# Patient Record
Sex: Male | Born: 1975 | Race: White | Hispanic: No | Marital: Married | Smoking: Former smoker
Health system: Southern US, Community
[De-identification: ages and names within clinical notes are randomized; demographics above are authoritative.]

## PROBLEM LIST (undated history)

## (undated) DIAGNOSIS — E669 Obesity, unspecified: Secondary | ICD-10-CM

## (undated) DIAGNOSIS — K219 Gastro-esophageal reflux disease without esophagitis: Secondary | ICD-10-CM

## (undated) DIAGNOSIS — M779 Enthesopathy, unspecified: Secondary | ICD-10-CM

## (undated) DIAGNOSIS — E119 Type 2 diabetes mellitus without complications: Secondary | ICD-10-CM

## (undated) DIAGNOSIS — S46012A Strain of muscle(s) and tendon(s) of the rotator cuff of left shoulder, initial encounter: Secondary | ICD-10-CM

## (undated) DIAGNOSIS — F419 Anxiety disorder, unspecified: Secondary | ICD-10-CM

## (undated) DIAGNOSIS — M722 Plantar fascial fibromatosis: Secondary | ICD-10-CM

## (undated) DIAGNOSIS — L989 Disorder of the skin and subcutaneous tissue, unspecified: Secondary | ICD-10-CM

## (undated) DIAGNOSIS — I10 Essential (primary) hypertension: Secondary | ICD-10-CM

## (undated) HISTORY — DX: Enthesopathy, unspecified: M77.9

## (undated) HISTORY — PX: VASECTOMY: SHX75

## (undated) HISTORY — DX: Anxiety disorder, unspecified: F41.9

## (undated) HISTORY — DX: Gastro-esophageal reflux disease without esophagitis: K21.9

## (undated) HISTORY — DX: Disorder of the skin and subcutaneous tissue, unspecified: L98.9

## (undated) HISTORY — DX: Plantar fascial fibromatosis: M72.2

## (undated) HISTORY — DX: Obesity, unspecified: E66.9

---

## 1999-05-21 ENCOUNTER — Emergency Department (HOSPITAL_COMMUNITY): Admission: EM | Admit: 1999-05-21 | Discharge: 1999-05-21 | Payer: Self-pay | Admitting: Emergency Medicine

## 1999-05-25 ENCOUNTER — Emergency Department (HOSPITAL_COMMUNITY): Admission: EM | Admit: 1999-05-25 | Discharge: 1999-05-25 | Payer: Self-pay | Admitting: Emergency Medicine

## 2000-12-09 ENCOUNTER — Emergency Department (HOSPITAL_COMMUNITY): Admission: EM | Admit: 2000-12-09 | Discharge: 2000-12-09 | Payer: Self-pay | Admitting: Emergency Medicine

## 2001-03-28 ENCOUNTER — Emergency Department (HOSPITAL_COMMUNITY): Admission: EM | Admit: 2001-03-28 | Discharge: 2001-03-28 | Payer: Self-pay

## 2004-04-23 ENCOUNTER — Emergency Department (HOSPITAL_COMMUNITY): Admission: EM | Admit: 2004-04-23 | Discharge: 2004-04-23 | Payer: Self-pay | Admitting: Emergency Medicine

## 2005-05-03 ENCOUNTER — Emergency Department (HOSPITAL_COMMUNITY): Admission: EM | Admit: 2005-05-03 | Discharge: 2005-05-03 | Payer: Self-pay | Admitting: Emergency Medicine

## 2011-08-25 ENCOUNTER — Emergency Department: Payer: Self-pay | Admitting: *Deleted

## 2012-08-03 ENCOUNTER — Ambulatory Visit: Payer: Self-pay | Admitting: *Deleted

## 2012-08-21 ENCOUNTER — Ambulatory Visit: Payer: Self-pay | Admitting: Dietician

## 2013-11-13 ENCOUNTER — Emergency Department: Payer: Self-pay | Admitting: Internal Medicine

## 2013-11-13 LAB — BASIC METABOLIC PANEL
Anion Gap: 9 (ref 7–16)
BUN: 13 mg/dL (ref 7–18)
Calcium, Total: 8.5 mg/dL (ref 8.5–10.1)
Chloride: 107 mmol/L (ref 98–107)
Co2: 25 mmol/L (ref 21–32)
Creatinine: 1.14 mg/dL (ref 0.60–1.30)
EGFR (African American): >60
EGFR (Non-African Amer.): >60
Glucose: 154 mg/dL — ABNORMAL HIGH (ref 65–99)
Osmolality: 284 (ref 275–301)
Potassium: 3.6 mmol/L (ref 3.5–5.1)
Sodium: 141 mmol/L (ref 136–145)

## 2013-11-13 LAB — CBC
HCT: 44.6 % (ref 40.0–52.0)
HGB: 14.8 g/dL (ref 13.0–18.0)
MCH: 30.6 pg (ref 26.0–34.0)
MCHC: 33.2 g/dL (ref 32.0–36.0)
MCV: 92 fL (ref 80–100)
Platelet: 332 10*3/uL (ref 150–440)
RBC: 4.83 10*6/uL (ref 4.40–5.90)
RDW: 13.4 % (ref 11.5–14.5)
WBC: 9.3 10*3/uL (ref 3.8–10.6)

## 2013-11-13 LAB — TROPONIN I: Troponin-I: <0.02 ng/mL

## 2014-03-05 ENCOUNTER — Encounter: Payer: Self-pay | Admitting: *Deleted

## 2014-07-23 ENCOUNTER — Telehealth: Payer: Self-pay | Admitting: Cardiology

## 2014-07-23 NOTE — Telephone Encounter (Signed)
Received records from [facility] [place] for appointment on 07/25/14 with Dr Antoine PocheHochrein.  [facility] for Dr Hochrein's schedule on 7/716. lp

## 2014-07-25 ENCOUNTER — Encounter: Payer: Self-pay | Admitting: Cardiology

## 2014-07-25 ENCOUNTER — Ambulatory Visit (INDEPENDENT_AMBULATORY_CARE_PROVIDER_SITE_OTHER): Payer: 59 | Admitting: Cardiology

## 2014-07-25 VITALS — BP 120/94 | HR 80 | Ht 73.0 in | Wt 281.0 lb

## 2014-07-25 DIAGNOSIS — Z8241 Family history of sudden cardiac death: Secondary | ICD-10-CM | POA: Insufficient documentation

## 2014-07-25 DIAGNOSIS — Z8249 Family history of ischemic heart disease and other diseases of the circulatory system: Secondary | ICD-10-CM

## 2014-07-25 DIAGNOSIS — I422 Other hypertrophic cardiomyopathy: Secondary | ICD-10-CM

## 2014-07-25 NOTE — Patient Instructions (Signed)
Your physician wants you to follow-up in: 2 Years. You will receive a reminder letter in the mail two months in advance. If you don't receive a letter, please call our office to schedule the follow-up appointment.  Your physician has requested that you have an echocardiogram. Echocardiography is a painless test that uses sound waves to create images of your heart. It provides your doctor with information about the size and shape of your heart and how well your heart's chambers and valves are working. This procedure takes approximately one hour. There are no restrictions for this procedure.

## 2014-07-25 NOTE — Progress Notes (Signed)
Cardiology Office Note   Date:  07/25/2014   ID:  [name] BlowerJeffrey D [name], DOB Jul 17, 1975, MRN [id number]  PCP:  [name] CopasHACKER,[name] KELLER, MD  Cardiologist:   Rollene RotundaJames Taysean Wager, MD   Chief Complaint  Patient presents with  . Family History of HCM    c/o      History of Present Illness: [name] [name] is a 39 y.o. male who presents for evaluation because his son who was 39 years old at the time died suddenly was found to have hypertrophic cardiomyopathy. He has never been screened. His other 3 children have been screened as has his wife. He otherwise is well. He denies any cardiovascular symptoms. He [place] a very active outside [place]. The patient denies any new symptoms such as chest discomfort, neck or arm discomfort. There has been no new shortness of breath, PND or orthopnea. There have been no reported palpitations, presyncope or syncope.  Past Medical History  Diagnosis Date  . Anxiety   . Tendonitis   . GERD (gastroesophageal reflux disease)   . Obesity (BMI 30-39.9)   . Skin lesion   . Plantar fasciitis of right foot     Past Surgical History  Procedure Laterality Date  . Vasectomy       Current Outpatient Prescriptions  Medication Sig Dispense Refill  . sertraline (ZOLOFT) 100 MG tablet Take 200 mg by mouth daily.     No current facility-administered medications for this visit.    Allergies:   Review of patient's allergies indicates not on file.    Social History:  The patient  reports that he has been smoking Cigarettes.  His smokeless tobacco use includes Snuff.   Family History:  The patient's family history includes Diabetes type I in his son; Hypertrophic cardiomyopathy in his son.    ROS:  Please see the history of present illness.   Otherwise, review of systems are positive for none.   All other systems are reviewed and negative.    PHYSICAL EXAM: VS:  BP 120/94 mmHg  Pulse 80  Ht 6\' 1"  (1.854 m)  Wt 281 lb (127.461 kg)  BMI 37.08  kg/m2 , BMI Body mass index is 37.08 kg/(m^2). GENERAL:  Well appearing HEENT:  Pupils equal round and reactive, fundi not visualized, oral mucosa unremarkable NECK:  No jugular venous distention, waveform within normal limits, carotid upstroke brisk and symmetric, no bruits, no thyromegaly LYMPHATICS:  No cervical, inguinal adenopathy LUNGS:  Clear to auscultation bilaterally BACK:  No CVA tenderness CHEST:  Unremarkable HEART:  PMI not displaced or sustained,S1 and S2 within normal limits, no S3, no S4, no clicks, no rubs, no murmurs ABD:  Flat, positive bowel sounds normal in frequency in pitch, no bruits, no rebound, no guarding, no midline pulsatile mass, no hepatomegaly, no splenomegaly EXT:  2 plus pulses throughout, no edema, no cyanosis no clubbing SKIN:  No rashes no nodules NEURO:  Cranial nerves II through XII grossly intact, motor grossly intact throughout PSYCH:  Cognitively intact, oriented to person place and time    EKG:  EKG is not ordered today. The ekg ordered 11/13/13 day demonstrates Sinus rhythm, rate 75, axis within normal limits, intervals within normal limits, no acute ST-T wave changes.    Recent Labs: 11/13/2013: BUN 13; Creatinine 1.14; HGB 14.8; Platelet 332; Potassium 3.6; Sodium 141    Lipid Panel No results found for: CHOL, TRIG, HDL, CHOLHDL, VLDL, LDLCALC, LDLDIRECT    Wt Readings from Last  3 Encounters:  07/25/14 281 lb (127.461 kg)      Other studies Reviewed: Additional studies/ records that were reviewed today include: EKG. Review of the above records demonstrates:  Please see elsewhere in the note.     ASSESSMENT AND PLAN:  FAMILY HISTORY OF HCM:  I will screen the patient with an echocardiogram. Further send the patient for genetic testing.  OVERWEIGHT:  The patient understands the need to lose weight with diet and exercise. We have discussed specific strategies for this.  Current medicines are reviewed at length with the patient  today.  The patient does not have concerns regarding medicines.  The following changes have been made:  no change  Labs/ tests ordered today include:   Orders Placed This Encounter  Procedures  . ECHOCARDIOGRAM COMPLETE     Disposition:   FU with me in two years.     Signed, Rollene Rotunda, MD  07/25/2014 11:52 AM     [facility]

## 2014-07-29 ENCOUNTER — Encounter: Payer: Self-pay | Admitting: Cardiology

## 2014-07-31 ENCOUNTER — Ambulatory Visit (HOSPITAL_COMMUNITY)
Admission: RE | Admit: 2014-07-31 | Discharge: 2014-07-31 | Disposition: A | Discharge status: Home / Self Care | Payer: 59 | Source: Ambulatory Visit | Attending: Cardiology | Admitting: Cardiology

## 2014-07-31 DIAGNOSIS — I351 Nonrheumatic aortic (valve) insufficiency: Secondary | ICD-10-CM | POA: Insufficient documentation

## 2014-07-31 DIAGNOSIS — Z8249 Family history of ischemic heart disease and other diseases of the circulatory system: Secondary | ICD-10-CM | POA: Diagnosis present

## 2014-07-31 DIAGNOSIS — I422 Other hypertrophic cardiomyopathy: Secondary | ICD-10-CM

## 2014-08-07 ENCOUNTER — Telehealth: Payer: Self-pay | Admitting: Cardiology

## 2014-08-07 NOTE — Telephone Encounter (Signed)
Pt called in wanting to know if his results from the Echo he had done on 7/13 were available. Please call  Thanks

## 2014-08-07 NOTE — Telephone Encounter (Signed)
Notified patient of results via VM (OK per DPR) Results routed to PCP  [facility]?

## 2014-08-08 ENCOUNTER — Telehealth: Payer: Self-pay | Admitting: Cardiology

## 2014-08-08 NOTE — Telephone Encounter (Signed)
Error

## 2014-09-08 ENCOUNTER — Telehealth: Payer: Self-pay | Admitting: Cardiology

## 2014-09-08 NOTE — Telephone Encounter (Signed)
Error

## 2014-09-18 ENCOUNTER — Telehealth: Payer: Self-pay | Admitting: *Deleted

## 2014-09-18 NOTE — Telephone Encounter (Signed)
Open chart by accident; wrong patient

## 2014-10-11 ENCOUNTER — Telehealth: Payer: Self-pay | Admitting: Cardiology

## 2014-10-11 NOTE — Telephone Encounter (Signed)
Error

## 2014-12-16 ENCOUNTER — Telehealth: Payer: Self-pay | Admitting: *Deleted

## 2014-12-16 NOTE — Telephone Encounter (Signed)
Dr. Antoine PocheHochrein and I discussed this patient case today. Pt had family member (biological son) die suddenly, a postmortem exam found hypertrophic cardiomyopathy. Pt himself was evaluated. Dr. Nolon RodAndrew Wang w/ [facility] has given pt recommendations. Dr. Antoine PocheHochrein had requested me to follow up on this patient regarding Dr. Juanda ChanceWang's recommendations of annual EKG and echocardiograms for his living children, and same testing every 5 years for the patient and his wife. He wants pt to be aware that we are happy to provide this service for him and his children at the recommended intervals.  I called patient and did not receive answer at either listed home number. Left message at indicated number for patient to call.

## 2014-12-19 NOTE — Telephone Encounter (Signed)
Left msg for patient to call. 

## 2014-12-20 NOTE — Telephone Encounter (Signed)
Spoke to patient. He acknowledges our offer to provided services, states children and wife are already set up w/ other practice for recommended testing. Advised to call if any future needs.

## 2015-03-11 ENCOUNTER — Telehealth: Payer: Self-pay | Admitting: Cardiology

## 2015-03-11 NOTE — Telephone Encounter (Signed)
Pt's wife is calling you back   Thanks

## 2015-03-11 NOTE — Telephone Encounter (Signed)
New message     Youngest son was diagnosed with a genetic heart problem. He had to have a defibulator implanted at 40yrs old. He has a cardiomyopathy problem.  The pediatric cardiologist at [facility] is recommending parents to have an MRI to see which parent is the "carrier".  Is this ok with Dr Antoine Poche for pt to have an MRI?  OK to leave message on vm

## 2015-03-11 NOTE — Telephone Encounter (Signed)
LMTC

## 2015-03-11 NOTE — Telephone Encounter (Signed)
Left message advising pt should be OK to have MRI, would confirm w/ Dr. Antoine Poche & call back w/ advice.

## 2015-03-11 NOTE — Telephone Encounter (Signed)
This was not in the recommendation from Dr. Regino Schultze at the [facility] clinic where I sent this patient to follow up on just this issue.  Dr. Regino Schultze did not mention an MRI.  He did mention echo and EKG q 5 years.  Please ask the patient to call [facility] and Dr. Juanda Chance office to follow up on this recommendation.

## 2015-03-11 NOTE — Telephone Encounter (Signed)
Left [name] a message to call.

## 2015-03-11 NOTE — Telephone Encounter (Signed)
Left msg for wife, left msg for patient, that Dr. Antoine Poche recommends inquiring on this w/ [facility] cardiologist. Requested call back.

## 2016-01-27 DIAGNOSIS — J029 Acute pharyngitis, unspecified: Secondary | ICD-10-CM | POA: Diagnosis not present

## 2016-01-27 DIAGNOSIS — J209 Acute bronchitis, unspecified: Secondary | ICD-10-CM | POA: Diagnosis not present

## 2016-03-19 DIAGNOSIS — M25512 Pain in left shoulder: Secondary | ICD-10-CM | POA: Diagnosis not present

## 2016-03-19 DIAGNOSIS — M79642 Pain in left hand: Secondary | ICD-10-CM | POA: Diagnosis not present

## 2016-04-21 ENCOUNTER — Encounter (HOSPITAL_COMMUNITY): Payer: Self-pay | Admitting: Emergency Medicine

## 2016-04-21 ENCOUNTER — Emergency Department (HOSPITAL_COMMUNITY)
Admission: EM | Admit: 2016-04-21 | Discharge: 2016-04-21 | Disposition: A | Discharge status: Home / Self Care | Payer: Commercial Managed Care - HMO | Attending: Emergency Medicine | Admitting: Emergency Medicine

## 2016-04-21 ENCOUNTER — Emergency Department (HOSPITAL_COMMUNITY): Payer: Commercial Managed Care - HMO

## 2016-04-21 DIAGNOSIS — S46912A Strain of unspecified muscle, fascia and tendon at shoulder and upper arm level, left arm, initial encounter: Secondary | ICD-10-CM

## 2016-04-21 DIAGNOSIS — S46911A Strain of unspecified muscle, fascia and tendon at shoulder and upper arm level, right arm, initial encounter: Secondary | ICD-10-CM | POA: Diagnosis not present

## 2016-04-21 DIAGNOSIS — M62838 Other muscle spasm: Secondary | ICD-10-CM | POA: Diagnosis not present

## 2016-04-21 DIAGNOSIS — Z79899 Other long term (current) drug therapy: Secondary | ICD-10-CM | POA: Diagnosis not present

## 2016-04-21 DIAGNOSIS — Y939 Activity, unspecified: Secondary | ICD-10-CM | POA: Insufficient documentation

## 2016-04-21 DIAGNOSIS — S4992XA Unspecified injury of left shoulder and upper arm, initial encounter: Secondary | ICD-10-CM

## 2016-04-21 DIAGNOSIS — F17210 Nicotine dependence, cigarettes, uncomplicated: Secondary | ICD-10-CM | POA: Insufficient documentation

## 2016-04-21 DIAGNOSIS — M79602 Pain in left arm: Secondary | ICD-10-CM | POA: Diagnosis not present

## 2016-04-21 DIAGNOSIS — Y999 Unspecified external cause status: Secondary | ICD-10-CM | POA: Diagnosis not present

## 2016-04-21 DIAGNOSIS — W010XXA Fall on same level from slipping, tripping and stumbling without subsequent striking against object, initial encounter: Secondary | ICD-10-CM | POA: Insufficient documentation

## 2016-04-21 DIAGNOSIS — Y929 Unspecified place or not applicable: Secondary | ICD-10-CM | POA: Insufficient documentation

## 2016-04-21 DIAGNOSIS — S40912A Unspecified superficial injury of left shoulder, initial encounter: Secondary | ICD-10-CM | POA: Diagnosis not present

## 2016-04-21 MED ORDER — NAPROXEN 375 MG PO TABS: 375.0000 mg | ORAL_TABLET | Freq: Two times a day (BID) | ORAL | 0 refills | Status: DC

## 2016-04-21 MED ORDER — CYCLOBENZAPRINE HCL 10 MG PO TABS: 10.0000 mg | ORAL_TABLET | Freq: Every day | ORAL | 0 refills | Status: AC

## 2016-04-21 MED ORDER — OXYCODONE-ACETAMINOPHEN 5-325 MG PO TABS
1.0000 | ORAL_TABLET | Freq: Once | ORAL | Status: AC
  Administered 2016-04-21: 1 via ORAL

## 2016-04-21 MED ORDER — HYDROCODONE-ACETAMINOPHEN 5-325 MG PO TABS: 1.0000 | ORAL_TABLET | Freq: Three times a day (TID) | ORAL | 0 refills | Status: AC | PRN

## 2016-04-21 MED ORDER — ACETAMINOPHEN 500 MG PO TABS: 1000.0000 mg | ORAL_TABLET | Freq: Three times a day (TID) | ORAL | 0 refills | Status: AC

## 2016-04-21 MED ORDER — OXYCODONE-ACETAMINOPHEN 5-325 MG PO TABS: 1.0000 | ORAL_TABLET | Freq: Once | ORAL | Status: DC

## 2016-04-21 MED ORDER — OXYCODONE-ACETAMINOPHEN 5-325 MG PO TABS
ORAL_TABLET | ORAL | Status: AC
  Filled 2016-04-21: qty 1

## 2016-04-21 NOTE — ED Notes (Signed)
Patient states that entire left arm feels numb all the way to finger tips.  Radial pulse noted.

## 2016-04-21 NOTE — ED Triage Notes (Signed)
Patient missed his step slipped and fell backwards while using a weedeater this evening , denies LOC/ambulatory , reports pain at left shoulder joint with mild swelling , pain increases with movement .

## 2016-04-21 NOTE — ED Provider Notes (Addendum)
[facility]-EMERGENCY DEPT Provider Note   CSN: [id number] Arrival date & time: 04/21/16  1907   By signing my name below, I, Teofilo Pod, attest that this documentation has been prepared under the direction and in the presence of Drema Pry, MD.  Electronically Signed: Teofilo Pod, ED Scribe. 04/21/2016. 8:04 PM.   History   Chief Complaint Chief Complaint  Patient presents with  . Shoulder Injury   The history is provided by the patient. No language interpreter was used.   HPI Comments:  [name] [name] is a 40 y.o. male who presents to the Emergency Department s/p left shoulder injury. Pt reports that he was weed-whacking and fell backwards after slipping on a step, and braced his fall with his left hand and elbow. He complains of constant pain to the left shoulder since the fall that is worse with movement. He did not feel any dislocation. He denies any head injury or LOC. No alleviating factors noted. Pt denies pain elsewhere, and other associated symptoms.   Past Medical History:  Diagnosis Date  . Anxiety   . GERD (gastroesophageal reflux disease)   . Obesity (BMI 30-39.9)   . Plantar fasciitis of right foot   . Skin lesion   . Tendonitis     Patient Active Problem List   Diagnosis Date Noted  . Family history of hypertrophic cardiomyopathy 08-19-2014  . Family history of sudden cardiac death in son Aug 19, 2014    Past Surgical History:  Procedure Laterality Date  . VASECTOMY         Home Medications    Prior to Admission medications   Medication Sig Start Date End Date Taking? Authorizing Provider  acetaminophen (TYLENOL) 500 MG tablet Take 2 tablets (1,000 mg total) by mouth every 8 (eight) hours. Do not take more than 4000 mg of acetaminophen (Tylenol) in a 24-hour period. Please note that other medicines that you may be prescribed may have Tylenol as well. 04/21/16 04/26/16  Nira Conn, MD  cyclobenzaprine (FLEXERIL) 10 MG tablet Take 1  tablet (10 mg total) by mouth at bedtime. 04/21/16 05/01/16  Nira Conn, MD  HYDROcodone-acetaminophen (NORCO/VICODIN) 5-325 MG tablet Take 1 tablet by mouth every 8 (eight) hours as needed for severe pain (That is not improved by your scheduled acetaminophen regimen). Please do not exceed 4000 mg of acetaminophen (Tylenol) a 24-hour period. Please note that he may be prescribed additional medicine that contains acetaminophen. 04/21/16 04/26/16  Nira Conn, MD  naproxen (NAPROSYN) 375 MG tablet Take 1 tablet (375 mg total) by mouth 2 (two) times daily. 04/21/16   Nira Conn, MD  sertraline (ZOLOFT) 100 MG tablet Take 200 mg by mouth daily.    Historical Provider, MD    Family History Family History  Problem Relation Age of Onset  . Hypertrophic cardiomyopathy Son   . Diabetes type I Son     Social History Social History  Substance Use Topics  . Smoking status: Current Some Day Smoker    Types: Cigarettes  . Smokeless tobacco: Current User    Types: Snuff  . Alcohol use Not on file     Allergies   Patient has no known allergies.   Review of Systems Review of Systems 10 Systems reviewed and are negative for acute change except as noted in the HPI.   Physical Exam Updated Vital Signs BP (!) 136/95 (BP Location: Right Arm)   Pulse (!) 106   Temp 97.9 F (36.6 C) (Oral)  Resp 16   SpO2 96%   Physical Exam  Constitutional: He is oriented to person, place, and time. He appears well-developed and well-nourished. No distress.  HENT:  Head: Normocephalic.  Right Ear: External ear normal.  Left Ear: External ear normal.  Mouth/Throat: Oropharynx is clear and moist.  Eyes: Conjunctivae and EOM are normal. Pupils are equal, round, and reactive to light. Right eye exhibits no discharge. Left eye exhibits no discharge. No scleral icterus.  Neck: Normal range of motion. Neck supple.  Cardiovascular: Regular rhythm and normal heart sounds.  Exam reveals no  gallop and no friction rub.   No murmur heard. Pulses:      Radial pulses are 2+ on the right side, and 2+ on the left side.       Dorsalis pedis pulses are 2+ on the right side, and 2+ on the left side.  Pulmonary/Chest: Effort normal and breath sounds normal. No stridor. No respiratory distress.  Abdominal: Soft. He exhibits no distension. There is no tenderness.  Musculoskeletal:       Left shoulder: He exhibits decreased range of motion (due to pain), tenderness (over deltoid) and spasm (of deltoid muscle). He exhibits no bony tenderness, no deformity, normal pulse and normal strength.       Cervical back: He exhibits no bony tenderness.       Thoracic back: He exhibits no bony tenderness.       Lumbar back: He exhibits no bony tenderness.  Clavicle stable. Chest stable to AP/Lat compression. Pelvis stable to Lat compression. No obvious extremity deformity. No chest or abdominal wall contusion.  Neurological: He is alert and oriented to person, place, and time. GCS eye subscore is 4. GCS verbal subscore is 5. GCS motor subscore is 6.  Moving all extremities   Skin: Skin is warm. He is not diaphoretic.     ED Treatments / Results  DIAGNOSTIC STUDIES:  Oxygen Saturation is 96% on RA, normal by my interpretation.    COORDINATION OF CARE:  7:52 PM Will order x-ray. Discussed treatment plan with pt at bedside and pt agreed to plan.   Labs (all labs ordered are listed, but only abnormal results are displayed) Labs Reviewed - No data to display  EKG  EKG Interpretation None       Radiology Dg Shoulder Left  Result Date: 04/21/2016 CLINICAL DATA:  Patient fell landing on left arm. Lateral aspect of proximal humeral pain. EXAM: LEFT SHOULDER - 2+ VIEW COMPARISON:  None. FINDINGS: There is no evidence of fracture or dislocation. AC and glenohumeral joints are maintained. The adjacent ribs and lung are nonacute in appearance. There is no evidence of arthropathy or other focal  bone abnormality. Soft tissues are unremarkable. IMPRESSION: No acute fracture or malalignment of the left shoulder. Electronically Signed   By: Tollie Eth M.D.   On: 04/21/2016 20:17    Procedures Procedures (including critical care time)  Medications Ordered in ED Medications  oxyCODONE-acetaminophen (PERCOCET/ROXICET) 5-325 MG per tablet 1 tablet (1 tablet Oral Given 04/21/16 1945)     Initial Impression / Assessment and Plan / ED Course  I have reviewed the triage vital signs and the nursing notes.  Pertinent labs & imaging results that were available during my care of the patient were reviewed by me and considered in my medical decision making (see chart for details).     Plain film without evidence of fracture or dislocation. Likely right shoulder strain with associated muscle spasm. Also possibility for  rotator cuff injury. We'll place patient in a sling and have patient follow-up with orthopedic surgery. Patient already has an orthopedic surgery and they will call to set up an appointment.  The patient is safe for discharge with strict return precautions.   Final Clinical Impressions(s) / ED Diagnoses   Final diagnoses:  Muscle spasm of left shoulder area  Injury of left shoulder, initial encounter  Strain of left shoulder, initial encounter   Disposition: Discharge  Condition: Good  I have discussed the results, Dx and Tx plan with the patient who expressed understanding and agree(s) with the plan. Discharge instructions discussed at great length. The patient was given strict return precautions who verbalized understanding of the instructions. No further questions at time of discharge.    New Prescriptions   ACETAMINOPHEN (TYLENOL) 500 MG TABLET    Take 2 tablets (1,000 mg total) by mouth every 8 (eight) hours. Do not take more than 4000 mg of acetaminophen (Tylenol) in a 24-hour period. Please note that other medicines that you may be prescribed may have Tylenol as  well.   CYCLOBENZAPRINE (FLEXERIL) 10 MG TABLET    Take 1 tablet (10 mg total) by mouth at bedtime.   HYDROCODONE-ACETAMINOPHEN (NORCO/VICODIN) 5-325 MG TABLET    Take 1 tablet by mouth every 8 (eight) hours as needed for severe pain (That is not improved by your scheduled acetaminophen regimen). Please do not exceed 4000 mg of acetaminophen (Tylenol) a 24-hour period. Please note that he may be prescribed additional medicine that contains acetaminophen.   NAPROXEN (NAPROSYN) 375 MG TABLET    Take 1 tablet (375 mg total) by mouth 2 (two) times daily.    Follow Up: Orthopedic Surgery   in 1-2 weeks for reassessment of your shoulder injury   I personally performed the services described in this documentation, which was scribed in my presence. The recorded information has been reviewed and is accurate.        Nira Conn, MD 04/21/16 [phone]

## 2016-04-23 DIAGNOSIS — M75112 Incomplete rotator cuff tear or rupture of left shoulder, not specified as traumatic: Secondary | ICD-10-CM | POA: Diagnosis not present

## 2016-04-23 DIAGNOSIS — M542 Cervicalgia: Secondary | ICD-10-CM | POA: Diagnosis not present

## 2016-04-30 DIAGNOSIS — M75112 Incomplete rotator cuff tear or rupture of left shoulder, not specified as traumatic: Secondary | ICD-10-CM | POA: Diagnosis not present

## 2016-05-13 DIAGNOSIS — M25512 Pain in left shoulder: Secondary | ICD-10-CM | POA: Diagnosis not present

## 2016-05-17 DIAGNOSIS — M25512 Pain in left shoulder: Secondary | ICD-10-CM | POA: Diagnosis not present

## 2016-05-19 ENCOUNTER — Encounter (HOSPITAL_BASED_OUTPATIENT_CLINIC_OR_DEPARTMENT_OTHER): Payer: Self-pay | Admitting: *Deleted

## 2016-05-20 ENCOUNTER — Encounter (HOSPITAL_BASED_OUTPATIENT_CLINIC_OR_DEPARTMENT_OTHER): Payer: Self-pay | Admitting: Physician Assistant

## 2016-05-20 DIAGNOSIS — S46012A Strain of muscle(s) and tendon(s) of the rotator cuff of left shoulder, initial encounter: Secondary | ICD-10-CM

## 2016-05-20 HISTORY — DX: Strain of muscle(s) and tendon(s) of the rotator cuff of left shoulder, initial encounter: S46.012A

## 2016-05-20 NOTE — H&P (Signed)
[name] BlowerJeffrey D [name] is an 41 y.o. male.   Chief Complaint: left shoulder rotator cuff tear HPI: this patient fell on 04-21-16 while weed eating.  He fell on an outstrectched arm   Since his fall he has been unable to raise his arm.  He has tried pain medication and a cortisone shot.  MRI shows large retracted rotator cuff tear with no atrophy.  Past Medical History:  Diagnosis Date  . Anxiety   . GERD (gastroesophageal reflux disease)   . Obesity (BMI 30-39.9)   . Plantar fasciitis of right foot   . Skin lesion   . Tendonitis   . Traumatic tear of left rotator cuff 05/20/2016    Past Surgical History:  Procedure Laterality Date  . VASECTOMY      Family History  Problem Relation Age of Onset  . Hypertrophic cardiomyopathy Son   . Diabetes type I Son    Social History:  reports that he has been smoking Cigarettes.  His smokeless tobacco use includes Snuff. He reports that he drinks alcohol. He reports that he does not use drugs.  Allergies: No Known Allergies  No prescriptions prior to admission.    No results found for this or any previous visit (from the past 48 hour(s)). No results found.  Review of Systems  Constitutional: Negative.   HENT: Negative.   Eyes: Negative.   Respiratory: Negative.   Cardiovascular: Negative.   Gastrointestinal: Negative.   Genitourinary: Negative.   Musculoskeletal: Positive for joint pain.  Neurological: Negative.   Endo/Heme/Allergies: Negative.   Psychiatric/Behavioral: Negative.     Height 6\' 1"  (1.854 m), weight 129.3 kg (285 lb). Physical Exam  Constitutional: He is oriented to person, place, and time. He appears well-developed and well-nourished.  HENT:  Head: Normocephalic and atraumatic.  Eyes: Conjunctivae are normal.  Neck: Neck supple.  Cardiovascular: Normal rate.   Respiratory: Effort normal.  GI: Soft.  Genitourinary:  Genitourinary Comments: Not pertinent to current symptomatology therefore not examined.   Musculoskeletal:  Evaluation of his left shoulder reveals no discrete tenderness to palpation. He has very limited range of motion with flexion to about 30 degrees, abduction to 75 degrees, internal rotation to his iliac crest. He has decreased strength in his rotator cuff throughout and he is neurovascularly intact distally. Grip strength is equal bilaterally.   Neurological: He is alert and oriented to person, place, and time.  Skin: Skin is warm and dry.  Psychiatric: He has a normal mood and affect. His behavior is normal.     Assessment Principal Problem:   Traumatic tear of left rotator cuff Active Problems:   Family history of hypertrophic cardiomyopathy   Family history of sudden cardiac death in son   Plan I have talked to him about this in detail.  We recommend with these findings that we proceed with left shoulder arthroscopy with rotator cuff repair.  Risks, complications and benefits of the surgery were described to him in detail and he understands this completely.    Pascal LuxSHEPPERSON,Rhian Funari J, PA-C 05/20/2016, 10:52 AM

## 2016-05-21 ENCOUNTER — Encounter (HOSPITAL_BASED_OUTPATIENT_CLINIC_OR_DEPARTMENT_OTHER)
Admission: RE | Disposition: A | Discharge status: Home / Self Care | Payer: Self-pay | Source: Ambulatory Visit | Attending: Orthopedic Surgery

## 2016-05-21 ENCOUNTER — Ambulatory Visit (HOSPITAL_BASED_OUTPATIENT_CLINIC_OR_DEPARTMENT_OTHER): Payer: Commercial Managed Care - HMO | Admitting: Certified Registered"

## 2016-05-21 ENCOUNTER — Encounter (HOSPITAL_BASED_OUTPATIENT_CLINIC_OR_DEPARTMENT_OTHER): Payer: Self-pay | Admitting: Anesthesiology

## 2016-05-21 ENCOUNTER — Ambulatory Visit (HOSPITAL_BASED_OUTPATIENT_CLINIC_OR_DEPARTMENT_OTHER)
Admission: RE | Admit: 2016-05-21 | Discharge: 2016-05-21 | Disposition: A | Discharge status: Home / Self Care | Payer: Commercial Managed Care - HMO | Source: Ambulatory Visit | Attending: Orthopedic Surgery | Admitting: Orthopedic Surgery

## 2016-05-21 DIAGNOSIS — S46012A Strain of muscle(s) and tendon(s) of the rotator cuff of left shoulder, initial encounter: Secondary | ICD-10-CM | POA: Diagnosis present

## 2016-05-21 DIAGNOSIS — W19XXXA Unspecified fall, initial encounter: Secondary | ICD-10-CM | POA: Diagnosis not present

## 2016-05-21 DIAGNOSIS — G8918 Other acute postprocedural pain: Secondary | ICD-10-CM | POA: Diagnosis not present

## 2016-05-21 DIAGNOSIS — M75102 Unspecified rotator cuff tear or rupture of left shoulder, not specified as traumatic: Secondary | ICD-10-CM | POA: Diagnosis present

## 2016-05-21 DIAGNOSIS — F419 Anxiety disorder, unspecified: Secondary | ICD-10-CM | POA: Insufficient documentation

## 2016-05-21 DIAGNOSIS — Z9852 Vasectomy status: Secondary | ICD-10-CM | POA: Insufficient documentation

## 2016-05-21 DIAGNOSIS — M722 Plantar fascial fibromatosis: Secondary | ICD-10-CM | POA: Insufficient documentation

## 2016-05-21 DIAGNOSIS — Z8249 Family history of ischemic heart disease and other diseases of the circulatory system: Secondary | ICD-10-CM

## 2016-05-21 DIAGNOSIS — Z8241 Family history of sudden cardiac death: Secondary | ICD-10-CM

## 2016-05-21 DIAGNOSIS — E669 Obesity, unspecified: Secondary | ICD-10-CM | POA: Diagnosis not present

## 2016-05-21 DIAGNOSIS — K219 Gastro-esophageal reflux disease without esophagitis: Secondary | ICD-10-CM | POA: Insufficient documentation

## 2016-05-21 DIAGNOSIS — F17210 Nicotine dependence, cigarettes, uncomplicated: Secondary | ICD-10-CM | POA: Diagnosis not present

## 2016-05-21 DIAGNOSIS — M7542 Impingement syndrome of left shoulder: Secondary | ICD-10-CM | POA: Diagnosis not present

## 2016-05-21 DIAGNOSIS — Z833 Family history of diabetes mellitus: Secondary | ICD-10-CM | POA: Diagnosis not present

## 2016-05-21 DIAGNOSIS — S43432A Superior glenoid labrum lesion of left shoulder, initial encounter: Secondary | ICD-10-CM | POA: Diagnosis not present

## 2016-05-21 DIAGNOSIS — Z6834 Body mass index (BMI) 34.0-34.9, adult: Secondary | ICD-10-CM | POA: Diagnosis not present

## 2016-05-21 DIAGNOSIS — M19012 Primary osteoarthritis, left shoulder: Secondary | ICD-10-CM | POA: Diagnosis not present

## 2016-05-21 DIAGNOSIS — M24112 Other articular cartilage disorders, left shoulder: Secondary | ICD-10-CM | POA: Diagnosis not present

## 2016-05-21 HISTORY — DX: Strain of muscle(s) and tendon(s) of the rotator cuff of left shoulder, initial encounter: S46.012A

## 2016-05-21 HISTORY — PX: SHOULDER ARTHROSCOPY WITH ROTATOR CUFF REPAIR AND SUBACROMIAL DECOMPRESSION: SHX5686

## 2016-05-21 SURGERY — SHOULDER ARTHROSCOPY WITH ROTATOR CUFF REPAIR AND SUBACROMIAL DECOMPRESSION
Anesthesia: General | Site: Shoulder | Laterality: Left

## 2016-05-21 MED ORDER — MIDAZOLAM HCL 2 MG/2ML IJ SOLN
INTRAMUSCULAR | Status: AC
  Filled 2016-05-21: qty 2

## 2016-05-21 MED ORDER — PROPOFOL 10 MG/ML IV BOLUS
INTRAVENOUS | Status: DC | PRN
  Administered 2016-05-21: 200 mg via INTRAVENOUS

## 2016-05-21 MED ORDER — FENTANYL CITRATE (PF) 100 MCG/2ML IJ SOLN
50.0000 ug | INTRAMUSCULAR | Status: AC | PRN
  Administered 2016-05-21: 50 ug via INTRAVENOUS
  Administered 2016-05-21: 100 ug via INTRAVENOUS
  Administered 2016-05-21: 50 ug via INTRAVENOUS

## 2016-05-21 MED ORDER — SUCCINYLCHOLINE CHLORIDE 20 MG/ML IJ SOLN
INTRAMUSCULAR | Status: DC | PRN
  Administered 2016-05-21: 100 mg via INTRAVENOUS

## 2016-05-21 MED ORDER — FENTANYL CITRATE (PF) 100 MCG/2ML IJ SOLN
INTRAMUSCULAR | Status: AC
  Filled 2016-05-21: qty 2

## 2016-05-21 MED ORDER — CHLORHEXIDINE GLUCONATE 4 % EX LIQD
60.0000 mL | Freq: Once | CUTANEOUS | Status: DC
Start: 2016-05-21 — End: 2016-05-21

## 2016-05-21 MED ORDER — HYDROMORPHONE HCL 2 MG PO TABS: ORAL_TABLET | ORAL | 0 refills | Status: DC

## 2016-05-21 MED ORDER — DEXAMETHASONE SODIUM PHOSPHATE 4 MG/ML IJ SOLN
INTRAMUSCULAR | Status: DC | PRN
  Administered 2016-05-21: 10 mg via INTRAVENOUS

## 2016-05-21 MED ORDER — ONDANSETRON HCL 4 MG/2ML IJ SOLN
INTRAMUSCULAR | Status: DC | PRN
  Administered 2016-05-21: 4 mg via INTRAVENOUS

## 2016-05-21 MED ORDER — LACTATED RINGERS IV SOLN
INTRAVENOUS | Status: DC
  Administered 2016-05-21: 10 mL/h via INTRAVENOUS

## 2016-05-21 MED ORDER — HYDROMORPHONE HCL 1 MG/ML IJ SOLN
0.2500 mg | INTRAMUSCULAR | Status: DC | PRN
  Administered 2016-05-21: 0.5 mg via INTRAVENOUS

## 2016-05-21 MED ORDER — HYDROMORPHONE HCL 1 MG/ML IJ SOLN
INTRAMUSCULAR | Status: AC
  Filled 2016-05-21: qty 1

## 2016-05-21 MED ORDER — DEXTROSE 5 % IV SOLN
3.0000 g | INTRAVENOUS | Status: AC
  Administered 2016-05-21: 3 g via INTRAVENOUS

## 2016-05-21 MED ORDER — BUPIVACAINE-EPINEPHRINE (PF) 0.5% -1:200000 IJ SOLN
INTRAMUSCULAR | Status: DC | PRN
  Administered 2016-05-21: 30 mL via PERINEURAL

## 2016-05-21 MED ORDER — LACTATED RINGERS IV SOLN
INTRAVENOUS | Status: DC
  Administered 2016-05-21: 09:00:00 via INTRAVENOUS
  Administered 2016-05-21: 20 mL via INTRAVENOUS

## 2016-05-21 MED ORDER — CEFAZOLIN SODIUM-DEXTROSE 2-4 GM/100ML-% IV SOLN
INTRAVENOUS | Status: AC
  Filled 2016-05-21: qty 200

## 2016-05-21 MED ORDER — SCOPOLAMINE 1 MG/3DAYS TD PT72: 1.0000 | MEDICATED_PATCH | Freq: Once | TRANSDERMAL | Status: DC | PRN

## 2016-05-21 MED ORDER — POVIDONE-IODINE 7.5 % EX SOLN: Freq: Once | CUTANEOUS | Status: DC

## 2016-05-21 MED ORDER — MEPERIDINE HCL 25 MG/ML IJ SOLN: 6.2500 mg | INTRAMUSCULAR | Status: DC | PRN

## 2016-05-21 MED ORDER — MIDAZOLAM HCL 2 MG/2ML IJ SOLN
1.0000 mg | INTRAMUSCULAR | Status: DC | PRN
  Administered 2016-05-21: 2 mg via INTRAVENOUS

## 2016-05-21 MED ORDER — HYDROCODONE-ACETAMINOPHEN 7.5-325 MG PO TABS: 1.0000 | ORAL_TABLET | Freq: Once | ORAL | Status: DC | PRN

## 2016-05-21 MED ORDER — PROMETHAZINE HCL 25 MG/ML IJ SOLN: 6.2500 mg | INTRAMUSCULAR | Status: DC | PRN

## 2016-05-21 MED ORDER — CYCLOBENZAPRINE HCL 5 MG PO TABS: 5.0000 mg | ORAL_TABLET | Freq: Three times a day (TID) | ORAL | 0 refills | Status: DC | PRN

## 2016-05-21 MED ORDER — LIDOCAINE HCL (CARDIAC) 20 MG/ML IV SOLN
INTRAVENOUS | Status: DC | PRN
  Administered 2016-05-21: 30 mg via INTRAVENOUS

## 2016-05-21 SURGICAL SUPPLY — 76 items
BENZOIN TINCTURE PRP APPL 2/3 (GAUZE/BANDAGES/DRESSINGS) IMPLANT
BLADE CUDA 5.5 (BLADE) IMPLANT
BLADE CUTTER GATOR 3.5 (BLADE) ×4 IMPLANT
BLADE GREAT WHITE 4.2 (BLADE) IMPLANT
BLADE GREAT WHITE 4.2MM (BLADE)
BNDG COHESIVE 4X5 TAN STRL (GAUZE/BANDAGES/DRESSINGS) ×4 IMPLANT
BUR OVAL 6.0 (BURR) ×4 IMPLANT
CANNULA TWIST IN 8.25X7CM (CANNULA) ×8 IMPLANT
CLOSURE WOUND 1/2 X4 (GAUZE/BANDAGES/DRESSINGS)
DECANTER SPIKE VIAL GLASS SM (MISCELLANEOUS) IMPLANT
DRAPE SHOULDER BEACH CHAIR (DRAPES) ×4 IMPLANT
DRAPE U-SHAPE 47X51 STRL (DRAPES) ×8 IMPLANT
DRSG PAD ABDOMINAL 8X10 ST (GAUZE/BANDAGES/DRESSINGS) ×4 IMPLANT
DURAPREP 26ML APPLICATOR (WOUND CARE) ×4 IMPLANT
ELECT REM PT RETURN 9FT ADLT (ELECTROSURGICAL) ×4
ELECTRODE REM PT RTRN 9FT ADLT (ELECTROSURGICAL) ×2 IMPLANT
GAUZE SPONGE 4X4 12PLY STRL (GAUZE/BANDAGES/DRESSINGS) ×4 IMPLANT
GAUZE XEROFORM 1X8 LF (GAUZE/BANDAGES/DRESSINGS) ×4 IMPLANT
GLOVE BIO SURGEON STRL SZ 6.5 (GLOVE) ×6 IMPLANT
GLOVE BIO SURGEON STRL SZ7 (GLOVE) ×4 IMPLANT
GLOVE BIO SURGEONS STRL SZ 6.5 (GLOVE) ×2
GLOVE BIOGEL PI IND STRL 7.0 (GLOVE) ×6 IMPLANT
GLOVE BIOGEL PI IND STRL 7.5 (GLOVE) ×2 IMPLANT
GLOVE BIOGEL PI INDICATOR 7.0 (GLOVE) ×6
GLOVE BIOGEL PI INDICATOR 7.5 (GLOVE) ×2
GLOVE SS BIOGEL STRL SZ 7.5 (GLOVE) ×2 IMPLANT
GLOVE SUPERSENSE BIOGEL SZ 7.5 (GLOVE) ×2
GOWN STRL REUS W/ TWL LRG LVL3 (GOWN DISPOSABLE) ×6 IMPLANT
GOWN STRL REUS W/TWL LRG LVL3 (GOWN DISPOSABLE) ×6
IMP SYSTEM BRIDGE 4.75X19.1 (Anchor) ×4 IMPLANT
IMPL SYSTEM BRIDGE 4.75X19.1 (Anchor) ×2 IMPLANT
LOOP 2 FIBERLINK CLOSED (SUTURE) ×4 IMPLANT
MANIFOLD NEPTUNE II (INSTRUMENTS) ×4 IMPLANT
NDL SAFETY ECLIPSE 18X1.5 (NEEDLE) ×2 IMPLANT
NDL SUT 6 .5 CRC .975X.05 MAYO (NEEDLE) IMPLANT
NEEDLE 1/2 CIR CATGUT .05X1.09 (NEEDLE) IMPLANT
NEEDLE HYPO 18GX1.5 SHARP (NEEDLE) ×2
NEEDLE MAYO TAPER (NEEDLE)
NEEDLE SCORPION MULTI FIRE (NEEDLE) ×4 IMPLANT
PACK ARTHROSCOPY DSU (CUSTOM PROCEDURE TRAY) ×4 IMPLANT
PACK BASIN DAY SURGERY FS (CUSTOM PROCEDURE TRAY) ×4 IMPLANT
PAD ALCOHOL SWAB (MISCELLANEOUS) ×8 IMPLANT
PENCIL BUTTON HOLSTER BLD 10FT (ELECTRODE) IMPLANT
RESTRAINT HEAD UNIVERSAL NS (MISCELLANEOUS) ×4 IMPLANT
SET ARTHROSCOPY TUBING (MISCELLANEOUS) ×2
SET ARTHROSCOPY TUBING LN (MISCELLANEOUS) ×2 IMPLANT
SHEET MEDIUM DRAPE 40X70 STRL (DRAPES) ×4 IMPLANT
SLEEVE SCD COMPRESS KNEE MED (MISCELLANEOUS) IMPLANT
SLING ARM FOAM STRAP LRG (SOFTGOODS) IMPLANT
SLING ARM IMMOBILIZER MED (SOFTGOODS) IMPLANT
SLING ARM MED ADULT FOAM STRAP (SOFTGOODS) IMPLANT
SLING ARM XL FOAM STRAP (SOFTGOODS) IMPLANT
SLING ULTRA III MED (ORTHOPEDIC SUPPLIES) IMPLANT
SPONGE LAP 4X18 X RAY DECT (DISPOSABLE) IMPLANT
STRIP CLOSURE SKIN 1/2X4 (GAUZE/BANDAGES/DRESSINGS) IMPLANT
SUT ETHILON 3 0 PS 1 (SUTURE) ×4 IMPLANT
SUT FIBERWIRE #2 38 T-5 BLUE (SUTURE)
SUT PDS AB 2-0 CT2 27 (SUTURE) IMPLANT
SUT PROLENE 3 0 PS 2 (SUTURE) IMPLANT
SUT TIGER TAPE 7 IN WHITE (SUTURE) IMPLANT
SUT VIC AB 0 SH 27 (SUTURE) IMPLANT
SUT VIC AB 2-0 PS2 27 (SUTURE) IMPLANT
SUT VIC AB 2-0 SH 27 (SUTURE)
SUT VIC AB 2-0 SH 27XBRD (SUTURE) IMPLANT
SUTURE FIBERWR #2 38 T-5 BLUE (SUTURE) IMPLANT
SYR 5ML LL (SYRINGE) ×4 IMPLANT
SYR BULB 3OZ (MISCELLANEOUS) IMPLANT
TAPE FIBER 2MM 7IN #2 BLUE (SUTURE) IMPLANT
TAPE HYPAFIX 6 X30' (GAUZE/BANDAGES/DRESSINGS)
TAPE HYPAFIX 6X30 (GAUZE/BANDAGES/DRESSINGS) IMPLANT
TAPE STRIPS DRAPE STRL (GAUZE/BANDAGES/DRESSINGS) ×4 IMPLANT
TOWEL OR 17X24 6PK STRL BLUE (TOWEL DISPOSABLE) ×4 IMPLANT
TUBE CONNECTING 20'X1/4 (TUBING)
TUBE CONNECTING 20X1/4 (TUBING) IMPLANT
WAND STAR VAC 90 (SURGICAL WAND) ×4 IMPLANT
WATER STERILE IRR 1000ML POUR (IV SOLUTION) ×4 IMPLANT

## 2016-05-21 NOTE — Anesthesia Postprocedure Evaluation (Signed)
Anesthesia Post Note  Patient: Andreas BlowerJeffrey D Konitzer  Procedure(s) Performed: Procedure(s) (LRB): SHOULDER ARTHROSCOPY WITH ROTATOR CUFF REPAIR AND SUBACROMIAL DECOMPRESSION LABRAL DEBRIDEMENT (Left)  Patient location during evaluation: PACU Anesthesia Type: General Level of consciousness: awake and alert Pain management: pain level controlled Vital Signs Assessment: post-procedure vital signs reviewed and stable Respiratory status: spontaneous breathing, nonlabored ventilation, respiratory function stable and patient connected to nasal cannula oxygen Cardiovascular status: blood pressure returned to baseline and stable Postop Assessment: no signs of nausea or vomiting Anesthetic complications: no       Last Vitals:  Vitals:   05/21/16 1230 05/21/16 1330  BP: 129/87 137/89  Pulse: 71 64  Resp: 12 18  Temp:  36.4 C    Last Pain:  Vitals:   05/21/16 1330  TempSrc:   PainSc: 2                  Cecile HearingStephen Edward Turk

## 2016-05-21 NOTE — Transfer of Care (Signed)
Immediate Anesthesia Transfer of Care Note  Patient: [name] BlowerJeffrey D [name]  Procedure(s) Performed: Procedure(s): SHOULDER ARTHROSCOPY WITH ROTATOR CUFF REPAIR AND SUBACROMIAL DECOMPRESSION LABRAL DEBRIDEMENT (Left)  Patient Location: PACU  Anesthesia Type:GA combined with regional for post-op pain  Level of Consciousness: sedated  Airway & Oxygen Therapy: Patient Spontanous Breathing and Patient connected to face mask oxygen  Post-op Assessment: Report given to RN and Post -op Vital signs reviewed and stable  Post vital signs: Reviewed and stable  Last Vitals:  Vitals:   05/21/16 0748 05/21/16 1052  BP: 122/81   Pulse: 82 71  Resp: 19 17    Last Pain:  Vitals:   05/21/16 0723  TempSrc: Oral         Complications: No apparent anesthesia complications

## 2016-05-21 NOTE — Discharge Instructions (Signed)
Regional Anesthesia Blocks ? ?1. Numbness or the inability to move the "blocked" extremity may last from 3-48 hours after placement. The length of time depends on the medication injected and your individual response to the medication. If the numbness is not going away after 48 hours, call your surgeon. ? ?2. The extremity that is blocked will need to be protected until the numbness is gone and the  Strength has returned. Because you cannot feel it, you will need to take extra care to avoid injury. Because it may be weak, you may have difficulty moving it or using it. You may not know what position it is in without looking at it while the block is in effect. ? ?3. For blocks in the legs and feet, returning to weight bearing and walking needs to be done carefully. You will need to wait until the numbness is entirely gone and the strength has returned. You should be able to move your leg and foot normally before you try and bear weight or walk. You will need someone to be with you when you first try to ensure you do not fall and possibly risk injury. ? ?4. Bruising and tenderness at the needle site are common side effects and will resolve in a few days. ? ?5. Persistent numbness or new problems with movement should be communicated to the surgeon or the [facility] ([phone])/ [facility] ([phone]).  ? ?Post Anesthesia Home Care Instructions ? ?Activity: ?Get plenty of rest for the remainder of the day. A responsible individual must stay with you for 24 hours following the procedure.  ?For the next 24 hours, DO NOT: ?-Drive a car ?-Operate machinery ?-Drink alcoholic beverages ?-Take any medication unless instructed by your physician ?-Make any legal decisions or sign important papers. ? ?Meals: ?Start with liquid foods such as gelatin or soup. Progress to regular foods as tolerated. Avoid greasy, spicy, heavy foods. If nausea and/or vomiting occur, drink only clear liquids until the  nausea and/or vomiting subsides. Call your physician if vomiting continues. ? ?Special Instructions/Symptoms: ?Your throat may feel dry or sore from the anesthesia or the breathing tube placed in your throat during surgery. If this causes discomfort, gargle with warm salt water. The discomfort should disappear within 24 hours. ? ?If you had a scopolamine patch placed behind your ear for the management of post- operative nausea and/or vomiting: ? ?1. The medication in the patch is effective for 72 hours, after which it should be removed.  Wrap patch in a tissue and discard in the trash. Wash hands thoroughly with soap and water. ?2. You may remove the patch earlier than 72 hours if you experience unpleasant side effects which may include dry mouth, dizziness or visual disturbances. ?3. Avoid touching the patch. Wash your hands with soap and water after contact with the patch. ?    ?

## 2016-05-21 NOTE — Interval H&P Note (Signed)
History and Physical Interval Note:  05/21/2016 8:53 AM  [name] [name]  has presented today for surgery, with the diagnosis of LEFT SHOULDER OSTEOARTHRITIS, IMPINGEMENT, SYNDROME ,STAIN OF MUSCLES AND TENDONS OF THE ROTATOR CUFF  The various methods of treatment have been discussed with the patient and family. After consideration of risks, benefits and other options for treatment, the patient has consented to  Procedure(s): LEFT SHOULDE SCOPE DEBRIDEMENT, ACROMIOPLASTY, DISTAL CLAVICAL EXCISION,ROTATOR CUFF REPAIR (Left) as a surgical intervention .  The patient's history has been reviewed, patient examined, no change in status, stable for surgery.  I have reviewed the patient's chart and labs.  Questions were answered to the patient's satisfaction.     Salvatore MarvelWAINER,Clevester Helzer A

## 2016-05-21 NOTE — Anesthesia Preprocedure Evaluation (Addendum)
Anesthesia Evaluation  Patient identified by MRN, date of birth, ID band Patient awake    Reviewed: Allergy & Precautions, NPO status , Patient's Chart, lab work & pertinent test results  Airway Mallampati: III       Dental no notable dental hx. (+) Missing,    Pulmonary Current Smoker,    Pulmonary exam normal breath sounds clear to auscultation       Cardiovascular negative cardio ROS Normal cardiovascular exam Rhythm:Regular Rate:Normal     Neuro/Psych Anxiety negative neurological ROS     GI/Hepatic Neg liver ROS, GERD  ,  Endo/Other  Obesity  Renal/GU negative Renal ROS  negative genitourinary   Musculoskeletal OA left shoulder with impingement syndrome Torn rotator cuff left shoulder   Abdominal (+) + obese,   Peds  Hematology negative hematology ROS (+)   Anesthesia Other Findings   Reproductive/Obstetrics                            Anesthesia Physical Anesthesia Plan  ASA: II  Anesthesia Plan: General   Post-op Pain Management:  Regional for Post-op pain   Induction: Intravenous  Airway Management Planned: Oral ETT  Additional Equipment:   Intra-op Plan:   Post-operative Plan: Extubation in OR  Informed Consent: I have reviewed the patients History and Physical, chart, labs and discussed the procedure including the risks, benefits and alternatives for the proposed anesthesia with the patient or authorized representative who has indicated his/her understanding and acceptance.   Dental advisory given  Plan Discussed with: CRNA, Anesthesiologist and Surgeon  Anesthesia Plan Comments:         Anesthesia Quick Evaluation

## 2016-05-21 NOTE — Anesthesia Procedure Notes (Addendum)
Anesthesia Regional Block: Interscalene brachial plexus block   Pre-Anesthetic Checklist: ,, timeout performed, Correct Patient, Correct Site, Correct Laterality, Correct Procedure, Correct Position, site marked, Risks and benefits discussed,  Surgical consent,  Pre-op evaluation,  At surgeon's request and post-op pain management  Laterality: Left  Prep: chloraprep       Needles:  Injection technique: Single-shot  Needle Type: Echogenic Stimulator Needle     Needle Length: 9cm  Needle Gauge: 21     Additional Needles:   Procedures: ultrasound guided,,,,,,,,  Narrative:  Start time: 05/21/2016 7:41 AM End time: 05/21/2016 7:47 AM Injection made incrementally with aspirations every 5 mL.  Performed by: Personally  Anesthesiologist: Mal AmabileFOSTER, Mikayah Joy  Additional Notes: Timeout performed. Patient sedated. Relevant anatomy ID'd using US. Incremental 2-65ml injection with frequent aspiration. Patient tolerated procedure well.

## 2016-05-21 NOTE — Progress Notes (Signed)
Assisted Dr. Foster with left, ultrasound guided, supraclavicular block. Side rails up, monitors on throughout procedure. See vital signs in flow sheet. Tolerated Procedure well. ?

## 2016-05-21 NOTE — Op Note (Signed)
NAME:  [name], [name]                    ACCOUNT NO.:  MEDICAL RECORD NO.:  [id number]  LOCATION:                                 FACILITY:  PHYSICIAN:  Zair Borawski A. Thurston Hole, M.D.      DATE OF BIRTH:  DATE OF PROCEDURE:  05/21/2016 DATE OF DISCHARGE:                              OPERATIVE REPORT   PREOPERATIVE DIAGNOSES: 1. Left shoulder acute traumatic rotator cuff tear. 2. Left shoulder acute traumatic labrum tear. 3. Left shoulder chronic nontraumatic impingement.  POSTOPERATIVE DIAGNOSES: 1. Left shoulder acute traumatic rotator cuff tear. 2. Left shoulder acute traumatic labrum tear. 3. Left shoulder chronic nontraumatic impingement.  PROCEDURE: 1. Left shoulder EUA, followed by arthroscopically assisted rotator     cuff repair using Arthrex speed bridge anchors with force SwiveLock     anchors. 2. Left shoulder partial labrum tear debridement. 3. Left shoulder subacromial decompression.  SURGEON:  Elana Alm. Thurston Hole, M.D.  ASSISTANT:  Julien Girt, PA.  ANESTHESIA:  General.  OPERATIVE TIME:  One hour 10 minutes.  COMPLICATIONS:  None.  INDICATION FOR PROCEDURE:  [name] [name] is a 41 year old gentleman, who injured his left shoulder in a fall 1 month ago.  Exam and MRIs revealed a large rotator cuff tear, partial labrum tear with impingement.  He has been in severe pain with significant disability and is now to undergo arthroscopy and repair.  DESCRIPTION OF PROCEDURE:  [name] [name] was brought to the operating room on May 21, 2016, after an interscalene block was placed in the holding room by Anesthesia.  He was placed on the operative table in supine position.  He received antibiotics preoperatively for prophylaxis. After being placed under general anesthesia, his left shoulder was examined.  He had full range of motion and his shoulder was stable on ligamentous exam.  He was then placed in a beach chair position and his shoulder and arm were prepped using sterile  DuraPrep and draped using sterile technique.  Time-out procedure was called and the correct left shoulder identified.  Initially, through a posterior arthroscopic portal, the arthroscope with a pump attached was placed into an anterior portal and arthroscopic probe was placed.  On initial inspection, the articular cartilage in the glenohumeral joint was intact.  He had partial tearing of the anterior, superior, and posterior labrum 25%, which was debrided.  The anterior-inferior labrum and anterior-inferior glenohumeral ligament complex was intact.  The biceps tendon anchor and biceps tendon was intact.  Rotator cuff showed a complete tear with retraction of the supraspinatus and infraspinatus.  Subscap showed partial tearing 25-30%, which was debrided.  Teres minor was intact. Inferior capsular recess free of pathology.  Subacromial space was entered and a lateral arthroscopic portal was made.  Large amount of bursitis was resected.  Impingement was noted as the undersurface of the acromion was digging into the rotator cuff and a subacromial decompression was carried out removing 6 mm of the undersurface of the anterior, anterolateral, anteromedial acromion and CA ligament release carried out as well.  The [facility] joint was not pathologic and thus was not resected.  At this point, through 2 accessory lateral portals, the rotator cuff  tear was repaired primarily with 2 medial row anchors using 2 SwiveLock using mattress suture technique, placed through the rotator cuff tear, both anterior and posterior and then 2 lateral row SwiveLock anchors, crisscrossing the medial row sutures and securing the rotator cuff tear back down into its anatomic position.  After this was done, shoulder girdle was brought to a satisfactory position and range of motion with no tension on the repair.  At this point, it was felt that all pathology has been satisfactorily addressed.  The instruments were removed.   Portals were closed with 3-0 nylon suture, sterile dressings, and a sling applied.  The patient was awakened and taken to recovery room in stable condition.  FOLLOWUP CARE:  [name] [name] will be followed as an outpatient on Dilaudid and Flexeril in an abduction sling with early physical therapy.  He will be seen back in the office in a week for sutures out and followup.     Ashanty Coltrane A. Thurston HoleWainer, M.D.     RAW/MEDQ  D:  05/21/2016  T:  05/21/2016  Job:  [id number]

## 2016-05-21 NOTE — Anesthesia Procedure Notes (Signed)
Procedure Name: Intubation Date/Time: 05/21/2016 9:08 AM Performed by: Amery Vandenbos D Pre-anesthesia Checklist: Patient identified, Emergency Drugs available, Suction available and Patient being monitored Patient Re-evaluated:Patient Re-evaluated prior to inductionOxygen Delivery Method: Circle system utilized Preoxygenation: Pre-oxygenation with 100% oxygen Intubation Type: IV induction Ventilation: Two handed mask ventilation required Laryngoscope Size: Mac and 4 Grade View: Grade II Tube type: Oral Tube size: 7.0 mm Number of attempts: 1 Airway Equipment and Method: Stylet and Oral airway Placement Confirmation: ETT inserted through vocal cords under direct vision,  positive ETCO2 and breath sounds checked- equal and bilateral Secured at: 22 cm Tube secured with: Tape Dental Injury: Teeth and Oropharynx as per pre-operative assessment  Comments: Full beard

## 2016-05-24 ENCOUNTER — Encounter (HOSPITAL_BASED_OUTPATIENT_CLINIC_OR_DEPARTMENT_OTHER): Payer: Self-pay | Admitting: Orthopedic Surgery

## 2016-05-24 DIAGNOSIS — R531 Weakness: Secondary | ICD-10-CM | POA: Diagnosis not present

## 2016-05-24 DIAGNOSIS — M25612 Stiffness of left shoulder, not elsewhere classified: Secondary | ICD-10-CM | POA: Diagnosis not present

## 2016-05-24 DIAGNOSIS — M25512 Pain in left shoulder: Secondary | ICD-10-CM | POA: Diagnosis not present

## 2016-05-27 DIAGNOSIS — M25612 Stiffness of left shoulder, not elsewhere classified: Secondary | ICD-10-CM | POA: Diagnosis not present

## 2016-05-27 DIAGNOSIS — M75122 Complete rotator cuff tear or rupture of left shoulder, not specified as traumatic: Secondary | ICD-10-CM | POA: Diagnosis not present

## 2016-05-27 DIAGNOSIS — M25512 Pain in left shoulder: Secondary | ICD-10-CM | POA: Diagnosis not present

## 2016-06-01 DIAGNOSIS — M75122 Complete rotator cuff tear or rupture of left shoulder, not specified as traumatic: Secondary | ICD-10-CM | POA: Diagnosis not present

## 2016-06-01 DIAGNOSIS — M25612 Stiffness of left shoulder, not elsewhere classified: Secondary | ICD-10-CM | POA: Diagnosis not present

## 2016-06-01 DIAGNOSIS — M25512 Pain in left shoulder: Secondary | ICD-10-CM | POA: Diagnosis not present

## 2016-06-04 DIAGNOSIS — M25612 Stiffness of left shoulder, not elsewhere classified: Secondary | ICD-10-CM | POA: Diagnosis not present

## 2016-06-04 DIAGNOSIS — M75122 Complete rotator cuff tear or rupture of left shoulder, not specified as traumatic: Secondary | ICD-10-CM | POA: Diagnosis not present

## 2016-06-04 DIAGNOSIS — M25512 Pain in left shoulder: Secondary | ICD-10-CM | POA: Diagnosis not present

## 2016-06-08 DIAGNOSIS — M75122 Complete rotator cuff tear or rupture of left shoulder, not specified as traumatic: Secondary | ICD-10-CM | POA: Diagnosis not present

## 2016-06-08 DIAGNOSIS — M25512 Pain in left shoulder: Secondary | ICD-10-CM | POA: Diagnosis not present

## 2016-06-08 DIAGNOSIS — M25612 Stiffness of left shoulder, not elsewhere classified: Secondary | ICD-10-CM | POA: Diagnosis not present

## 2016-06-11 DIAGNOSIS — M75122 Complete rotator cuff tear or rupture of left shoulder, not specified as traumatic: Secondary | ICD-10-CM | POA: Diagnosis not present

## 2016-06-11 DIAGNOSIS — M25612 Stiffness of left shoulder, not elsewhere classified: Secondary | ICD-10-CM | POA: Diagnosis not present

## 2016-06-11 DIAGNOSIS — M25512 Pain in left shoulder: Secondary | ICD-10-CM | POA: Diagnosis not present

## 2016-06-17 DIAGNOSIS — M75122 Complete rotator cuff tear or rupture of left shoulder, not specified as traumatic: Secondary | ICD-10-CM | POA: Diagnosis not present

## 2016-06-17 DIAGNOSIS — M25512 Pain in left shoulder: Secondary | ICD-10-CM | POA: Diagnosis not present

## 2016-06-17 DIAGNOSIS — M25612 Stiffness of left shoulder, not elsewhere classified: Secondary | ICD-10-CM | POA: Diagnosis not present

## 2016-06-22 DIAGNOSIS — M25512 Pain in left shoulder: Secondary | ICD-10-CM | POA: Diagnosis not present

## 2016-06-22 DIAGNOSIS — M25612 Stiffness of left shoulder, not elsewhere classified: Secondary | ICD-10-CM | POA: Diagnosis not present

## 2016-06-22 DIAGNOSIS — M75122 Complete rotator cuff tear or rupture of left shoulder, not specified as traumatic: Secondary | ICD-10-CM | POA: Diagnosis not present

## 2016-06-25 DIAGNOSIS — M75122 Complete rotator cuff tear or rupture of left shoulder, not specified as traumatic: Secondary | ICD-10-CM | POA: Diagnosis not present

## 2016-06-25 DIAGNOSIS — M25612 Stiffness of left shoulder, not elsewhere classified: Secondary | ICD-10-CM | POA: Diagnosis not present

## 2016-06-25 DIAGNOSIS — M25512 Pain in left shoulder: Secondary | ICD-10-CM | POA: Diagnosis not present

## 2016-07-02 DIAGNOSIS — M25512 Pain in left shoulder: Secondary | ICD-10-CM | POA: Diagnosis not present

## 2016-07-02 DIAGNOSIS — M75122 Complete rotator cuff tear or rupture of left shoulder, not specified as traumatic: Secondary | ICD-10-CM | POA: Diagnosis not present

## 2016-07-02 DIAGNOSIS — M25612 Stiffness of left shoulder, not elsewhere classified: Secondary | ICD-10-CM | POA: Diagnosis not present

## 2016-07-09 DIAGNOSIS — M25512 Pain in left shoulder: Secondary | ICD-10-CM | POA: Diagnosis not present

## 2016-07-09 DIAGNOSIS — M25612 Stiffness of left shoulder, not elsewhere classified: Secondary | ICD-10-CM | POA: Diagnosis not present

## 2016-07-09 DIAGNOSIS — M75122 Complete rotator cuff tear or rupture of left shoulder, not specified as traumatic: Secondary | ICD-10-CM | POA: Diagnosis not present

## 2016-07-16 DIAGNOSIS — M25512 Pain in left shoulder: Secondary | ICD-10-CM | POA: Diagnosis not present

## 2016-07-16 DIAGNOSIS — M75122 Complete rotator cuff tear or rupture of left shoulder, not specified as traumatic: Secondary | ICD-10-CM | POA: Diagnosis not present

## 2016-07-16 DIAGNOSIS — M25612 Stiffness of left shoulder, not elsewhere classified: Secondary | ICD-10-CM | POA: Diagnosis not present

## 2016-07-23 DIAGNOSIS — M25612 Stiffness of left shoulder, not elsewhere classified: Secondary | ICD-10-CM | POA: Diagnosis not present

## 2016-07-23 DIAGNOSIS — M75122 Complete rotator cuff tear or rupture of left shoulder, not specified as traumatic: Secondary | ICD-10-CM | POA: Diagnosis not present

## 2016-07-23 DIAGNOSIS — M25512 Pain in left shoulder: Secondary | ICD-10-CM | POA: Diagnosis not present

## 2016-07-29 DIAGNOSIS — M25612 Stiffness of left shoulder, not elsewhere classified: Secondary | ICD-10-CM | POA: Diagnosis not present

## 2016-07-29 DIAGNOSIS — M25512 Pain in left shoulder: Secondary | ICD-10-CM | POA: Diagnosis not present

## 2016-07-29 DIAGNOSIS — M75122 Complete rotator cuff tear or rupture of left shoulder, not specified as traumatic: Secondary | ICD-10-CM | POA: Diagnosis not present

## 2016-08-06 DIAGNOSIS — M25512 Pain in left shoulder: Secondary | ICD-10-CM | POA: Diagnosis not present

## 2016-08-06 DIAGNOSIS — M75122 Complete rotator cuff tear or rupture of left shoulder, not specified as traumatic: Secondary | ICD-10-CM | POA: Diagnosis not present

## 2016-08-06 DIAGNOSIS — M25612 Stiffness of left shoulder, not elsewhere classified: Secondary | ICD-10-CM | POA: Diagnosis not present

## 2016-08-27 DIAGNOSIS — M47816 Spondylosis without myelopathy or radiculopathy, lumbar region: Secondary | ICD-10-CM | POA: Diagnosis not present

## 2016-09-07 DIAGNOSIS — M25512 Pain in left shoulder: Secondary | ICD-10-CM | POA: Diagnosis not present

## 2016-12-08 DIAGNOSIS — Z23 Encounter for immunization: Secondary | ICD-10-CM | POA: Diagnosis not present

## 2017-02-14 ENCOUNTER — Emergency Department (HOSPITAL_COMMUNITY)
Admission: EM | Admit: 2017-02-14 | Discharge: 2017-02-14 | Disposition: A | Discharge status: Home / Self Care | Payer: 59 | Attending: Emergency Medicine | Admitting: Emergency Medicine

## 2017-02-14 ENCOUNTER — Encounter (HOSPITAL_COMMUNITY): Payer: Self-pay

## 2017-02-14 ENCOUNTER — Emergency Department (HOSPITAL_COMMUNITY): Payer: 59

## 2017-02-14 DIAGNOSIS — R109 Unspecified abdominal pain: Secondary | ICD-10-CM | POA: Diagnosis not present

## 2017-02-14 DIAGNOSIS — F17210 Nicotine dependence, cigarettes, uncomplicated: Secondary | ICD-10-CM | POA: Diagnosis not present

## 2017-02-14 DIAGNOSIS — M549 Dorsalgia, unspecified: Secondary | ICD-10-CM | POA: Insufficient documentation

## 2017-02-14 LAB — CBC WITH DIFFERENTIAL/PLATELET
BASOS ABS: 0.1 10*3/uL (ref 0.0–0.1)
Basophils Relative: 1 %
EOS PCT: 6 %
Eosinophils Absolute: 0.6 10*3/uL (ref 0.0–0.7)
HCT: 45.8 % (ref 39.0–52.0)
Hemoglobin: 15.4 g/dL (ref 13.0–17.0)
LYMPHS PCT: 33 %
Lymphs Abs: 3 10*3/uL (ref 0.7–4.0)
MCH: 30.2 pg (ref 26.0–34.0)
MCHC: 33.6 g/dL (ref 30.0–36.0)
MCV: 89.8 fL (ref 78.0–100.0)
Monocytes Absolute: 0.7 10*3/uL (ref 0.1–1.0)
Monocytes Relative: 8 %
NEUTROS PCT: 52 %
Neutro Abs: 4.9 10*3/uL (ref 1.7–7.7)
PLATELETS: 312 10*3/uL (ref 150–400)
RBC: 5.1 MIL/uL (ref 4.22–5.81)
RDW: 12.6 % (ref 11.5–15.5)
WBC: 9.2 10*3/uL (ref 4.0–10.5)

## 2017-02-14 LAB — URINALYSIS, ROUTINE W REFLEX MICROSCOPIC
Bilirubin Urine: NEGATIVE
Glucose, UA: >=500 mg/dL — AB
HGB URINE DIPSTICK: NEGATIVE
Ketones, ur: NEGATIVE mg/dL
Leukocytes, UA: NEGATIVE
Nitrite: NEGATIVE
PROTEIN: NEGATIVE mg/dL
Specific Gravity, Urine: 1.033 — ABNORMAL HIGH (ref 1.005–1.030)
pH: 5 (ref 5.0–8.0)

## 2017-02-14 LAB — BASIC METABOLIC PANEL
ANION GAP: 11 (ref 5–15)
BUN: 16 mg/dL (ref 6–20)
CO2: 22 mmol/L (ref 22–32)
Calcium: 9 mg/dL (ref 8.9–10.3)
Chloride: 103 mmol/L (ref 101–111)
Creatinine, Ser: 1.12 mg/dL (ref 0.61–1.24)
Glucose, Bld: 286 mg/dL — ABNORMAL HIGH (ref 65–99)
POTASSIUM: 4.3 mmol/L (ref 3.5–5.1)
SODIUM: 136 mmol/L (ref 135–145)

## 2017-02-14 MED ORDER — HYDROCODONE-ACETAMINOPHEN 5-325 MG PO TABS: 2.0000 | ORAL_TABLET | ORAL | 0 refills | Status: DC | PRN

## 2017-02-14 MED ORDER — IBUPROFEN 800 MG PO TABS: 800.0000 mg | ORAL_TABLET | Freq: Three times a day (TID) | ORAL | 0 refills | Status: DC | PRN

## 2017-02-14 MED ORDER — MORPHINE SULFATE (PF) 4 MG/ML IV SOLN
4.0000 mg | Freq: Once | INTRAVENOUS | Status: AC
  Administered 2017-02-14: 4 mg via INTRAVENOUS
  Filled 2017-02-14: qty 1

## 2017-02-14 MED ORDER — ONDANSETRON HCL 4 MG/2ML IJ SOLN
4.0000 mg | Freq: Once | INTRAMUSCULAR | Status: AC
  Administered 2017-02-14: 4 mg via INTRAVENOUS
  Filled 2017-02-14: qty 2

## 2017-02-14 MED ORDER — SODIUM CHLORIDE 0.9 % IV BOLUS (SEPSIS)
1000.0000 mL | Freq: Once | INTRAVENOUS | Status: AC
Start: 2017-02-14 — End: 2017-02-14
  Administered 2017-02-14: 1000 mL via INTRAVENOUS

## 2017-02-14 MED ORDER — ONDANSETRON HCL 4 MG PO TABS: 4.0000 mg | ORAL_TABLET | Freq: Three times a day (TID) | ORAL | 0 refills | Status: DC | PRN

## 2017-02-14 NOTE — ED Provider Notes (Signed)
[facility] EMERGENCY DEPARTMENT Provider Note   CSN: [id number] Arrival date & time: 02/14/17  4098     History   Chief Complaint No chief complaint on file.   HPI [name] [name] is a 42 y.o. male.  42 year old male with prior history of GERD, mild obesity, and tendinitis presents with complaint of right flank pain.  Patient reports that yesterday his right posterior back began to ache.  He thought initially that this was a muscle strain.  Overnight and then into this morning the pain has increased and moved to his right flank.  He denies nausea or vomiting.  He denies other abdominal pain.  He denies dysuria or difficulty urinating.  He denies a prior history of renal colic.  He does not report a significant injury that could have caused muscular back pain.  He took 800 mg of ibuprofen this morning at 7 AM without improvement in his pain.   The history is provided by the patient and the spouse.  Flank Pain  This is a new problem. The current episode started 6 to 12 hours ago. The problem occurs constantly. The problem has not changed since onset.Pertinent negatives include no chest pain and no abdominal pain. Nothing aggravates the symptoms. Nothing relieves the symptoms. Treatments tried: ibuprofen. The treatment provided no relief.    Past Medical History:  Diagnosis Date  . Anxiety   . GERD (gastroesophageal reflux disease)   . Obesity (BMI 30-39.9)   . Plantar fasciitis of right foot   . Skin lesion   . Tendonitis   . Traumatic tear of left rotator cuff 05/20/2016    Patient Active Problem List   Diagnosis Date Noted  . Traumatic tear of left rotator cuff 05/20/2016  . Family history of hypertrophic cardiomyopathy 08-09-2014  . Family history of sudden cardiac death in son 08-09-14    Past Surgical History:  Procedure Laterality Date  . SHOULDER ARTHROSCOPY WITH ROTATOR CUFF REPAIR AND SUBACROMIAL DECOMPRESSION Left 05/21/2016   Procedure: SHOULDER  ARTHROSCOPY WITH ROTATOR CUFF REPAIR AND SUBACROMIAL DECOMPRESSION LABRAL DEBRIDEMENT;  Surgeon: Salvatore Marvel, MD;  Location: [facility];  Service: Orthopedics;  Laterality: Left;  Marland Kitchen VASECTOMY         Home Medications    Prior to Admission medications   Medication Sig Start Date End Date Taking? Authorizing Provider  cyclobenzaprine (FLEXERIL) 5 MG tablet Take 1 tablet (5 mg total) by mouth 3 (three) times daily as needed for muscle spasms. 05/21/16   Shepperson, Kirstin, PA-C  HYDROmorphone (DILAUDID) 2 MG tablet 1-2 tablets every 4-6 hrs as needed for pain 05/21/16   Shepperson, Kirstin, PA-C  sertraline (ZOLOFT) 100 MG tablet Take 200 mg by mouth daily.    [provider]    Family History Family History  Problem Relation Age of Onset  . Hypertrophic cardiomyopathy Son   . Diabetes type I Son     Social History Social History   Tobacco Use  . Smoking status: Current Some Day Smoker    Types: Cigarettes  . Smokeless tobacco: Current User    Types: Snuff  Substance Use Topics  . Alcohol use: Yes    Alcohol/week: 0.0 oz    Comment: Occas  . Drug use: No     Allergies   Patient has no known allergies.   Review of Systems Review of Systems  Cardiovascular: Negative for chest pain.  Gastrointestinal: Negative for abdominal pain.  Genitourinary: Positive for flank pain.  All  other systems reviewed and are negative.    Physical Exam Updated Vital Signs BP 130/84   Pulse (!) 55   Temp 98.3 F (36.8 C) (Oral)   Resp 18   SpO2 94%   Physical Exam  Constitutional: He is oriented to person, place, and time. He appears well-developed and well-nourished. No distress.  HENT:  Head: Normocephalic and atraumatic.  Mouth/Throat: Oropharynx is clear and moist.  Eyes: Conjunctivae and EOM are normal. Pupils are equal, round, and reactive to light.  Neck: Normal range of motion. Neck supple.  Cardiovascular: Normal rate, regular rhythm and normal  heart sounds.  Pulmonary/Chest: Effort normal and breath sounds normal. No respiratory distress.  Abdominal: Soft. He exhibits no distension. There is no tenderness.  No abdominal tenderness   No CVAT tenderness   Genitourinary:  Genitourinary Comments: No CVAT   Musculoskeletal: Normal range of motion. He exhibits no edema or deformity.  Neurological: He is alert and oriented to person, place, and time.  Skin: Skin is warm and dry.  Psychiatric: He has a normal mood and affect.  Nursing note and vitals reviewed.    ED Treatments / Results  Labs (all labs ordered are listed, but only abnormal results are displayed) Labs Reviewed  URINALYSIS, ROUTINE W REFLEX MICROSCOPIC - Abnormal; Notable for the following components:      Result Value   APPearance HAZY (*)    Specific Gravity, Urine 1.033 (*)    Glucose, UA >=500 (*)    Bacteria, UA RARE (*)    Squamous Epithelial / LPF 0-5 (*)    All other components within normal limits  BASIC METABOLIC PANEL - Abnormal; Notable for the following components:   Glucose, Bld 286 (*)    All other components within normal limits  CBC WITH DIFFERENTIAL/PLATELET    EKG  EKG Interpretation None       Radiology Ct Abdomen Pelvis Wo Contrast  Result Date: 02/14/2017 CLINICAL DATA:  Left flank pain for 3 days. EXAM: CT ABDOMEN AND PELVIS WITHOUT CONTRAST TECHNIQUE: Multidetector CT imaging of the abdomen and pelvis was performed following the standard protocol without IV contrast. COMPARISON:  None. FINDINGS: Lower chest: Minimal scarring and/or atelectasis in the lung bases. Three small nodules along the right major fissure measure 3-4 mm each (series 4, images 3 and 5). No pleural effusion. Normal heart size. Hepatobiliary: Hepatic steatosis with fatty sparing along the gallbladder fossa. Unremarkable gallbladder. No biliary dilatation. Pancreas: Unremarkable. Spleen: Unremarkable. Adrenals/Urinary Tract: Unremarkable adrenal glands. No  evidence of renal calculi or hydronephrosis. 4.9 cm hypodense lesion in the upper pole of the left kidney compatible with a cyst. No ureteral calculi or ureteral dilatation. Unremarkable bladder. Stomach/Bowel: The stomach is within normal limits. There is no evidence of bowel obstruction or inflammation. The appendix is unremarkable. Vascular/Lymphatic: Normal caliber of the abdominal aorta. No enlarged or suspicious lymph nodes. Reproductive: Unremarkable prostate. Other: No intraperitoneal free fluid. Small fat containing umbilical hernia. Musculoskeletal: No acute osseous abnormality or suspicious osseous lesion. IMPRESSION: 1. No urinary tract calculi, hydronephrosis, or other acute abnormality identified in the abdomen or pelvis. 2. Hepatic steatosis. 3. **An incidental finding of potential clinical significance has been found. Small right lung base nodules measuring 3-4 mm in size. No follow-up needed if patient is low-risk (and has no known or suspected primary neoplasm). Non-contrast chest CT can be considered in 12 months if patient is high-risk. This recommendation follows the consensus statement: Guidelines for Management of Incidental Pulmonary Nodules Detected on  CT Images: From the Fleischner Society 2017; Radiology 2017; [phone].** Electronically Signed   By: Sebastian Ache M.D.   On: 02/14/2017 10:53    Procedures Procedures (including critical care time)  Medications Ordered in ED Medications  sodium chloride 0.9 % bolus 1,000 mL (not administered)  ondansetron (ZOFRAN) injection 4 mg (not administered)  morphine 4 MG/ML injection 4 mg (not administered)     Initial Impression / Assessment and Plan / ED Course  I have reviewed the triage vital signs and the nursing notes.  Pertinent labs & imaging results that were available during my care of the patient were reviewed by me and considered in my medical decision making (see chart for details).     MDM screen complete  Right  flank pain.  Presentation is consistent with renal colic.  However CT imaging does not reveal obvious renal stone.  I suspect that the patient may have had a very small stone pass prior to his arrival.  Patient is comfortable following ED treatment and evaluation.  Patient now desires discharge home.  Patient is aware of CT findings - including the presence of a small right lower lung nodule that requires close follow-up.  Strict return precautions are given and understood.    Final Clinical Impressions(s) / ED Diagnoses   Final diagnoses:  Flank pain    ED Discharge Orders        Ordered    ondansetron (ZOFRAN) 4 MG tablet  Every 8 hours PRN     02/14/17 1242    HYDROcodone-acetaminophen (NORCO/VICODIN) 5-325 MG tablet  Every 4 hours PRN     02/14/17 1242    ibuprofen (ADVIL,MOTRIN) 800 MG tablet  Every 8 hours PRN     02/14/17 1242       Wynetta Fines, MD 02/14/17 1245

## 2017-02-14 NOTE — ED Notes (Signed)
Patient transported to CT 

## 2017-02-14 NOTE — ED Triage Notes (Signed)
Patient complains of right flank pain x 1 day. Reports increased urination, no dysuria. Pain worse with ambulation and ROM

## 2017-02-15 DIAGNOSIS — R109 Unspecified abdominal pain: Secondary | ICD-10-CM | POA: Diagnosis not present

## 2017-02-15 DIAGNOSIS — R911 Solitary pulmonary nodule: Secondary | ICD-10-CM | POA: Diagnosis not present

## 2017-02-15 DIAGNOSIS — M5441 Lumbago with sciatica, right side: Secondary | ICD-10-CM | POA: Diagnosis not present

## 2017-02-15 DIAGNOSIS — R739 Hyperglycemia, unspecified: Secondary | ICD-10-CM | POA: Diagnosis not present

## 2017-03-01 DIAGNOSIS — M47816 Spondylosis without myelopathy or radiculopathy, lumbar region: Secondary | ICD-10-CM | POA: Diagnosis not present

## 2017-03-01 DIAGNOSIS — M5441 Lumbago with sciatica, right side: Secondary | ICD-10-CM | POA: Diagnosis not present

## 2017-03-01 DIAGNOSIS — R109 Unspecified abdominal pain: Secondary | ICD-10-CM | POA: Diagnosis not present

## 2017-03-02 ENCOUNTER — Other Ambulatory Visit: Payer: Self-pay | Admitting: Physician Assistant

## 2017-03-02 DIAGNOSIS — M549 Dorsalgia, unspecified: Secondary | ICD-10-CM

## 2017-03-25 ENCOUNTER — Other Ambulatory Visit: Payer: 59

## 2017-05-24 DIAGNOSIS — R911 Solitary pulmonary nodule: Secondary | ICD-10-CM | POA: Diagnosis not present

## 2017-05-24 DIAGNOSIS — R739 Hyperglycemia, unspecified: Secondary | ICD-10-CM | POA: Diagnosis not present

## 2017-05-24 DIAGNOSIS — E119 Type 2 diabetes mellitus without complications: Secondary | ICD-10-CM | POA: Diagnosis not present

## 2017-10-26 DIAGNOSIS — Z23 Encounter for immunization: Secondary | ICD-10-CM | POA: Diagnosis not present

## 2017-10-26 DIAGNOSIS — E7800 Pure hypercholesterolemia, unspecified: Secondary | ICD-10-CM | POA: Diagnosis not present

## 2017-10-26 DIAGNOSIS — R911 Solitary pulmonary nodule: Secondary | ICD-10-CM | POA: Diagnosis not present

## 2017-10-26 DIAGNOSIS — E119 Type 2 diabetes mellitus without complications: Secondary | ICD-10-CM | POA: Diagnosis not present

## 2017-10-31 ENCOUNTER — Other Ambulatory Visit: Payer: Self-pay | Admitting: Family Medicine

## 2017-10-31 DIAGNOSIS — R911 Solitary pulmonary nodule: Secondary | ICD-10-CM

## 2017-11-17 ENCOUNTER — Other Ambulatory Visit: Payer: 59

## 2017-11-23 ENCOUNTER — Ambulatory Visit
Admission: RE | Admit: 2017-11-23 | Discharge: 2017-11-23 | Disposition: A | Discharge status: Home / Self Care | Payer: 59 | Source: Ambulatory Visit | Attending: Family Medicine | Admitting: Family Medicine

## 2017-11-23 DIAGNOSIS — R918 Other nonspecific abnormal finding of lung field: Secondary | ICD-10-CM | POA: Diagnosis not present

## 2017-11-23 DIAGNOSIS — R911 Solitary pulmonary nodule: Secondary | ICD-10-CM

## 2018-01-27 DIAGNOSIS — E6609 Other obesity due to excess calories: Secondary | ICD-10-CM | POA: Diagnosis not present

## 2018-01-27 DIAGNOSIS — E119 Type 2 diabetes mellitus without complications: Secondary | ICD-10-CM | POA: Diagnosis not present

## 2018-01-27 DIAGNOSIS — Z23 Encounter for immunization: Secondary | ICD-10-CM | POA: Diagnosis not present

## 2018-01-27 DIAGNOSIS — E782 Mixed hyperlipidemia: Secondary | ICD-10-CM | POA: Diagnosis not present

## 2018-05-30 ENCOUNTER — Other Ambulatory Visit: Payer: Self-pay | Admitting: Family Medicine

## 2018-05-30 DIAGNOSIS — R918 Other nonspecific abnormal finding of lung field: Secondary | ICD-10-CM

## 2018-06-05 ENCOUNTER — Ambulatory Visit
Admission: RE | Admit: 2018-06-05 | Discharge: 2018-06-05 | Disposition: A | Discharge status: Home / Self Care | Payer: 59 | Source: Ambulatory Visit | Attending: Family Medicine | Admitting: Family Medicine

## 2018-06-05 DIAGNOSIS — R918 Other nonspecific abnormal finding of lung field: Secondary | ICD-10-CM

## 2018-10-11 ENCOUNTER — Encounter: Payer: Self-pay | Admitting: Neurology

## 2018-10-23 ENCOUNTER — Other Ambulatory Visit: Payer: Self-pay

## 2018-10-23 ENCOUNTER — Ambulatory Visit (INDEPENDENT_AMBULATORY_CARE_PROVIDER_SITE_OTHER): Payer: 59 | Admitting: Neurology

## 2018-10-23 ENCOUNTER — Encounter: Payer: Self-pay | Admitting: Neurology

## 2018-10-23 ENCOUNTER — Ambulatory Visit: Payer: 59 | Admitting: Neurology

## 2018-10-23 DIAGNOSIS — R202 Paresthesia of skin: Secondary | ICD-10-CM | POA: Insufficient documentation

## 2018-10-23 NOTE — Procedures (Signed)
     HISTORY:  [name] [name] is a 43 year old gentleman with a history of tingling and paresthesias in the lower left leg below the knee just above the ankle and including the heel.  He does have chronic low back pain without definite sciatica type pain down the left leg.  He is being evaluated for the above paresthesias.  NERVE CONDUCTION STUDIES:  Nerve conduction studies were performed on both lower extremities. The distal motor latencies and motor amplitudes for the peroneal and posterior tibial nerves were within normal limits. The nerve conduction velocities for these nerves were also normal, with exception of minimal slowing for the right posterior tibial nerve. The sensory latencies for the peroneal and sural nerves were within normal limits. The F wave latencies for the posterior tibial nerves were within normal limits on the left, slightly prolonged on the right.   EMG STUDIES:  EMG study was performed on the left lower extremity:  The tibialis anterior muscle reveals 2 to 4K motor units with full recruitment. No fibrillations or positive waves were seen. The peroneus tertius muscle reveals 2 to 4K motor units with full recruitment. No fibrillations or positive waves were seen. The medial gastrocnemius muscle reveals 1 to 3K motor units with full recruitment. No fibrillations or positive waves were seen. The vastus lateralis muscle reveals 2 to 4K motor units with full recruitment. No fibrillations or positive waves were seen. The iliopsoas muscle reveals 2 to 4K motor units with full recruitment. No fibrillations or positive waves were seen. The biceps femoris muscle (long head) reveals 2 to 4K motor units with full recruitment. No fibrillations or positive waves were seen. The lumbosacral paraspinal muscles were tested at 3 levels, and revealed no abnormalities of insertional activity at all 3 levels tested. There was good relaxation.   IMPRESSION:  Nerve conduction studies  done on both lower extremities were relatively unremarkable with exception of some slight slowing for the right posterior tibial nerve.  EMG evaluation of the left lower extremity was unremarkable, no clear evidence of an overlying lumbosacral radiculopathy was seen.  Jill Alexanders MD 10/23/2018 1:45 PM  [facility] [address] [place], [place] [address]  Phone [phone] Fax [phone]

## 2018-10-23 NOTE — Progress Notes (Signed)
Please refer to EMG and nerve conduction procedure note.  

## 2018-10-24 NOTE — Progress Notes (Signed)
[place]    Nerve / Sites Muscle Latency Ref. Amplitude Ref. Rel Amp Segments Distance Velocity Ref. Area    ms ms mV mV %  cm m/s m/s mVms  L Peroneal - EDB     Ankle EDB 4.2 ?6.5 5.4 ?2.0 100 Ankle - EDB 9   16.4     Fib head EDB 10.8  4.3  79.7 Fib head - Ankle 33 50 ?44 15.3     Pop fossa EDB 12.9  4.0  92.2 Pop fossa - Fib head 10 48 ?44 14.6         Pop fossa - Ankle      R Peroneal - EDB     Ankle EDB 4.0 ?6.5 7.3 ?2.0 100 Ankle - EDB 9   20.2     Fib head EDB 10.8  6.3  86.7 Fib head - Ankle 33 49 ?44 19.3     Pop fossa EDB 12.9  6.4  102 Pop fossa - Fib head 10 47 ?44 19.6         Pop fossa - Ankle      L Tibial - AH     Ankle AH 3.2 ?5.8 4.2 ?4.0 100 Ankle - AH 9   10.8     Pop fossa AH 12.8  3.4  82.1 Pop fossa - Ankle 42 44 ?41 8.7  R Tibial - AH     Ankle AH 3.4 ?5.8 4.1 ?4.0 100 Ankle - AH 9   9.1     Pop fossa AH 14.6  1.7  41.8 Pop fossa - Ankle 45 40 ?41 4.3             SNC    Nerve / Sites Rec. Site Peak Lat Ref.  Amp Ref. Segments Distance    ms ms V V  cm  L Sural - Ankle (Calf)     Calf Ankle 2.9 ?4.4 4 ?6 Calf - Ankle 14  R Sural - Ankle (Calf)     Calf Ankle 3.1 ?4.4 7 ?6 Calf - Ankle 14  L Superficial peroneal - Ankle     Lat leg Ankle 4.0 ?4.4 10 ?6 Lat leg - Ankle 14  R Superficial peroneal - Ankle     Lat leg Ankle 3.5 ?4.4 7 ?6 Lat leg - Ankle 14              F  Wave    Nerve F Lat Ref.   ms ms  L Tibial - AH 55.2 ?56.0  R Tibial - AH 57.0 ?56.0

## 2018-11-14 ENCOUNTER — Encounter: Payer: Self-pay | Admitting: Neurology

## 2019-02-11 IMAGING — DX DG SHOULDER 2+V*L*
3 series · 3 of 3 positions shown · non-contrast
Comparison: None.

CLINICAL DATA: Patient fell landing on left arm. Lateral aspect of
proximal humeral pain.

EXAM:
LEFT SHOULDER - 2+ VIEW

[shoulder grashey]
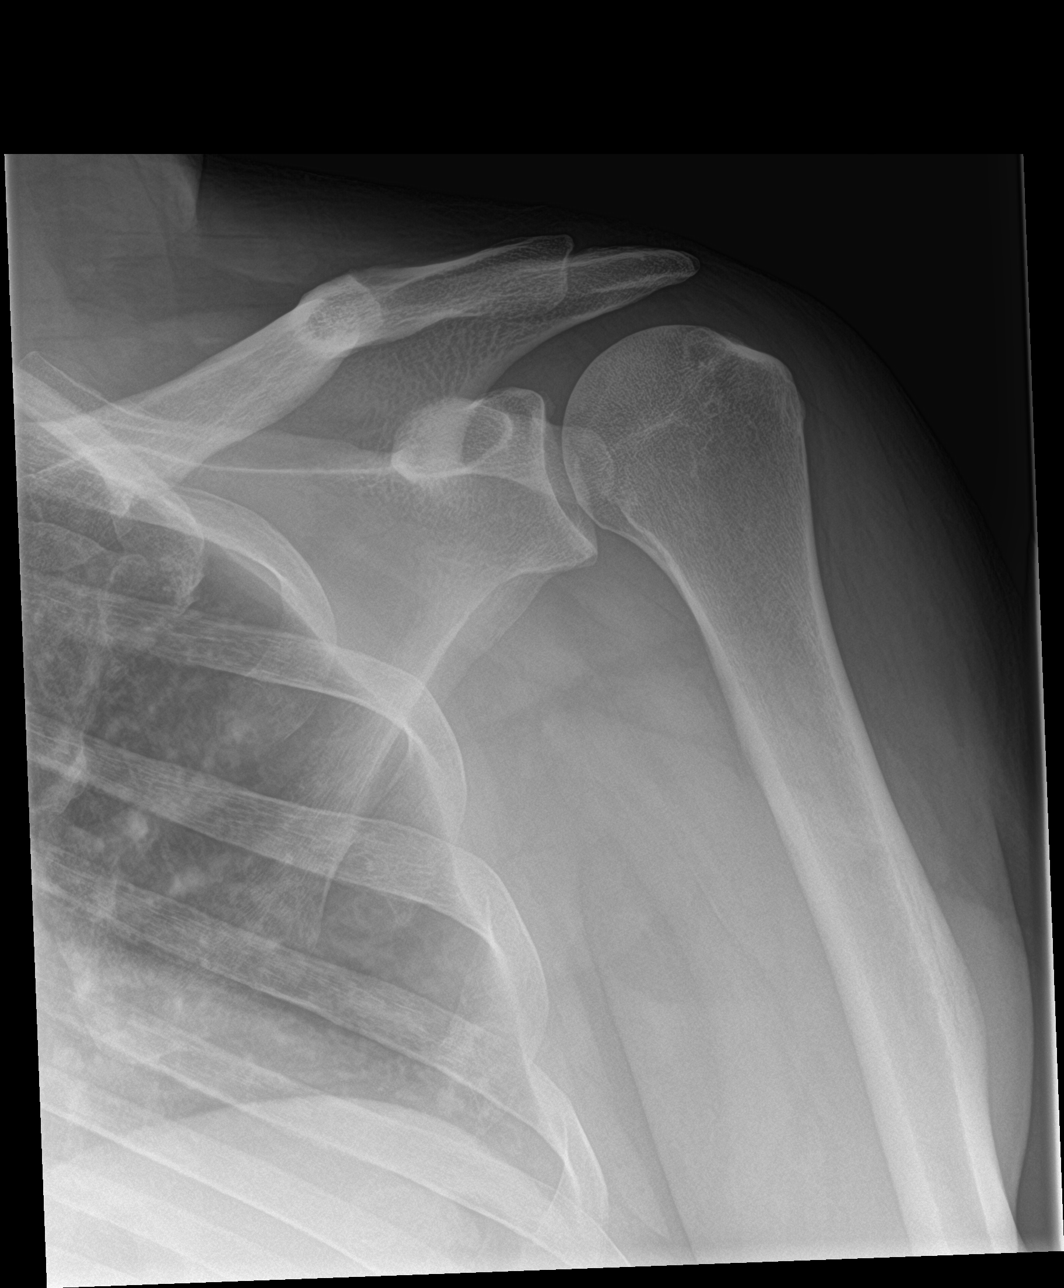

[shoulder y view]
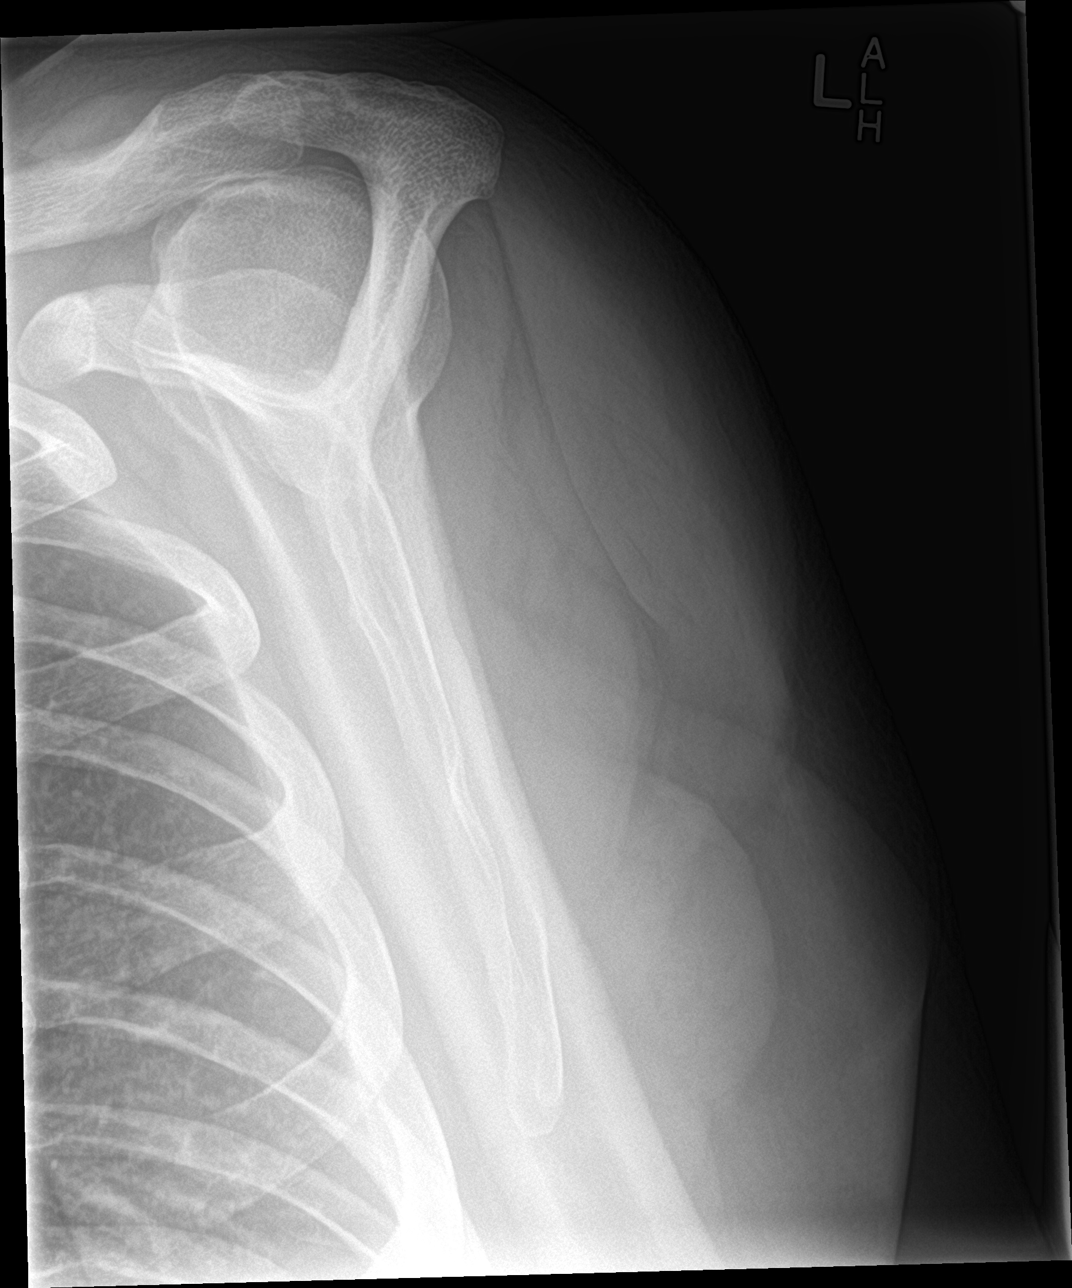

[shoulder axillary]
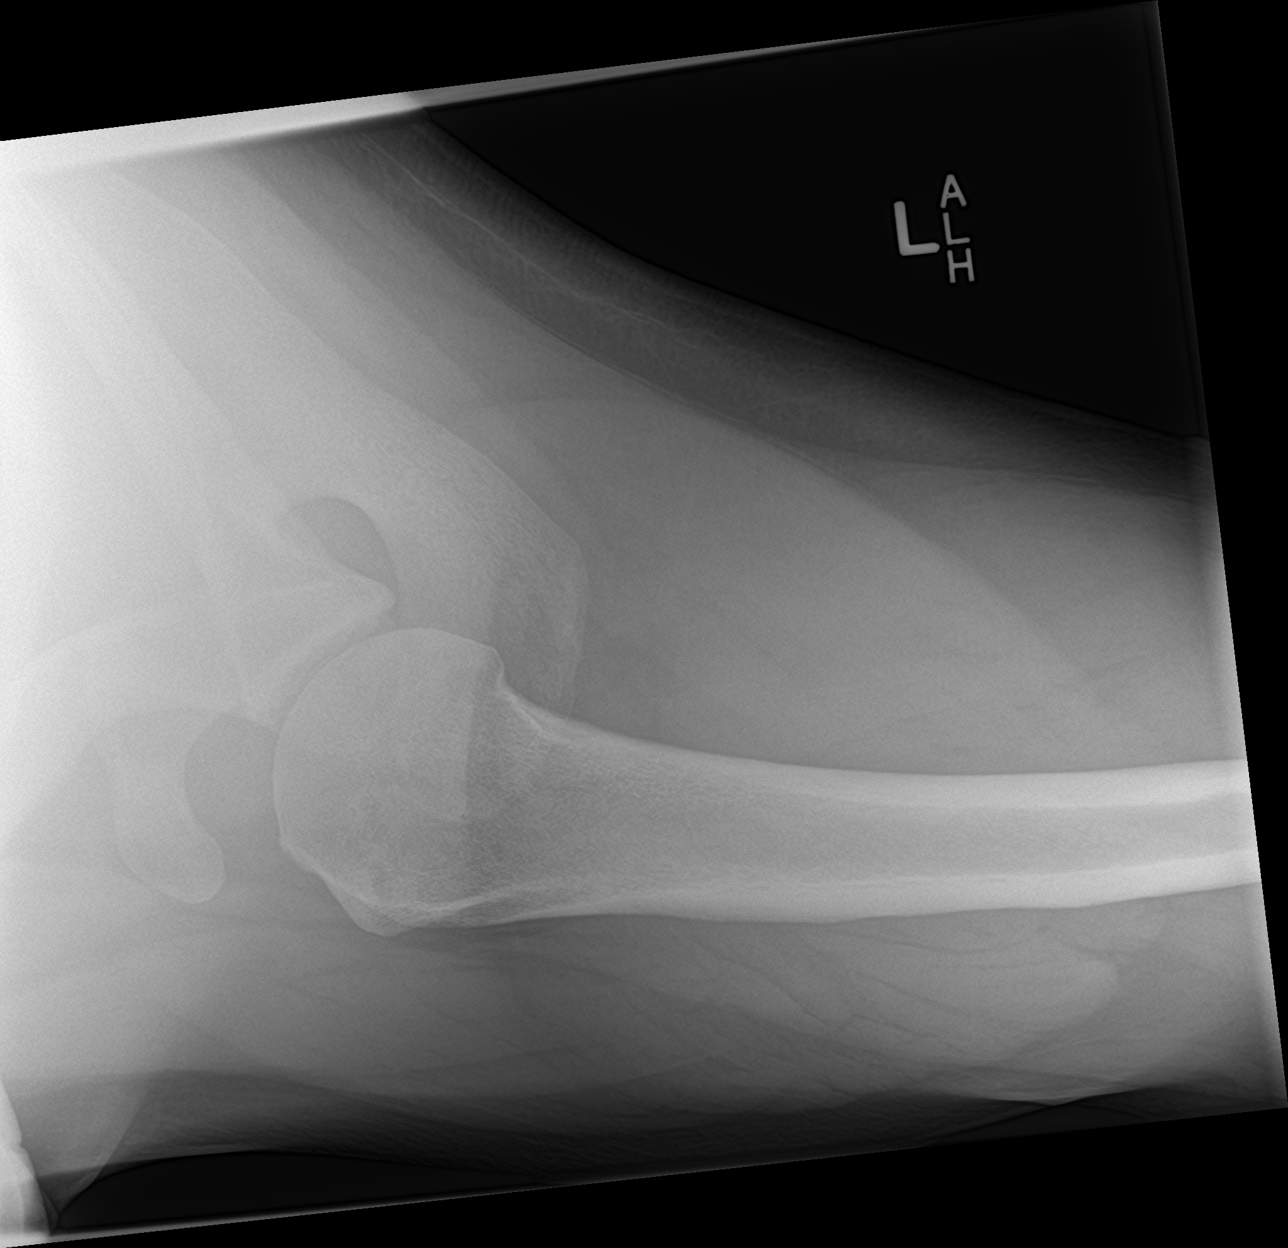

[3 of 3 positions shown; findings below may reference images not displayed]

FINDINGS: There is no evidence of fracture or dislocation. AC and glenohumeral
joints are maintained. The adjacent ribs and lung are nonacute in
appearance. There is no evidence of arthropathy or other focal bone
abnormality. Soft tissues are unremarkable.
IMPRESSION: No acute fracture or malalignment of the left shoulder.

## 2020-05-28 ENCOUNTER — Other Ambulatory Visit: Payer: Self-pay | Admitting: Family Medicine

## 2020-05-28 DIAGNOSIS — R911 Solitary pulmonary nodule: Secondary | ICD-10-CM

## 2020-12-31 ENCOUNTER — Other Ambulatory Visit: Payer: 59

## 2021-01-23 ENCOUNTER — Other Ambulatory Visit: Payer: 59

## 2021-03-27 IMAGING — CT CT CHEST WITHOUT CONTRAST
2 of 4 series · 15 of 36 positions shown, 18 images · non-contrast
Comparison: 11/23/2017

CLINICAL DATA: Follow-up nodules

EXAM:
CT CHEST WITHOUT CONTRAST
TECHNIQUE: Multidetector CT imaging of the chest was performed following the
standard protocol without IV contrast.

[Series 2: chest 2.00 br40 s3 ax · axial · 0.88mm/px · z∈[+1606,+1870]mm · 12 of 157 slices shown, 15 images]
[im 13/157  mediastinal]
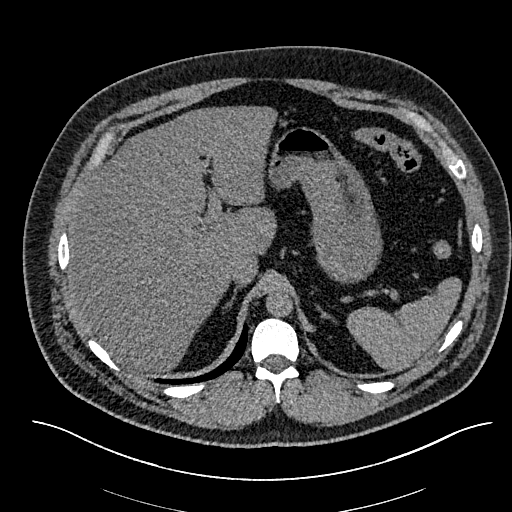
[im 13/157  lung]
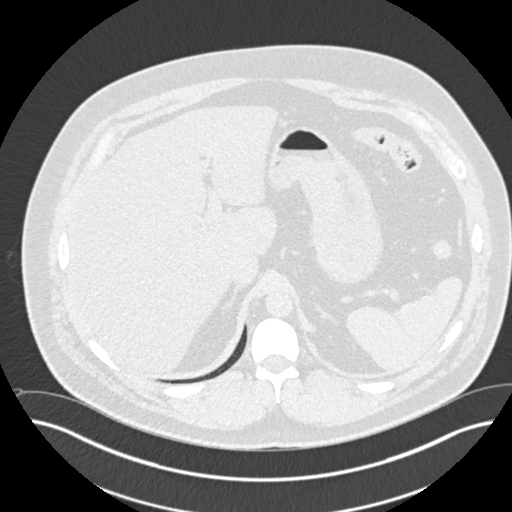
[im 25/157  lung]
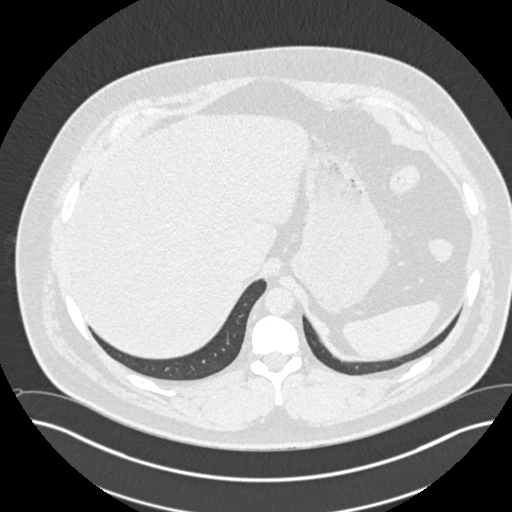
[im 37/157  lung]
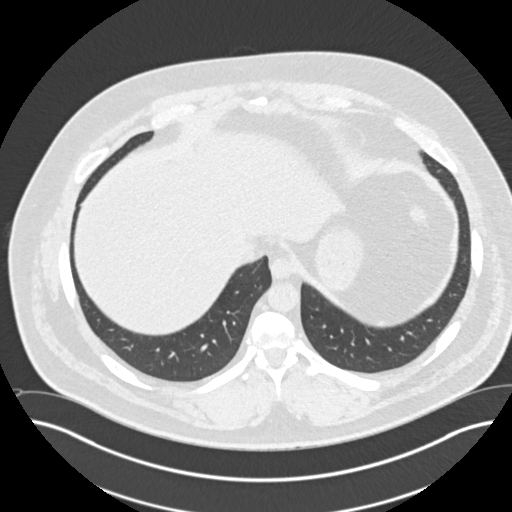
[im 49/157  lung]
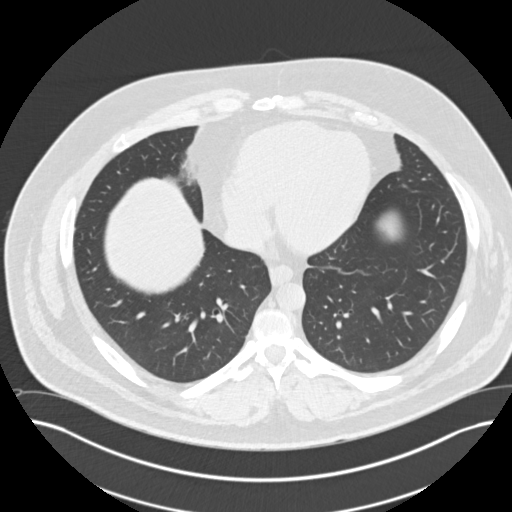
[im 61/157  mediastinal]
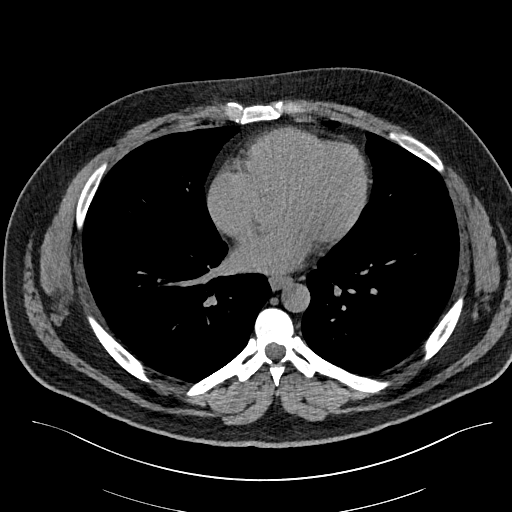
[im 61/157  lung]
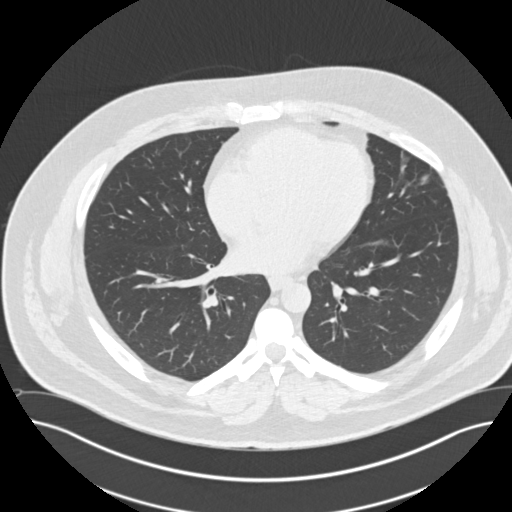
[im 73/157  lung]
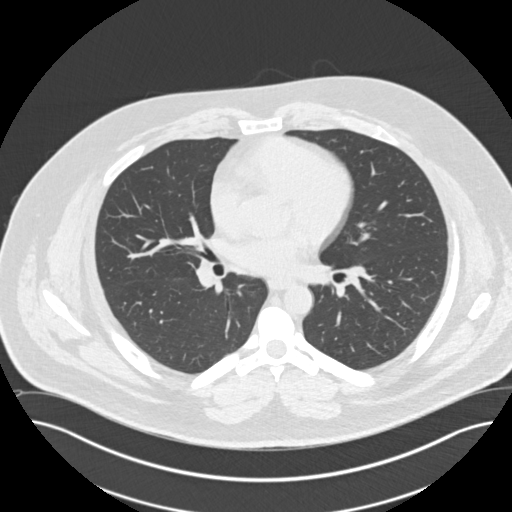
[im 85/157  lung]
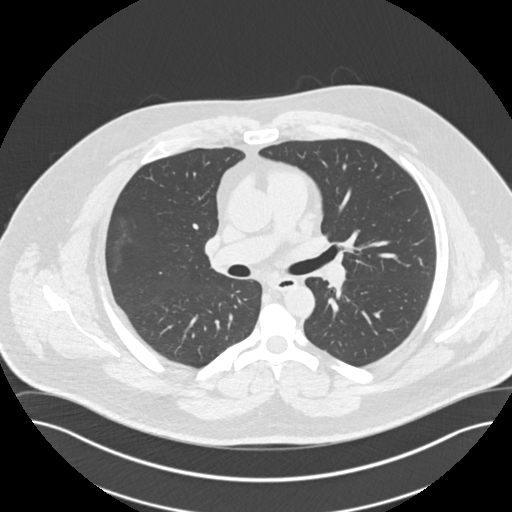
[im 97/157  lung]
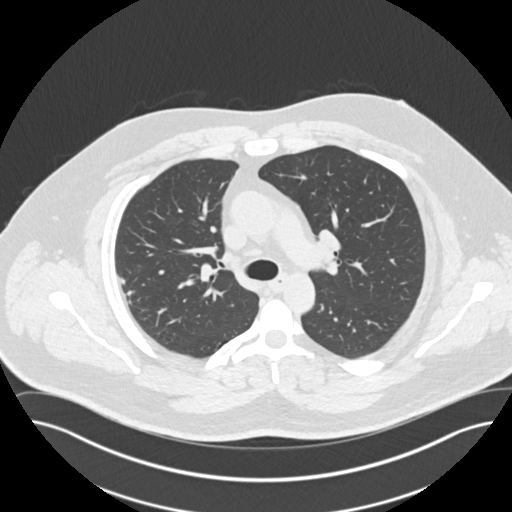
[im 109/157  mediastinal]
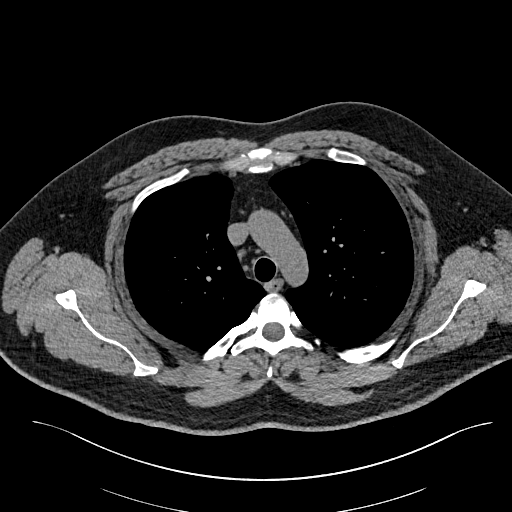
[im 109/157  lung]
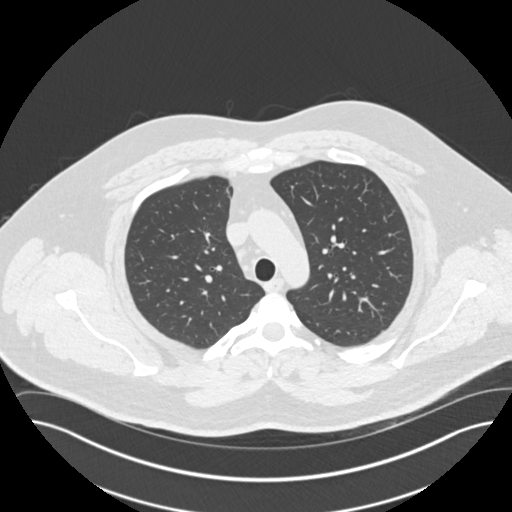
[im 121/157  lung]
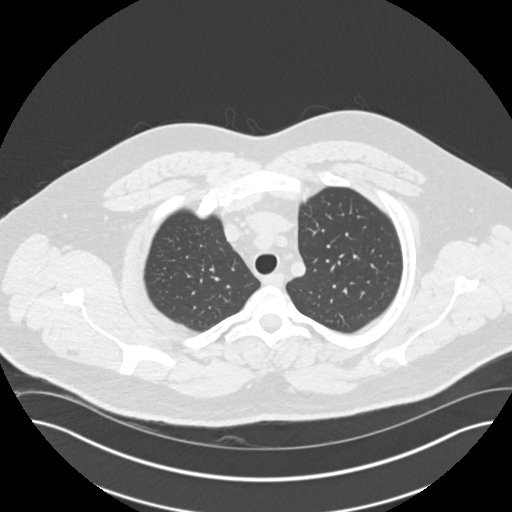
[im 133/157  lung]
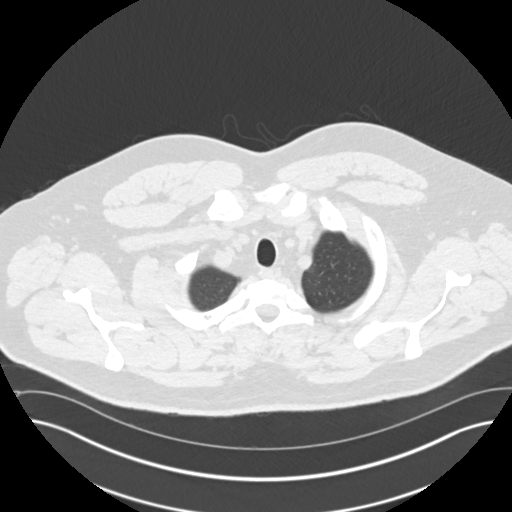
[im 145/157  lung]
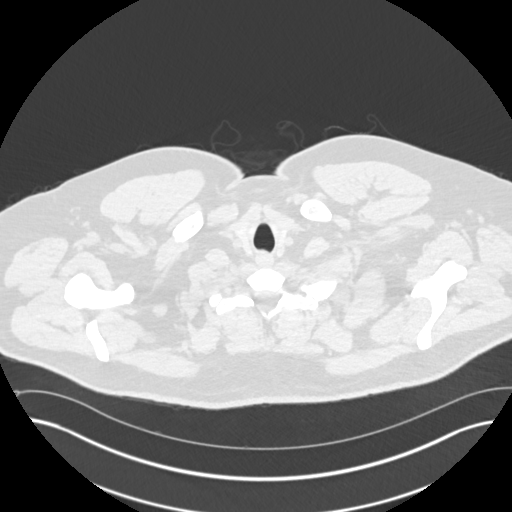

[Series 4: chest 2.00 br40 s3 cor · coronal · 0.62mm/px · 3 of 225 slices shown]
[im 45/225  lung]
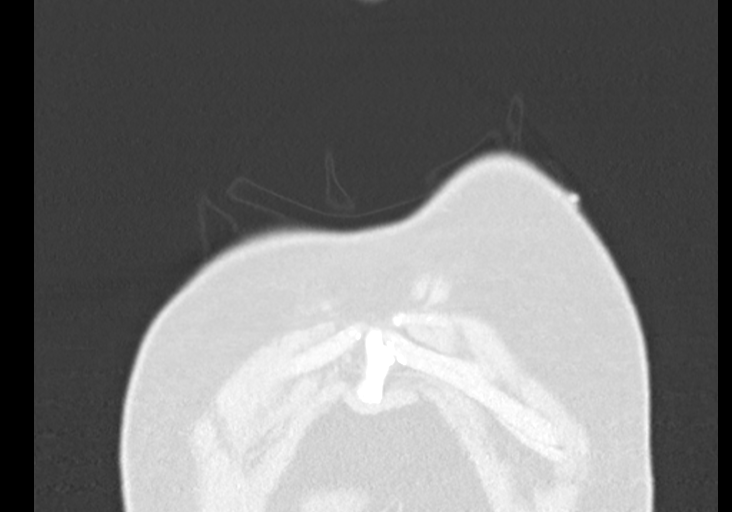
[im 90/225  lung]
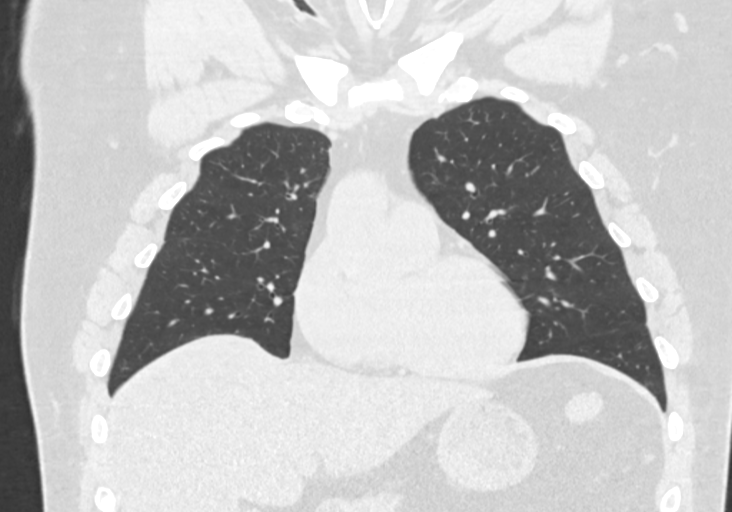
[im 135/225  lung]
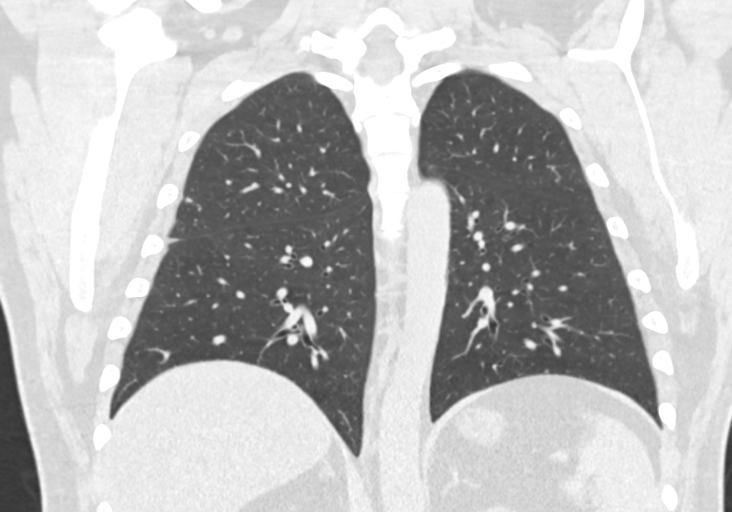

[15 of 36 positions shown; findings below may reference images not displayed]

FINDINGS: Cardiovascular: Heart is normal size. Aorta is normal caliber.

Mediastinum/Nodes: No mediastinal, hilar, or axillary adenopathy.

Lungs/Pleura: Clustered peripheral nodules in the right upper lobe
best seen on image 62 are stable. Small perifissural nodularity
along the right minor fissure stable. No new or enlarging pulmonary
nodules, confluent opacities or effusions.

Upper Abdomen: Fatty infiltration of the liver.  No acute findings.

Musculoskeletal: Chest wall soft tissues are unremarkable. No acute
bony abnormality.
IMPRESSION: Nodularity in the right upper lobe and along the minor fissure are
stable, likely postinflammatory. These could be followed with repeat
CT 18-24 months after original study (11/23/2017) to ensure
stability.

Fatty infiltration of the liver.

No acute cardiopulmonary disease.

## 2022-02-23 ENCOUNTER — Ambulatory Visit
Admission: EM | Admit: 2022-02-23 | Discharge: 2022-02-23 | Disposition: A | Discharge status: Home / Self Care | Payer: 59 | Attending: Emergency Medicine | Admitting: Emergency Medicine

## 2022-02-23 DIAGNOSIS — U071 COVID-19: Secondary | ICD-10-CM | POA: Diagnosis present

## 2022-02-23 DIAGNOSIS — B349 Viral infection, unspecified: Secondary | ICD-10-CM | POA: Diagnosis not present

## 2022-02-23 HISTORY — DX: Type 2 diabetes mellitus without complications: E11.9

## 2022-02-23 NOTE — ED Provider Notes (Signed)
[name] [name]    CSN: [id number] Arrival date & time: 02/23/22  0909      History   Chief Complaint Chief Complaint  Patient presents with   Covid Test    HPI [name] [name] is a 47 y.o. male.  Patient presents with 4 day history of congestion and cough.  He had a positive COVID test at home last night.  He requests a PCR COVID test here which he needs for his work.  He denies fever, chills, sore throat, shortness of breath, vomiting, diarrhea, or other symptoms.  No OTC medications taken today.  His medical history includes diabetes.    The history is provided by the patient and medical records.    Past Medical History:  Diagnosis Date   Anxiety    Diabetes mellitus without complication (HCC)    GERD (gastroesophageal reflux disease)    Obesity (BMI 30-39.9)    Plantar fasciitis of right foot    Skin lesion    Tendonitis    Traumatic tear of left rotator cuff 05/20/2016    Patient Active Problem List   Diagnosis Date Noted   Lab test positive for detection of COVID-19 virus 02/23/2022   Paresthesia 10/23/2018   Traumatic tear of left rotator cuff 05/20/2016   Family history of hypertrophic cardiomyopathy 08-11-14   Family history of sudden cardiac death in son 2014/08/11    Past Surgical History:  Procedure Laterality Date   SHOULDER ARTHROSCOPY WITH ROTATOR CUFF REPAIR AND SUBACROMIAL DECOMPRESSION Left 05/21/2016   Procedure: SHOULDER ARTHROSCOPY WITH ROTATOR CUFF REPAIR AND SUBACROMIAL DECOMPRESSION LABRAL DEBRIDEMENT;  Surgeon: Elsie Saas, MD;  Location: [place];  Service: Orthopedics;  Laterality: Left;   VASECTOMY         Home Medications    Prior to Admission medications   Medication Sig Start Date End Date Taking? Authorizing Provider  atorvastatin (LIPITOR) 40 MG tablet Take 40 mg by mouth daily. 02/15/22  Yes [provider]  JARDIANCE 25 MG TABS tablet Take 25 mg by mouth daily. 02/15/22  Yes [provider]  lisinopril (ZESTRIL) 10 MG tablet Take 10 mg by mouth daily. 02/12/22  Yes [provider]  metFORMIN (GLUCOPHAGE) 1000 MG tablet Take 1,000 mg by mouth 2 (two) times daily. 02/15/22  Yes [provider]  HYDROcodone-acetaminophen (NORCO/VICODIN) 5-325 MG tablet Take 2 tablets by mouth every 4 (four) hours as needed. 02/14/17   Valarie Merino, MD  ibuprofen (ADVIL,MOTRIN) 200 MG tablet Take 800 mg by mouth as needed for moderate pain.    [provider]  ibuprofen (ADVIL,MOTRIN) 800 MG tablet Take 1 tablet (800 mg total) by mouth every 8 (eight) hours as needed. 02/14/17   Valarie Merino, MD  ondansetron (ZOFRAN) 4 MG tablet Take 1 tablet (4 mg total) by mouth every 8 (eight) hours as needed for nausea or vomiting. 02/14/17   Valarie Merino, MD  sertraline (ZOLOFT) 100 MG tablet Take 200 mg by mouth daily.    [provider]    Family History Family History  Problem Relation Age of Onset   Hypertrophic cardiomyopathy Son    Diabetes type I Son     Social History Social History   Tobacco Use   Smoking status: Former    Types: Cigarettes   Smokeless tobacco: Former    Types: Snuff  Vaping Use   Vaping Use: Never used  Substance Use Topics   Alcohol use: Yes    Alcohol/week:  0.0 standard drinks of alcohol    Comment: Occas   Drug use: No     Allergies   Patient has no known allergies.   Review of Systems Review of Systems  Constitutional:  Negative for chills and fever.  HENT:  Positive for congestion. Negative for ear pain and sore throat.   Respiratory:  Positive for cough. Negative for shortness of breath.   Cardiovascular:  Negative for chest pain and palpitations.  Gastrointestinal:  Negative for diarrhea and vomiting.  Skin:  Negative for color change and rash.  All other systems reviewed and are negative.    Physical Exam Triage Vital Signs ED Triage Vitals [02/23/22 1032]  Enc Vitals Group     BP       Pulse Rate 67     Resp 18     Temp 97.6 F (36.4 C)     Temp src      SpO2 95 %     Weight      Height      Head Circumference      Peak Flow      Pain Score      Pain Loc      Pain Edu?      Excl. in [place]?    No data found.  Updated Vital Signs BP 122/86   Pulse 67   Temp 97.6 F (36.4 C)   Resp 18   SpO2 95%   Visual Acuity Right Eye Distance:   Left Eye Distance:   Bilateral Distance:    Right Eye Near:   Left Eye Near:    Bilateral Near:     Physical Exam Vitals and nursing note reviewed.  Constitutional:      General: He is not in acute distress.    Appearance: Normal appearance. He is well-developed. He is not ill-appearing.  HENT:     Right Ear: Tympanic membrane normal.     Left Ear: Tympanic membrane normal.     Nose: Nose normal.     Mouth/Throat:     Mouth: Mucous membranes are moist.     Pharynx: Oropharynx is clear.  Cardiovascular:     Rate and Rhythm: Normal rate and regular rhythm.     Heart sounds: Normal heart sounds.  Pulmonary:     Effort: Pulmonary effort is normal. No respiratory distress.     Breath sounds: Normal breath sounds.  Musculoskeletal:     Cervical back: Neck supple.  Skin:    General: Skin is warm and dry.     Capillary Refill: Capillary refill takes less than 2 seconds.  Neurological:     Mental Status: He is alert.  Psychiatric:        Mood and Affect: Mood normal.        Behavior: Behavior normal.      UC Treatments / Results  Labs (all labs ordered are listed, but only abnormal results are displayed) Labs Reviewed  SARS CORONAVIRUS 2 (TAT 6-24 HRS)    EKG   Radiology No results found.  Procedures Procedures (including critical care time)  Medications Ordered in UC Medications - No data to display  Initial Impression / Assessment and Plan / UC Course  I have reviewed the triage vital signs and the nursing notes.  Pertinent labs & imaging results that were available during my care of the patient  were reviewed by me and considered in my medical decision making (see chart for details).    COVID-19.  Patient  tested positive for COVID last night at home.  He needs a PCR test for work.  He is on day 5 of his symptoms and his medical history includes diabetes.  He declines COVID antiviral treatment.  His symptoms are mild and he states he will continue symptomatic treatment.  Education provided on COVID.  Instructed patient to follow up with his PCP if his symptoms are not improving.  He agrees to plan of care.    Final Clinical Impressions(s) / UC Diagnoses   Final diagnoses:  Lab test positive for detection of COVID-19 virus  COVID-19     Discharge Instructions      Your COVID test is pending.    Take Tylenol as needed for fever or discomfort.  Rest and keep yourself hydrated.    Follow-up with your primary care provider if your symptoms are not improving.         ED Prescriptions   None    PDMP not reviewed this encounter.   Sharion Balloon, NP 02/23/22 [phone]

## 2022-02-23 NOTE — Discharge Instructions (Addendum)
Your COVID test is pending.    Take Tylenol as needed for fever or discomfort.  Rest and keep yourself hydrated.    Follow-up with your primary care provider if your symptoms are not improving.     

## 2022-02-23 NOTE — ED Triage Notes (Signed)
Patient to Urgent Care with complaints of cough/ nasal congestion. Symptoms started four days ago.   Requests a Covid test for work. Reports having a positive home test last night.

## 2022-02-24 LAB — SARS CORONAVIRUS 2 (TAT 6-24 HRS): SARS Coronavirus 2: POSITIVE — AB

## 2022-09-02 ENCOUNTER — Emergency Department (HOSPITAL_COMMUNITY): Payer: 59

## 2022-09-02 ENCOUNTER — Other Ambulatory Visit: Payer: Self-pay

## 2022-09-02 ENCOUNTER — Emergency Department (HOSPITAL_COMMUNITY)
Admission: EM | Admit: 2022-09-02 | Discharge: 2022-09-03 | Disposition: A | Discharge status: Home / Self Care | Payer: 59 | Attending: Emergency Medicine | Admitting: Emergency Medicine

## 2022-09-02 ENCOUNTER — Encounter (HOSPITAL_COMMUNITY): Payer: Self-pay

## 2022-09-02 DIAGNOSIS — E119 Type 2 diabetes mellitus without complications: Secondary | ICD-10-CM | POA: Insufficient documentation

## 2022-09-02 DIAGNOSIS — M25511 Pain in right shoulder: Secondary | ICD-10-CM | POA: Insufficient documentation

## 2022-09-02 DIAGNOSIS — Z7984 Long term (current) use of oral hypoglycemic drugs: Secondary | ICD-10-CM | POA: Diagnosis not present

## 2022-09-02 DIAGNOSIS — S0990XA Unspecified injury of head, initial encounter: Secondary | ICD-10-CM | POA: Diagnosis not present

## 2022-09-02 DIAGNOSIS — W11XXXA Fall on and from ladder, initial encounter: Secondary | ICD-10-CM | POA: Diagnosis not present

## 2022-09-02 DIAGNOSIS — W19XXXA Unspecified fall, initial encounter: Secondary | ICD-10-CM

## 2022-09-02 LAB — BASIC METABOLIC PANEL
Anion gap: 12 (ref 5–15)
BUN: 13 mg/dL (ref 6–20)
CO2: 19 mmol/L — ABNORMAL LOW (ref 22–32)
Calcium: 8.9 mg/dL (ref 8.9–10.3)
Chloride: 106 mmol/L (ref 98–111)
Creatinine, Ser: 1.06 mg/dL (ref 0.61–1.24)
GFR, Estimated: >60 mL/min (ref >60)
Glucose, Bld: 99 mg/dL (ref 70–99)
Potassium: 3.7 mmol/L (ref 3.5–5.1)
Sodium: 137 mmol/L (ref 135–145)

## 2022-09-02 LAB — CBC WITH DIFFERENTIAL/PLATELET
Abs Immature Granulocytes: 0.05 10*3/uL (ref 0.00–0.07)
Basophils Absolute: 0.1 10*3/uL (ref 0.0–0.1)
Basophils Relative: 1 %
Eosinophils Absolute: 0.9 10*3/uL — ABNORMAL HIGH (ref 0.0–0.5)
Eosinophils Relative: 8 %
HCT: 42.8 % (ref 39.0–52.0)
Hemoglobin: 14.5 g/dL (ref 13.0–17.0)
Immature Granulocytes: 1 %
Lymphocytes Relative: 23 %
Lymphs Abs: 2.5 10*3/uL (ref 0.7–4.0)
MCH: 31.9 pg (ref 26.0–34.0)
MCHC: 33.9 g/dL (ref 30.0–36.0)
MCV: 94.1 fL (ref 80.0–100.0)
Monocytes Absolute: 0.8 10*3/uL (ref 0.1–1.0)
Monocytes Relative: 7 %
Neutro Abs: 6.6 10*3/uL (ref 1.7–7.7)
Neutrophils Relative %: 60 %
Platelets: 332 10*3/uL (ref 150–400)
RBC: 4.55 MIL/uL (ref 4.22–5.81)
RDW: 12.9 % (ref 11.5–15.5)
WBC: 10.9 10*3/uL — ABNORMAL HIGH (ref 4.0–10.5)
nRBC: 0 % (ref 0.0–0.2)

## 2022-09-02 MED ORDER — MELOXICAM 15 MG PO TABS: 15.0000 mg | ORAL_TABLET | Freq: Every day | ORAL | 0 refills | Status: AC

## 2022-09-02 MED ORDER — HYDROCODONE-ACETAMINOPHEN 5-325 MG PO TABS
1.0000 | ORAL_TABLET | Freq: Once | ORAL | Status: AC
  Administered 2022-09-02: 1 via ORAL
  Filled 2022-09-02: qty 1

## 2022-09-02 NOTE — ED Provider Notes (Signed)
[place] EMERGENCY DEPARTMENT AT [facility] Provider Note   CSN: [id number] Arrival date & time: 09/02/22  2145     History  Chief Complaint  Patient presents with   Marletta Lor    [name] [name] is a 47 y.o. male.  Patient presents to the emergency department via EMS complaining of right shoulder pain secondary to a fall.  Patient states he was standing on a 6 foot stepladder, with feet approximately 4 feet off the ground when he fell and landed directly on the right shoulder.  He denies hitting his head, denies neck pain, denies blood thinner usage.  EMS placed the patient in a c-collar as a precaution.  Past medical history significant for traumatic tear of left rotator cuff, tendinitis, type II DM  HPI     Home Medications Prior to Admission medications   Medication Sig Start Date End Date Taking? Authorizing Provider  meloxicam (MOBIC) 15 MG tablet Take 1 tablet (15 mg total) by mouth daily for 15 days. 09/02/22 09/17/22 Yes Darrick Grinder, PA-C  atorvastatin (LIPITOR) 40 MG tablet Take 40 mg by mouth daily. 02/15/22   [provider]  HYDROcodone-acetaminophen (NORCO/VICODIN) 5-325 MG tablet Take 2 tablets by mouth every 4 (four) hours as needed. 02/14/17   Wynetta Fines, MD  ibuprofen (ADVIL,MOTRIN) 200 MG tablet Take 800 mg by mouth as needed for moderate pain.    [provider]  ibuprofen (ADVIL,MOTRIN) 800 MG tablet Take 1 tablet (800 mg total) by mouth every 8 (eight) hours as needed. 02/14/17   Wynetta Fines, MD  JARDIANCE 25 MG TABS tablet Take 25 mg by mouth daily. 02/15/22   [provider]  lisinopril (ZESTRIL) 10 MG tablet Take 10 mg by mouth daily. 02/12/22   [provider]  metFORMIN (GLUCOPHAGE) 1000 MG tablet Take 1,000 mg by mouth 2 (two) times daily. 02/15/22   [provider]  ondansetron (ZOFRAN) 4 MG tablet Take 1 tablet (4 mg total) by mouth every 8 (eight) hours as needed for nausea or vomiting. 02/14/17    Wynetta Fines, MD  sertraline (ZOLOFT) 100 MG tablet Take 200 mg by mouth daily.    [provider]      Allergies    Patient has no known allergies.    Review of Systems   Review of Systems  Physical Exam Updated Vital Signs BP 103/61   Pulse 73   Temp 98.1 F (36.7 C) (Oral)   Resp 19   Wt 117.9 kg   SpO2 92%   BMI 34.30 kg/m  Physical Exam Vitals and nursing note reviewed.  Constitutional:      General: He is not in acute distress.    Appearance: He is well-developed.  HENT:     Head: Normocephalic and atraumatic.  Eyes:     Conjunctiva/sclera: Conjunctivae normal.  Neck:     Comments: No midline spinal tenderness. Cardiovascular:     Rate and Rhythm: Normal rate and regular rhythm.     Heart sounds: No murmur heard. Pulmonary:     Effort: Pulmonary effort is normal. No respiratory distress.     Breath sounds: Normal breath sounds.  Abdominal:     Palpations: Abdomen is soft.     Tenderness: There is no abdominal tenderness.  Musculoskeletal:        General: Tenderness and signs of injury present. No swelling or deformity.     Cervical back: Neck supple. No tenderness.  Comments: Patient complaining of pain with internal/external rotation, abduction of right shoulder.  No deformity noted.  Strong right-sided radial pulse.  No sensation deficit noted.  Skin:    General: Skin is warm and dry.     Capillary Refill: Capillary refill takes less than 2 seconds.  Neurological:     Mental Status: He is alert and oriented to person, place, and time.     Sensory: No sensory deficit.     Motor: No weakness.  Psychiatric:        Mood and Affect: Mood normal.     ED Results / Procedures / Treatments   Labs (all labs ordered are listed, but only abnormal results are displayed) Labs Reviewed  CBC WITH DIFFERENTIAL/PLATELET - Abnormal; Notable for the following components:      Result Value   WBC 10.9 (*)    Eosinophils Absolute 0.9 (*)    All  other components within normal limits  BASIC METABOLIC PANEL - Abnormal; Notable for the following components:   CO2 19 (*)    All other components within normal limits    EKG None  Radiology CT Head Wo Contrast  Result Date: 09/02/2022 CLINICAL DATA:  Trauma, fall from ladder EXAM: CT HEAD WITHOUT CONTRAST CT CERVICAL SPINE WITHOUT CONTRAST TECHNIQUE: Multidetector CT imaging of the head and cervical spine was performed following the standard protocol without intravenous contrast. Multiplanar CT image reconstructions of the cervical spine were also generated. RADIATION DOSE REDUCTION: This exam was performed according to the departmental dose-optimization program which includes automated exposure control, adjustment of the mA and/or kV according to patient size and/or use of iterative reconstruction technique. COMPARISON:  None Available. FINDINGS: CT HEAD FINDINGS Brain: No evidence of acute infarction, hemorrhage, hydrocephalus, extra-axial collection or mass lesion/mass effect. Vascular: No hyperdense vessel or unexpected calcification. Skull: Normal. Negative for fracture or focal lesion. Sinuses/Orbits: Partial opacification of the bilateral ethmoid, left frontal, and left maxillary sinuses. Mastoid air cells are clear. Other: None. CT CERVICAL SPINE FINDINGS Alignment: Normal cervical lordosis. Skull base and vertebrae: No acute fracture. No primary bone lesion or focal pathologic process. Soft tissues and spinal canal: No prevertebral fluid or swelling. No visible canal hematoma. Disc levels: Intervertebral disc spaces are maintained. Spinal canal is patent. Upper chest: Visualized lung apices are clear. Other: Visualized thyroid is unremarkable. IMPRESSION: Normal head CT. Normal cervical spine CT. Electronically Signed   By: Charline Bills M.D.   On: 09/02/2022 22:59   CT Cervical Spine Wo Contrast  Result Date: 09/02/2022 CLINICAL DATA:  Trauma, fall from ladder EXAM: CT HEAD WITHOUT  CONTRAST CT CERVICAL SPINE WITHOUT CONTRAST TECHNIQUE: Multidetector CT imaging of the head and cervical spine was performed following the standard protocol without intravenous contrast. Multiplanar CT image reconstructions of the cervical spine were also generated. RADIATION DOSE REDUCTION: This exam was performed according to the departmental dose-optimization program which includes automated exposure control, adjustment of the mA and/or kV according to patient size and/or use of iterative reconstruction technique. COMPARISON:  None Available. FINDINGS: CT HEAD FINDINGS Brain: No evidence of acute infarction, hemorrhage, hydrocephalus, extra-axial collection or mass lesion/mass effect. Vascular: No hyperdense vessel or unexpected calcification. Skull: Normal. Negative for fracture or focal lesion. Sinuses/Orbits: Partial opacification of the bilateral ethmoid, left frontal, and left maxillary sinuses. Mastoid air cells are clear. Other: None. CT CERVICAL SPINE FINDINGS Alignment: Normal cervical lordosis. Skull base and vertebrae: No acute fracture. No primary bone lesion or focal pathologic process. Soft tissues and spinal  canal: No prevertebral fluid or swelling. No visible canal hematoma. Disc levels: Intervertebral disc spaces are maintained. Spinal canal is patent. Upper chest: Visualized lung apices are clear. Other: Visualized thyroid is unremarkable. IMPRESSION: Normal head CT. Normal cervical spine CT. Electronically Signed   By: Charline Bills M.D.   On: 09/02/2022 22:59   DG Shoulder Right  Result Date: 09/02/2022 CLINICAL DATA:  Fall from ladder, shoulder pain EXAM: RIGHT SHOULDER - 2+ VIEW COMPARISON:  None Available. FINDINGS: No fracture or dislocation is seen. The joint spaces are preserved. The visualized soft tissues are unremarkable. Visualized right lung is clear. IMPRESSION: Negative. Electronically Signed   By: Charline Bills M.D.   On: 09/02/2022 22:58    Procedures .Ortho  Injury Treatment  Date/Time: 09/03/2022 12:07 AM  Performed by: Darrick Grinder, PA-C Authorized by: Darrick Grinder, PA-C   Consent:    Consent obtained:  Verbal   Consent given by:  PatientInjury location: shoulder Location details: right shoulder Injury type: soft tissue Pre-procedure neurovascular assessment: neurovascularly intact Pre-procedure range of motion: reduced Immobilization: sling Splint Applied by: ED Tech Post-procedure neurovascular assessment: post-procedure neurovascularly intact Post-procedure range of motion: unchanged       Medications Ordered in ED Medications  HYDROcodone-acetaminophen (NORCO/VICODIN) 5-325 MG per tablet 1 tablet (1 tablet Oral Given 09/02/22 2352)    ED Course/ Medical Decision Making/ A&P                                 Medical Decision Making Amount and/or Complexity of Data Reviewed Labs: ordered. Radiology: ordered.  Risk Prescription drug management.   Patient presented to the emergency room with a chief complaint of shoulder pain.  Differential diagnosis includes but is not limited to fracture, dislocation, soft tissue injury, and others  The patient has history of left-sided rotator cuff tear with subsequent arthroscopy performed by [facility].  Surgery was in 2018.  I ordered and reviewed labs.  CBC and BMP grossly unremarkable  I ordered and personally interpreted imaging including CT scans of the head and cervical spine without contrast, plain films of the right shoulder.  No acute fracture, dislocation, subluxation, intracranial abnormality noted.  I agree with the radiologist findings.  Based on patient's pain and difficulty with range of motion I feel that his injury is most consistent with a possible rotator cuff tear.  Patient was placed in a sling for support.  I ordered the patient hydrocodone for pain.  Upon reassessment his pain improved  Plan to discharge patient at this time with  recommendations for follow-up with orthopedics for further evaluation of his right shoulder injury.  Patient voices understanding with plan.  Will provide prescription for meloxicam.  Patient has been instructed to avoid NSAIDs while taking this medication.  He understands he may continue to take Tylenol as needed alongside the meloxicam.        Final Clinical Impression(s) / ED Diagnoses Final diagnoses:  Fall, initial encounter  Acute pain of right shoulder due to trauma    Rx / DC Orders ED Discharge Orders          Ordered    meloxicam (MOBIC) 15 MG tablet  Daily        09/02/22 2333              Darrick Grinder, PA-C 09/03/22 0057    Gilda Crease, MD 09/03/22 [phone]

## 2022-09-02 NOTE — ED Provider Notes (Incomplete)
[place] EMERGENCY DEPARTMENT AT [facility] Provider Note   CSN: [id number] Arrival date & time: 09/02/22  2145     History {Add pertinent medical, surgical, social history, OB history to HPI:1} Chief Complaint  Patient presents with  . Fall    [name] [name] is a 47 y.o. male.  Patient presents to the emergency department via EMS complaining of right shoulder pain secondary to a fall.  Patient states he was standing on a 6 foot stepladder, with feet approximately 4 feet off the ground when he fell and landed directly on the right shoulder.  He denies hitting his head, denies neck pain, denies blood thinner usage.  EMS placed the patient in a c-collar as a precaution.  Past medical history significant for traumatic tear of left rotator cuff, tendinitis, type II DM  HPI     Home Medications Prior to Admission medications   Medication Sig Start Date End Date Taking? Authorizing Provider  meloxicam (MOBIC) 15 MG tablet Take 1 tablet (15 mg total) by mouth daily for 15 days. 09/02/22 09/17/22 Yes Darrick Grinder, PA-C  atorvastatin (LIPITOR) 40 MG tablet Take 40 mg by mouth daily. 02/15/22   [provider]  HYDROcodone-acetaminophen (NORCO/VICODIN) 5-325 MG tablet Take 2 tablets by mouth every 4 (four) hours as needed. 02/14/17   Wynetta Fines, MD  ibuprofen (ADVIL,MOTRIN) 200 MG tablet Take 800 mg by mouth as needed for moderate pain.    [provider]  ibuprofen (ADVIL,MOTRIN) 800 MG tablet Take 1 tablet (800 mg total) by mouth every 8 (eight) hours as needed. 02/14/17   Wynetta Fines, MD  JARDIANCE 25 MG TABS tablet Take 25 mg by mouth daily. 02/15/22   [provider]  lisinopril (ZESTRIL) 10 MG tablet Take 10 mg by mouth daily. 02/12/22   [provider]  metFORMIN (GLUCOPHAGE) 1000 MG tablet Take 1,000 mg by mouth 2 (two) times daily. 02/15/22   [provider]  ondansetron (ZOFRAN) 4 MG tablet Take 1 tablet (4 mg total)  by mouth every 8 (eight) hours as needed for nausea or vomiting. 02/14/17   Wynetta Fines, MD  sertraline (ZOLOFT) 100 MG tablet Take 200 mg by mouth daily.    [provider]      Allergies    Patient has no known allergies.    Review of Systems   Review of Systems  Physical Exam Updated Vital Signs BP 103/61   Pulse 73   Temp 98.1 F (36.7 C) (Oral)   Resp 19   Wt 117.9 kg   SpO2 92%   BMI 34.30 kg/m  Physical Exam Vitals and nursing note reviewed.  Constitutional:      General: He is not in acute distress.    Appearance: He is well-developed.  HENT:     Head: Normocephalic and atraumatic.  Eyes:     Conjunctiva/sclera: Conjunctivae normal.  Neck:     Comments: No midline spinal tenderness. Cardiovascular:     Rate and Rhythm: Normal rate and regular rhythm.     Heart sounds: No murmur heard. Pulmonary:     Effort: Pulmonary effort is normal. No respiratory distress.     Breath sounds: Normal breath sounds.  Abdominal:     Palpations: Abdomen is soft.     Tenderness: There is no abdominal tenderness.  Musculoskeletal:        General: Tenderness and signs of injury present. No swelling or deformity.     Cervical  back: Neck supple. No tenderness.     Comments: Patient complaining of pain with internal/external rotation, abduction of right shoulder.  No deformity noted.  Strong right-sided radial pulse.  No sensation deficit noted.  Skin:    General: Skin is warm and dry.     Capillary Refill: Capillary refill takes less than 2 seconds.  Neurological:     Mental Status: He is alert and oriented to person, place, and time.     Sensory: No sensory deficit.     Motor: No weakness.  Psychiatric:        Mood and Affect: Mood normal.     ED Results / Procedures / Treatments   Labs (all labs ordered are listed, but only abnormal results are displayed) Labs Reviewed  CBC WITH DIFFERENTIAL/PLATELET - Abnormal; Notable for the following components:       Result Value   WBC 10.9 (*)    Eosinophils Absolute 0.9 (*)    All other components within normal limits  BASIC METABOLIC PANEL - Abnormal; Notable for the following components:   CO2 19 (*)    All other components within normal limits    EKG None  Radiology CT Head Wo Contrast  Result Date: 09/02/2022 CLINICAL DATA:  Trauma, fall from ladder EXAM: CT HEAD WITHOUT CONTRAST CT CERVICAL SPINE WITHOUT CONTRAST TECHNIQUE: Multidetector CT imaging of the head and cervical spine was performed following the standard protocol without intravenous contrast. Multiplanar CT image reconstructions of the cervical spine were also generated. RADIATION DOSE REDUCTION: This exam was performed according to the departmental dose-optimization program which includes automated exposure control, adjustment of the mA and/or kV according to patient size and/or use of iterative reconstruction technique. COMPARISON:  None Available. FINDINGS: CT HEAD FINDINGS Brain: No evidence of acute infarction, hemorrhage, hydrocephalus, extra-axial collection or mass lesion/mass effect. Vascular: No hyperdense vessel or unexpected calcification. Skull: Normal. Negative for fracture or focal lesion. Sinuses/Orbits: Partial opacification of the bilateral ethmoid, left frontal, and left maxillary sinuses. Mastoid air cells are clear. Other: None. CT CERVICAL SPINE FINDINGS Alignment: Normal cervical lordosis. Skull base and vertebrae: No acute fracture. No primary bone lesion or focal pathologic process. Soft tissues and spinal canal: No prevertebral fluid or swelling. No visible canal hematoma. Disc levels: Intervertebral disc spaces are maintained. Spinal canal is patent. Upper chest: Visualized lung apices are clear. Other: Visualized thyroid is unremarkable. IMPRESSION: Normal head CT. Normal cervical spine CT. Electronically Signed   By: Charline Bills M.D.   On: 09/02/2022 22:59   CT Cervical Spine Wo Contrast  Result Date:  09/02/2022 CLINICAL DATA:  Trauma, fall from ladder EXAM: CT HEAD WITHOUT CONTRAST CT CERVICAL SPINE WITHOUT CONTRAST TECHNIQUE: Multidetector CT imaging of the head and cervical spine was performed following the standard protocol without intravenous contrast. Multiplanar CT image reconstructions of the cervical spine were also generated. RADIATION DOSE REDUCTION: This exam was performed according to the departmental dose-optimization program which includes automated exposure control, adjustment of the mA and/or kV according to patient size and/or use of iterative reconstruction technique. COMPARISON:  None Available. FINDINGS: CT HEAD FINDINGS Brain: No evidence of acute infarction, hemorrhage, hydrocephalus, extra-axial collection or mass lesion/mass effect. Vascular: No hyperdense vessel or unexpected calcification. Skull: Normal. Negative for fracture or focal lesion. Sinuses/Orbits: Partial opacification of the bilateral ethmoid, left frontal, and left maxillary sinuses. Mastoid air cells are clear. Other: None. CT CERVICAL SPINE FINDINGS Alignment: Normal cervical lordosis. Skull base and vertebrae: No acute fracture. No primary bone  lesion or focal pathologic process. Soft tissues and spinal canal: No prevertebral fluid or swelling. No visible canal hematoma. Disc levels: Intervertebral disc spaces are maintained. Spinal canal is patent. Upper chest: Visualized lung apices are clear. Other: Visualized thyroid is unremarkable. IMPRESSION: Normal head CT. Normal cervical spine CT. Electronically Signed   By: Charline Bills M.D.   On: 09/02/2022 22:59   DG Shoulder Right  Result Date: 09/02/2022 CLINICAL DATA:  Fall from ladder, shoulder pain EXAM: RIGHT SHOULDER - 2+ VIEW COMPARISON:  None Available. FINDINGS: No fracture or dislocation is seen. The joint spaces are preserved. The visualized soft tissues are unremarkable. Visualized right lung is clear. IMPRESSION: Negative. Electronically Signed   By:  Charline Bills M.D.   On: 09/02/2022 22:58    Procedures Procedures  {Document cardiac monitor, telemetry assessment procedure when appropriate:1}  Medications Ordered in ED Medications  HYDROcodone-acetaminophen (NORCO/VICODIN) 5-325 MG per tablet 1 tablet (1 tablet Oral Given 09/02/22 2352)    ED Course/ Medical Decision Making/ A&P   {   Click here for ABCD2, HEART and other calculatorsREFRESH Note before signing :1}                              Medical Decision Making Amount and/or Complexity of Data Reviewed Labs: ordered. Radiology: ordered.  Risk Prescription drug management.   Patient presented to the emergency room with a chief complaint of shoulder pain.  Differential diagnosis includes but is not limited to fracture, dislocation, soft tissue injury, and others  The patient has history of left-sided rotator cuff tear with subsequent arthroscopy performed by [facility].  Surgery was in 2018.  I ordered and reviewed labs.  CBC and BMP grossly unremarkable  I ordered and personally interpreted imaging including CT scans of the head and cervical spine without contrast, plain films of the right shoulder.  No acute fracture, dislocation, subluxation, intracranial abnormality noted.  I agree with the radiologist findings.  Based on patient's pain and difficulty with range of motion I feel that his injury is most consistent with a possible rotator cuff tear.  {Document critical care time when appropriate:1} {Document review of labs and clinical decision tools ie heart score, Chads2Vasc2 etc:1}  {Document your independent review of radiology images, and any outside records:1} {Document your discussion with family members, caretakers, and with consultants:1} {Document social determinants of health affecting pt's care:1} {Document your decision making why or why not admission, treatments were needed:1} Final Clinical Impression(s) / ED Diagnoses Final  diagnoses:  None    Rx / DC Orders ED Discharge Orders          Ordered    meloxicam (MOBIC) 15 MG tablet  Daily        09/02/22 2333

## 2022-09-02 NOTE — ED Triage Notes (Signed)
Pt BIB EMS after falling from a 62ft ladder. Pt c/o right shoulder pain. Pt denies neck or back pain, LOC, hitting his head, or blood thinners. GCS 15.     138/88 HR 80 95% RA CBG 98

## 2022-09-02 NOTE — ED Provider Triage Note (Signed)
Emergency Medicine Provider Triage Evaluation Note  [name] [name] , a 47 y.o. male  was evaluated in triage.  Pt complains of fall from a 10 foot ladder.  Patient states that he fell try to put an air conditioning unit on the roof couple of hours ago.  He does admit to alcohol use tonight.  Denies drug use.  Patient states he fell on the right side and hit his head on the ground.  He did not lose consciousness.  Denies any nausea, vomiting.  Mainly complains of right shoulder pain.  Review of Systems  Positive:  Negative: See above   Physical Exam  BP 103/61   Pulse 73   Temp 98.1 F (36.7 C) (Oral)   Resp 19   Wt 117.9 kg   SpO2 92%   BMI 34.30 kg/m  Gen:   Awake, no distress   Resp:  Normal effort  MSK:   Moves extremities without difficulty.  Pelvis is stable and nontender to palpation.  Anterior chest wall is stable and nontender to palpation.  There is no evidence of step-offs or crepitus.  Right shoulder is swollen and tender to palpation.  Good grip strength distally.  2+ radial pulse felt in the right wrist.  Neurologically intact distal to the shoulder. Other:  C-collar in place.  Medical Decision Making  Medically screening exam initiated at 10:08 PM.  Appropriate orders placed.  FERGUSON RENSING was informed that the remainder of the evaluation will be completed by another provider, this initial triage assessment does not replace that evaluation, and the importance of remaining in the ED until their evaluation is complete.  Patient does not meet trauma criteria at this time considering the height was 10 feet.  He is GCS 15.  Labs and imaging ordered.   Honor Loh [place], [place] 09/02/22 2210

## 2022-09-03 NOTE — Discharge Instructions (Signed)
You were evaluated tonight for right shoulder pain.  Your symptoms are consistent with a soft tissue injury.  Your imaging including CT scans of your head and cervical spine and plain x-rays of your right shoulder show no fracture, dislocation, or intracranial abnormality.  The sling is for support.  You may remove it for hygiene/bathing.  I have prescribed meloxicam to be taken once daily with food.  Do not take other NSAID medications while taking this medication.  You may take Tylenol, not to exceed 4000 mg in a 24-hour period.  Please schedule a follow-up appointment with orthopedics for further evaluation and management as needed.

## 2022-09-06 ENCOUNTER — Encounter (HOSPITAL_BASED_OUTPATIENT_CLINIC_OR_DEPARTMENT_OTHER): Payer: Self-pay | Admitting: Student

## 2022-09-06 ENCOUNTER — Ambulatory Visit (INDEPENDENT_AMBULATORY_CARE_PROVIDER_SITE_OTHER): Payer: 59 | Admitting: Student

## 2022-09-06 DIAGNOSIS — M25511 Pain in right shoulder: Secondary | ICD-10-CM

## 2022-09-07 NOTE — Progress Notes (Signed)
Chief Complaint: Right shoulder pain     History of Present Illness:    [name] [name] is a 47 y.o. male presenting today with acute right shoulder pain.  He taken via EMS to the emergency department on 09/02/2022, shortly after he fell about 4 feet off of a ladder directly onto his right shoulder.  X-rays showed no evidence of fracture and he was placed in a sling and given meloxicam prescription.  He continues to have moderate to severe pain, mainly over the anterior shoulder.  He notes significantly decreased range of motion.  Denies any radiating pain, numbness, or tingling.   Surgical History:   Left rotator cuff repair - 2018  PMH/PSH/Family History/Social History/Meds/Allergies:    Past Medical History:  Diagnosis Date   Anxiety    Diabetes mellitus without complication (HCC)    GERD (gastroesophageal reflux disease)    Obesity (BMI 30-39.9)    Plantar fasciitis of right foot    Skin lesion    Tendonitis    Traumatic tear of left rotator cuff 05/20/2016   Past Surgical History:  Procedure Laterality Date   SHOULDER ARTHROSCOPY WITH ROTATOR CUFF REPAIR AND SUBACROMIAL DECOMPRESSION Left 05/21/2016   Procedure: SHOULDER ARTHROSCOPY WITH ROTATOR CUFF REPAIR AND SUBACROMIAL DECOMPRESSION LABRAL DEBRIDEMENT;  Surgeon: Salvatore Marvel, MD;  Location: [facility];  Service: Orthopedics;  Laterality: Left;   VASECTOMY     Social History   Socioeconomic History   Marital status: Married    Spouse name: Not on file   Number of children: 4   Years of education: Not on file   Highest education level: Not on file  Occupational History   Occupation: [employer]  Tobacco Use   Smoking status: Former    Types: Cigarettes   Smokeless tobacco: Former    Types: Snuff  Vaping Use   Vaping status: Never Used  Substance and Sexual Activity   Alcohol use: Yes    Alcohol/week: 0.0 standard drinks of alcohol    Comment: Occas    Drug use: No   Sexual activity: Not on file  Other Topics Concern   Not on file  Social History Narrative   Not on file   Social Determinants of Health   Financial Resource Strain: Not on file  Food Insecurity: Not on file  Transportation Needs: Not on file  Physical Activity: Not on file  Stress: Not on file  Social Connections: Not on file   Family History  Problem Relation Age of Onset   Hypertrophic cardiomyopathy Son    Diabetes type I Son    No Known Allergies Current Outpatient Medications  Medication Sig Dispense Refill   atorvastatin (LIPITOR) 40 MG tablet Take 40 mg by mouth daily.     HYDROcodone-acetaminophen (NORCO/VICODIN) 5-325 MG tablet Take 2 tablets by mouth every 4 (four) hours as needed. 10 tablet 0   ibuprofen (ADVIL,MOTRIN) 200 MG tablet Take 800 mg by mouth as needed for moderate pain.     ibuprofen (ADVIL,MOTRIN) 800 MG tablet Take 1 tablet (800 mg total) by mouth every 8 (eight) hours as needed. 30 tablet 0   JARDIANCE 25 MG TABS tablet Take 25 mg by mouth daily.     lisinopril (ZESTRIL) 10 MG tablet Take 10 mg by mouth daily.  meloxicam (MOBIC) 15 MG tablet Take 1 tablet (15 mg total) by mouth daily for 15 days. 15 tablet 0   metFORMIN (GLUCOPHAGE) 1000 MG tablet Take 1,000 mg by mouth 2 (two) times daily.     ondansetron (ZOFRAN) 4 MG tablet Take 1 tablet (4 mg total) by mouth every 8 (eight) hours as needed for nausea or vomiting. 4 tablet 0   sertraline (ZOLOFT) 100 MG tablet Take 200 mg by mouth daily.     No current facility-administered medications for this visit.   No results found.  Review of Systems:   A ROS was performed including pertinent positives and negatives as documented in the HPI.  Physical Exam :   Constitutional: NAD and appears stated age Neurological: Alert and oriented Psych: Appropriate affect and cooperative There were no vitals taken for this visit.   Comprehensive Musculoskeletal Exam:    Tenderness to  palpation over anterior glenohumeral joint.  Active ROM of the right shoulder to 30 degrees flexion, 20 ER, and IR to L4. Left shoulder able to forward flex to 160 degrees. Special testing limited due to decreased ROM.  Distal neurosensory exam intact.  Imaging:   Xray review from emergency department on 09/02/22 (right shoulder 3 views): Negative    I personally reviewed and interpreted the radiographs.   Assessment:   47 y.o. male with acute right shoulder pain after traumatic fall.  Based on his mechanism and significant deficits with range of motion, I do believe an MRI is indicated at this time.  I will plan to order an MRI arthrogram of the right shoulder to evaluate for a possible labral or rotator cuff injury.  Can continue meloxicam for pain and inflammation control.  Discussed to continue utilizing sling as needed.  Will plan to follow-up shortly after MRI to review results and discuss treatment plan.  Plan :    - Obtain MRI arthrogram of the right shoulder - Return for review and discussion with Dr. Steward Drone     I personally saw and evaluated the patient, and participated in the management and treatment plan.  Hazle Nordmann, PA-C Orthopedics  This document was dictated using Conservation officer, historic buildings. A reasonable attempt at proof reading has been made to minimize errors.

## 2022-09-08 NOTE — Addendum Note (Signed)
Addended by: Jeanella Cara on: 09/08/2022 11:25 AM   Modules accepted: Orders

## 2022-10-20 ENCOUNTER — Ambulatory Visit
Admission: RE | Admit: 2022-10-20 | Discharge: 2022-10-20 | Disposition: A | Discharge status: Home / Self Care | Payer: 59 | Source: Ambulatory Visit | Attending: Student | Admitting: Student

## 2022-10-20 DIAGNOSIS — M25511 Pain in right shoulder: Secondary | ICD-10-CM

## 2022-10-20 MED ORDER — IOPAMIDOL (ISOVUE-M 200) INJECTION 41%
10.0000 mL | Freq: Once | INTRAMUSCULAR | Status: AC
  Administered 2022-10-20: 10 mL via INTRA_ARTICULAR

## 2022-10-27 ENCOUNTER — Ambulatory Visit (HOSPITAL_BASED_OUTPATIENT_CLINIC_OR_DEPARTMENT_OTHER): Payer: 59 | Admitting: Student

## 2022-10-27 ENCOUNTER — Encounter (HOSPITAL_BASED_OUTPATIENT_CLINIC_OR_DEPARTMENT_OTHER): Payer: Self-pay | Admitting: Student

## 2022-10-27 ENCOUNTER — Other Ambulatory Visit (HOSPITAL_BASED_OUTPATIENT_CLINIC_OR_DEPARTMENT_OTHER): Payer: Self-pay

## 2022-10-27 DIAGNOSIS — S46011A Strain of muscle(s) and tendon(s) of the rotator cuff of right shoulder, initial encounter: Secondary | ICD-10-CM | POA: Diagnosis not present

## 2022-10-27 MED ORDER — MELOXICAM 15 MG PO TABS
15.0000 mg | ORAL_TABLET | Freq: Every day | ORAL | 0 refills | Status: AC
  Filled 2022-10-27: qty 14, 14d supply, fill #0

## 2022-10-27 NOTE — Progress Notes (Signed)
Chief Complaint: Right shoulder pain     History of Present Illness:   10/27/22: Patient is here today for MRI review of the right shoulder.  He reports that he continues to have a, consistent ache.  Range of motion has improved some, however he is still unable to reach overhead.  Pain occasionally radiates down the biceps.  He is taking meloxicam and Tylenol for pain.   09/03/2022: [name] [name] is a 47 y.o. male presenting today with acute right shoulder pain.  He taken via EMS to the emergency department on 09/02/2022, shortly after he fell about 4 feet off of a ladder directly onto his right shoulder.  X-rays showed no evidence of fracture and he was placed in a sling and given meloxicam prescription.  He continues to have moderate to severe pain, mainly over the anterior shoulder.  He notes significantly decreased range of motion.  Denies any radiating pain, numbness, or tingling.   Surgical History:   Left rotator cuff repair - 2018  PMH/PSH/Family History/Social History/Meds/Allergies:    Past Medical History:  Diagnosis Date   Anxiety    Diabetes mellitus without complication (HCC)    GERD (gastroesophageal reflux disease)    Obesity (BMI 30-39.9)    Plantar fasciitis of right foot    Skin lesion    Tendonitis    Traumatic tear of left rotator cuff 05/20/2016   Past Surgical History:  Procedure Laterality Date   SHOULDER ARTHROSCOPY WITH ROTATOR CUFF REPAIR AND SUBACROMIAL DECOMPRESSION Left 05/21/2016   Procedure: SHOULDER ARTHROSCOPY WITH ROTATOR CUFF REPAIR AND SUBACROMIAL DECOMPRESSION LABRAL DEBRIDEMENT;  Surgeon: Salvatore Marvel, MD;  Location: [facility];  Service: Orthopedics;  Laterality: Left;   VASECTOMY     Social History   Socioeconomic History   Marital status: Married    Spouse name: Not on file   Number of children: 4   Years of education: Not on file   Highest education level: Not on file  Occupational  History   Occupation: [employer]  Tobacco Use   Smoking status: Former    Types: Cigarettes   Smokeless tobacco: Former    Types: Snuff  Vaping Use   Vaping status: Never Used  Substance and Sexual Activity   Alcohol use: Yes    Alcohol/week: 0.0 standard drinks of alcohol    Comment: Occas   Drug use: No   Sexual activity: Not on file  Other Topics Concern   Not on file  Social History Narrative   Not on file   Social Determinants of Health   Financial Resource Strain: Not on file  Food Insecurity: Not on file  Transportation Needs: Not on file  Physical Activity: Not on file  Stress: Not on file  Social Connections: Not on file   Family History  Problem Relation Age of Onset   Hypertrophic cardiomyopathy Son    Diabetes type I Son    No Known Allergies Current Outpatient Medications  Medication Sig Dispense Refill   meloxicam (MOBIC) 15 MG tablet Take 1 tablet (15 mg total) by mouth daily for 14 days. 14 tablet 0   atorvastatin (LIPITOR) 40 MG tablet Take 40 mg by mouth daily.     HYDROcodone-acetaminophen (NORCO/VICODIN) 5-325 MG tablet Take 2 tablets by mouth every 4 (four) hours as needed.  10 tablet 0   ibuprofen (ADVIL,MOTRIN) 200 MG tablet Take 800 mg by mouth as needed for moderate pain.     ibuprofen (ADVIL,MOTRIN) 800 MG tablet Take 1 tablet (800 mg total) by mouth every 8 (eight) hours as needed. 30 tablet 0   JARDIANCE 25 MG TABS tablet Take 25 mg by mouth daily.     lisinopril (ZESTRIL) 10 MG tablet Take 10 mg by mouth daily.     metFORMIN (GLUCOPHAGE) 1000 MG tablet Take 1,000 mg by mouth 2 (two) times daily.     ondansetron (ZOFRAN) 4 MG tablet Take 1 tablet (4 mg total) by mouth every 8 (eight) hours as needed for nausea or vomiting. 4 tablet 0   sertraline (ZOLOFT) 100 MG tablet Take 200 mg by mouth daily.     No current facility-administered medications for this visit.   No results found.  Review of Systems:   A ROS was performed  including pertinent positives and negatives as documented in the HPI.  Physical Exam :   Constitutional: NAD and appears stated age Neurological: Alert and oriented Psych: Appropriate affect and cooperative There were no vitals taken for this visit.   Comprehensive Musculoskeletal Exam:    Tenderness to palpation over anterior glenohumeral joint and lateral deltoid.  Active ROM of the right shoulder to 80 degrees flexion, 20 ER, and IR to L4.  Distal neurosensory exam intact.  Imaging:   MRI right shoulder Complete retracted tears of the supraspinatus and infraspinatus.  Subscapularis and biceps long head tendinosis.   I personally reviewed and interpreted the radiographs.   Assessment:   47 y.o. male with retracted tears of the supra and infraspinatus.  I have reviewed the images with Dr. Steward Drone and based off the amount of retraction he would likely not be a great candidate for rotator cuff repair.  I did discuss with him that based on his age he would be a better candidate for a superior capsule reconstruction as opposed to reverse total shoulder arthroplasty.  Patient would like to proceed with operative management so I would like for him to get into see Dr. Steward Drone to discuss this in detail and work on getting surgery scheduled.  Will send him in a refill of meloxicam to help with pain in the meantime.  Plan :    - Return for surgical discussion with Dr. Steward Drone     I personally saw and evaluated the patient, and participated in the management and treatment plan.  Hazle Nordmann, PA-C Orthopedics

## 2022-11-10 ENCOUNTER — Ambulatory Visit (HOSPITAL_BASED_OUTPATIENT_CLINIC_OR_DEPARTMENT_OTHER): Payer: 59 | Admitting: Orthopaedic Surgery

## 2022-11-10 ENCOUNTER — Ambulatory Visit: Payer: Self-pay | Admitting: Orthopaedic Surgery

## 2022-11-10 ENCOUNTER — Other Ambulatory Visit (HOSPITAL_BASED_OUTPATIENT_CLINIC_OR_DEPARTMENT_OTHER): Payer: Self-pay

## 2022-11-10 DIAGNOSIS — S46011A Strain of muscle(s) and tendon(s) of the rotator cuff of right shoulder, initial encounter: Secondary | ICD-10-CM

## 2022-11-10 MED ORDER — ASPIRIN 325 MG PO TBEC
325.0000 mg | DELAYED_RELEASE_TABLET | Freq: Every day | ORAL | 0 refills | Status: AC
  Filled 2022-11-10: qty 14, 14d supply, fill #0

## 2022-11-10 MED ORDER — ACETAMINOPHEN 500 MG PO TABS
500.0000 mg | ORAL_TABLET | Freq: Three times a day (TID) | ORAL | 0 refills | Status: AC
  Filled 2022-11-10: qty 30, 10d supply, fill #0

## 2022-11-10 MED ORDER — IBUPROFEN 800 MG PO TABS
800.0000 mg | ORAL_TABLET | Freq: Three times a day (TID) | ORAL | 0 refills | Status: AC
  Filled 2022-11-10: qty 30, 10d supply, fill #0

## 2022-11-10 MED ORDER — OXYCODONE HCL 5 MG PO TABS
5.0000 mg | ORAL_TABLET | ORAL | 0 refills | Status: AC | PRN
  Filled 2022-11-10: qty 15, 3d supply, fill #0

## 2022-11-10 NOTE — Progress Notes (Signed)
Chief Complaint: Right shoulder pain        History of Present Illness:    11/10/2022: Presents today for follow-up of his right shoulder.  He is still having a very difficult time lifting the arm overhead.   [name] [name] is a 47 y.o. male presenting today with acute right shoulder pain.  He taken via EMS to the emergency department on 09/02/2022, shortly after he fell about 4 feet off of a ladder directly onto his right shoulder.  X-rays showed no evidence of fracture and he was placed in a sling and given meloxicam prescription.  He continues to have moderate to severe pain, mainly over the anterior shoulder.  He notes significantly decreased range of motion.  Denies any radiating pain, numbness, or tingling.     Surgical History:   Left rotator cuff repair - 2018   PMH/PSH/Family History/Social History/Meds/Allergies:         Past Medical History:  Diagnosis Date   Anxiety     Diabetes mellitus without complication (HCC)     GERD (gastroesophageal reflux disease)     Obesity (BMI 30-39.9)     Plantar fasciitis of right foot     Skin lesion     Tendonitis     Traumatic tear of left rotator cuff 05/20/2016             Past Surgical History:  Procedure Laterality Date   SHOULDER ARTHROSCOPY WITH ROTATOR CUFF REPAIR AND SUBACROMIAL DECOMPRESSION Left 05/21/2016    Procedure: SHOULDER ARTHROSCOPY WITH ROTATOR CUFF REPAIR AND SUBACROMIAL DECOMPRESSION LABRAL DEBRIDEMENT;  Surgeon: Salvatore Marvel, MD;  Location: [facility];  Service: Orthopedics;  Laterality: Left;   VASECTOMY            Social History         Socioeconomic History   Marital status: Married      Spouse name: Not on file   Number of children: 4   Years of education: Not on file   Highest education level: Not on file  Occupational History   Occupation: [employer]  Tobacco Use   Smoking status: Former      Types: Cigarettes   Smokeless tobacco: Former      Types:  Snuff  Vaping Use   Vaping status: Never Used  Substance and Sexual Activity   Alcohol use: Yes      Alcohol/week: 0.0 standard drinks of alcohol      Comment: Occas   Drug use: No   Sexual activity: Not on file  Other Topics Concern   Not on file  Social History Narrative   Not on file    Social Determinants of Health    Financial Resource Strain: Not on file  Food Insecurity: Not on file  Transportation Needs: Not on file  Physical Activity: Not on file  Stress: Not on file  Social Connections: Not on file         Family History  Problem Relation Age of Onset   Hypertrophic cardiomyopathy Son     Diabetes type I Son          Allergies  No Known Allergies         Current Outpatient Medications  Medication Sig Dispense Refill   atorvastatin (LIPITOR) 40 MG tablet Take 40 mg by mouth daily.       HYDROcodone-acetaminophen (NORCO/VICODIN) 5-325 MG tablet Take 2 tablets by mouth every 4 (four) hours as needed. 10 tablet  0   ibuprofen (ADVIL,MOTRIN) 200 MG tablet Take 800 mg by mouth as needed for moderate pain.       ibuprofen (ADVIL,MOTRIN) 800 MG tablet Take 1 tablet (800 mg total) by mouth every 8 (eight) hours as needed. 30 tablet 0   JARDIANCE 25 MG TABS tablet Take 25 mg by mouth daily.       lisinopril (ZESTRIL) 10 MG tablet Take 10 mg by mouth daily.       meloxicam (MOBIC) 15 MG tablet Take 1 tablet (15 mg total) by mouth daily for 15 days. 15 tablet 0   metFORMIN (GLUCOPHAGE) 1000 MG tablet Take 1,000 mg by mouth 2 (two) times daily.       ondansetron (ZOFRAN) 4 MG tablet Take 1 tablet (4 mg total) by mouth every 8 (eight) hours as needed for nausea or vomiting. 4 tablet 0   sertraline (ZOLOFT) 100 MG tablet Take 200 mg by mouth daily.          No current facility-administered medications for this visit.      Imaging Results (Last 48 hours)  No results found.     Review of Systems:   A ROS was performed including pertinent positives and negatives as  documented in the HPI.   Physical Exam :   Constitutional: NAD and appears stated age Neurological: Alert and oriented Psych: Appropriate affect and cooperative There were no vitals taken for this visit.    Comprehensive Musculoskeletal Exam:     Tenderness to palpation over anterior glenohumeral joint.  Active ROM of the right shoulder to 30 degrees flexion, 20 ER, and IR to L4. Left shoulder able to forward flex to 160 degrees. Special testing limited due to decreased ROM.  Distal neurosensory exam intact.   Imaging:   Xray review from emergency department on 09/02/22 (right shoulder 3 views): Negative   MRI right shoulder: Full-thickness tear of the supraspinatus with retraction to the level of the glenoid.    I personally reviewed and interpreted the radiographs.     Assessment:   47 y.o. male with acute right shoulder pain after traumatic fall.  MRI does confirm a full-thickness supraspinatus tear with retraction of the level of the glenoid.  I did discuss treatment options.  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  I did discuss that due to the severity of the retraction that I would not be completely confident in her repair particularly the amount of tension required to mobilize this in terms of healing.  Given this we did also discuss the possibility of a superior capsular reconstruction and the need for a mini open approach to perform this as well.  I did discuss that we would likely need to plan to proceed with this.  After discussions of the risks and limitations he is elected for this. Plan :     -Plan for right shoulder arthroscopy with superior capsular reconstruction   After a lengthy discussion of treatment options, including risks, benefits, alternatives, complications of surgical and nonsurgical conservative options, the patient elected surgical  repair.   The patient  is aware of the material risks  and complications including, but not limited to injury to adjacent structures, neurovascular injury, infection, numbness, bleeding, implant failure, thermal burns, stiffness, persistent pain, failure to heal, disease transmission from allograft, need for further surgery, dislocation, anesthetic risks, blood clots, risks of  death,and others. The probabilities of surgical success and failure discussed with patient given their particular co-morbidities.The time and nature of expected rehabilitation and recovery was discussed.The patient's questions were all answered preoperatively.  No barriers to understanding were noted. I explained the natural history of the disease process and Rx rationale.  I explained to the patient what I considered to be reasonable expectations given their personal situation.  The final treatment plan was arrived at through a shared patient decision making process model.          I personally saw and evaluated the patient, and participated in the management and treatment plan.

## 2022-11-16 ENCOUNTER — Encounter (HOSPITAL_BASED_OUTPATIENT_CLINIC_OR_DEPARTMENT_OTHER): Payer: Self-pay | Admitting: Orthopaedic Surgery

## 2022-11-17 ENCOUNTER — Other Ambulatory Visit (HOSPITAL_BASED_OUTPATIENT_CLINIC_OR_DEPARTMENT_OTHER): Payer: Self-pay | Admitting: Orthopaedic Surgery

## 2022-11-17 DIAGNOSIS — S46011A Strain of muscle(s) and tendon(s) of the rotator cuff of right shoulder, initial encounter: Secondary | ICD-10-CM

## 2022-11-19 ENCOUNTER — Encounter (HOSPITAL_BASED_OUTPATIENT_CLINIC_OR_DEPARTMENT_OTHER)
Admission: RE | Admit: 2022-11-19 | Discharge: 2022-11-19 | Disposition: A | Discharge status: Home / Self Care | Payer: 59 | Source: Ambulatory Visit | Attending: Orthopaedic Surgery

## 2022-11-19 DIAGNOSIS — Z01812 Encounter for preprocedural laboratory examination: Secondary | ICD-10-CM | POA: Diagnosis present

## 2022-11-19 LAB — BASIC METABOLIC PANEL
Anion gap: 10 (ref 5–15)
BUN: 18 mg/dL (ref 6–20)
CO2: 20 mmol/L — ABNORMAL LOW (ref 22–32)
Calcium: 9 mg/dL (ref 8.9–10.3)
Chloride: 106 mmol/L (ref 98–111)
Creatinine, Ser: 1 mg/dL (ref 0.61–1.24)
GFR, Estimated: >60 mL/min (ref >60)
Glucose, Bld: 109 mg/dL — ABNORMAL HIGH (ref 70–99)
Potassium: 4.7 mmol/L (ref 3.5–5.1)
Sodium: 136 mmol/L (ref 135–145)

## 2022-11-19 NOTE — Progress Notes (Signed)

## 2022-11-23 ENCOUNTER — Ambulatory Visit (HOSPITAL_BASED_OUTPATIENT_CLINIC_OR_DEPARTMENT_OTHER): Payer: 59 | Admitting: Certified Registered"

## 2022-11-23 ENCOUNTER — Encounter (HOSPITAL_BASED_OUTPATIENT_CLINIC_OR_DEPARTMENT_OTHER)
Admission: RE | Disposition: A | Discharge status: Home / Self Care | Payer: Self-pay | Source: Home / Self Care | Attending: Orthopaedic Surgery

## 2022-11-23 ENCOUNTER — Encounter (HOSPITAL_BASED_OUTPATIENT_CLINIC_OR_DEPARTMENT_OTHER): Payer: Self-pay | Admitting: Orthopaedic Surgery

## 2022-11-23 ENCOUNTER — Ambulatory Visit (HOSPITAL_BASED_OUTPATIENT_CLINIC_OR_DEPARTMENT_OTHER)
Admission: RE | Admit: 2022-11-23 | Discharge: 2022-11-23 | Disposition: A | Discharge status: Home / Self Care | Payer: 59 | Attending: Orthopaedic Surgery | Admitting: Orthopaedic Surgery

## 2022-11-23 ENCOUNTER — Other Ambulatory Visit: Payer: Self-pay

## 2022-11-23 DIAGNOSIS — W11XXXA Fall on and from ladder, initial encounter: Secondary | ICD-10-CM | POA: Diagnosis not present

## 2022-11-23 DIAGNOSIS — S46011A Strain of muscle(s) and tendon(s) of the rotator cuff of right shoulder, initial encounter: Secondary | ICD-10-CM | POA: Insufficient documentation

## 2022-11-23 DIAGNOSIS — Z87891 Personal history of nicotine dependence: Secondary | ICD-10-CM | POA: Diagnosis not present

## 2022-11-23 DIAGNOSIS — M7551 Bursitis of right shoulder: Secondary | ICD-10-CM | POA: Diagnosis not present

## 2022-11-23 DIAGNOSIS — M25711 Osteophyte, right shoulder: Secondary | ICD-10-CM | POA: Diagnosis not present

## 2022-11-23 DIAGNOSIS — Z7984 Long term (current) use of oral hypoglycemic drugs: Secondary | ICD-10-CM | POA: Diagnosis not present

## 2022-11-23 DIAGNOSIS — Z6835 Body mass index (BMI) 35.0-35.9, adult: Secondary | ICD-10-CM | POA: Diagnosis not present

## 2022-11-23 DIAGNOSIS — Z01818 Encounter for other preprocedural examination: Secondary | ICD-10-CM

## 2022-11-23 DIAGNOSIS — E669 Obesity, unspecified: Secondary | ICD-10-CM | POA: Insufficient documentation

## 2022-11-23 DIAGNOSIS — I10 Essential (primary) hypertension: Secondary | ICD-10-CM | POA: Insufficient documentation

## 2022-11-23 DIAGNOSIS — M75101 Unspecified rotator cuff tear or rupture of right shoulder, not specified as traumatic: Secondary | ICD-10-CM | POA: Diagnosis not present

## 2022-11-23 DIAGNOSIS — Z833 Family history of diabetes mellitus: Secondary | ICD-10-CM | POA: Diagnosis not present

## 2022-11-23 DIAGNOSIS — Z79899 Other long term (current) drug therapy: Secondary | ICD-10-CM | POA: Insufficient documentation

## 2022-11-23 DIAGNOSIS — E119 Type 2 diabetes mellitus without complications: Secondary | ICD-10-CM | POA: Insufficient documentation

## 2022-11-23 HISTORY — DX: Essential (primary) hypertension: I10

## 2022-11-23 HISTORY — PX: SHOULDER ACROMIOPLASTY: SHX6093

## 2022-11-23 LAB — GLUCOSE, CAPILLARY
Glucose-Capillary: 130 mg/dL — ABNORMAL HIGH (ref 70–99)
Glucose-Capillary: 139 mg/dL — ABNORMAL HIGH (ref 70–99)

## 2022-11-23 SURGERY — ARTHROSCOPY, SHOULDER, WITH SUPERIOR CAPSULE RECONSTRUCTION
Anesthesia: General | Site: Shoulder | Laterality: Right

## 2022-11-23 MED ORDER — PHENYLEPHRINE 80 MCG/ML (10ML) SYRINGE FOR IV PUSH (FOR BLOOD PRESSURE SUPPORT)
PREFILLED_SYRINGE | INTRAVENOUS | Status: AC
  Filled 2022-11-23: qty 10

## 2022-11-23 MED ORDER — CEFAZOLIN SODIUM-DEXTROSE 2-4 GM/100ML-% IV SOLN
INTRAVENOUS | Status: AC
  Filled 2022-11-23: qty 100

## 2022-11-23 MED ORDER — CEFAZOLIN SODIUM-DEXTROSE 2-4 GM/100ML-% IV SOLN
2.0000 g | INTRAVENOUS | Status: AC
  Administered 2022-11-23: 2 g via INTRAVENOUS

## 2022-11-23 MED ORDER — TRANEXAMIC ACID-NACL 1000-0.7 MG/100ML-% IV SOLN
1000.0000 mg | INTRAVENOUS | Status: AC
  Administered 2022-11-23: 1000 mg via INTRAVENOUS

## 2022-11-23 MED ORDER — ROCURONIUM BROMIDE 10 MG/ML (PF) SYRINGE
PREFILLED_SYRINGE | INTRAVENOUS | Status: AC
  Filled 2022-11-23: qty 10

## 2022-11-23 MED ORDER — PHENYLEPHRINE HCL-NACL 20-0.9 MG/250ML-% IV SOLN
INTRAVENOUS | Status: DC | PRN
  Administered 2022-11-23: 20 ug/min via INTRAVENOUS

## 2022-11-23 MED ORDER — MIDAZOLAM HCL 2 MG/2ML IJ SOLN
INTRAMUSCULAR | Status: AC
  Filled 2022-11-23: qty 2

## 2022-11-23 MED ORDER — FENTANYL CITRATE (PF) 100 MCG/2ML IJ SOLN
25.0000 ug | INTRAMUSCULAR | Status: DC | PRN
  Administered 2022-11-23 (×2): 50 ug via INTRAVENOUS

## 2022-11-23 MED ORDER — KETOROLAC TROMETHAMINE 30 MG/ML IJ SOLN: 30.0000 mg | Freq: Once | INTRAMUSCULAR | Status: DC | PRN

## 2022-11-23 MED ORDER — PROPOFOL 10 MG/ML IV BOLUS
INTRAVENOUS | Status: AC
  Filled 2022-11-23: qty 20

## 2022-11-23 MED ORDER — BUPIVACAINE HCL (PF) 0.5 % IJ SOLN
INTRAMUSCULAR | Status: DC | PRN
  Administered 2022-11-23: 20 mL via PERINEURAL

## 2022-11-23 MED ORDER — EPHEDRINE 5 MG/ML INJ
INTRAVENOUS | Status: AC
  Filled 2022-11-23: qty 5

## 2022-11-23 MED ORDER — ACETAMINOPHEN 500 MG PO TABS
ORAL_TABLET | ORAL | Status: AC
  Filled 2022-11-23: qty 2

## 2022-11-23 MED ORDER — PROPOFOL 10 MG/ML IV BOLUS
INTRAVENOUS | Status: AC
Start: 2022-11-23 — End: ?
  Filled 2022-11-23: qty 20

## 2022-11-23 MED ORDER — ACETAMINOPHEN 500 MG PO TABS
1000.0000 mg | ORAL_TABLET | Freq: Once | ORAL | Status: AC
  Administered 2022-11-23: 1000 mg via ORAL

## 2022-11-23 MED ORDER — MIDAZOLAM HCL 2 MG/2ML IJ SOLN
2.0000 mg | Freq: Once | INTRAMUSCULAR | Status: AC
  Administered 2022-11-23: 2 mg via INTRAVENOUS

## 2022-11-23 MED ORDER — ONDANSETRON HCL 4 MG/2ML IJ SOLN
INTRAMUSCULAR | Status: DC | PRN
  Administered 2022-11-23: 4 mg via INTRAVENOUS

## 2022-11-23 MED ORDER — DEXAMETHASONE SODIUM PHOSPHATE 10 MG/ML IJ SOLN
INTRAMUSCULAR | Status: AC
  Filled 2022-11-23: qty 1

## 2022-11-23 MED ORDER — ONDANSETRON HCL 4 MG/2ML IJ SOLN
INTRAMUSCULAR | Status: AC
  Filled 2022-11-23: qty 2

## 2022-11-23 MED ORDER — FENTANYL CITRATE (PF) 100 MCG/2ML IJ SOLN
INTRAMUSCULAR | Status: AC
  Filled 2022-11-23: qty 2

## 2022-11-23 MED ORDER — ROCURONIUM BROMIDE 10 MG/ML (PF) SYRINGE
PREFILLED_SYRINGE | INTRAVENOUS | Status: DC | PRN
  Administered 2022-11-23: 70 mg via INTRAVENOUS

## 2022-11-23 MED ORDER — ONDANSETRON HCL 4 MG/2ML IJ SOLN: 4.0000 mg | Freq: Once | INTRAMUSCULAR | Status: DC | PRN

## 2022-11-23 MED ORDER — LACTATED RINGERS IV SOLN: INTRAVENOUS | Status: DC

## 2022-11-23 MED ORDER — SODIUM CHLORIDE 0.9 % IR SOLN
Status: DC | PRN
  Administered 2022-11-23: 1500 mL

## 2022-11-23 MED ORDER — GABAPENTIN 300 MG PO CAPS
ORAL_CAPSULE | ORAL | Status: AC
  Filled 2022-11-23: qty 1

## 2022-11-23 MED ORDER — TRANEXAMIC ACID-NACL 1000-0.7 MG/100ML-% IV SOLN
INTRAVENOUS | Status: AC
Start: 2022-11-23 — End: ?
  Filled 2022-11-23: qty 100

## 2022-11-23 MED ORDER — PROPOFOL 10 MG/ML IV BOLUS
INTRAVENOUS | Status: DC | PRN
  Administered 2022-11-23: 200 mg via INTRAVENOUS

## 2022-11-23 MED ORDER — OXYCODONE HCL 5 MG PO TABS
ORAL_TABLET | ORAL | Status: AC
  Filled 2022-11-23: qty 1

## 2022-11-23 MED ORDER — OXYCODONE HCL 5 MG/5ML PO SOLN: 5.0000 mg | Freq: Once | ORAL | Status: AC | PRN

## 2022-11-23 MED ORDER — TRANEXAMIC ACID-NACL 1000-0.7 MG/100ML-% IV SOLN
INTRAVENOUS | Status: AC
  Filled 2022-11-23: qty 100

## 2022-11-23 MED ORDER — DEXAMETHASONE SODIUM PHOSPHATE 10 MG/ML IJ SOLN
INTRAMUSCULAR | Status: DC | PRN
  Administered 2022-11-23: 5 mg via INTRAVENOUS

## 2022-11-23 MED ORDER — OXYCODONE HCL 5 MG PO TABS
5.0000 mg | ORAL_TABLET | Freq: Once | ORAL | Status: AC | PRN
  Administered 2022-11-23: 5 mg via ORAL

## 2022-11-23 MED ORDER — BUPIVACAINE LIPOSOME 1.3 % IJ SUSP
INTRAMUSCULAR | Status: DC | PRN
  Administered 2022-11-23: 10 mL via PERINEURAL

## 2022-11-23 MED ORDER — FENTANYL CITRATE (PF) 100 MCG/2ML IJ SOLN
100.0000 ug | Freq: Once | INTRAMUSCULAR | Status: AC
  Administered 2022-11-23: 100 ug via INTRAVENOUS

## 2022-11-23 MED ORDER — GABAPENTIN 300 MG PO CAPS
300.0000 mg | ORAL_CAPSULE | Freq: Once | ORAL | Status: AC
  Administered 2022-11-23: 300 mg via ORAL

## 2022-11-23 MED ORDER — SUGAMMADEX SODIUM 200 MG/2ML IV SOLN
INTRAVENOUS | Status: DC | PRN
  Administered 2022-11-23: 200 mg via INTRAVENOUS

## 2022-11-23 MED ORDER — LIDOCAINE 2% (20 MG/ML) 5 ML SYRINGE
INTRAMUSCULAR | Status: AC
  Filled 2022-11-23: qty 5

## 2022-11-23 MED ORDER — LIDOCAINE 2% (20 MG/ML) 5 ML SYRINGE
INTRAMUSCULAR | Status: DC | PRN
  Administered 2022-11-23: 100 mg via INTRAVENOUS

## 2022-11-23 SURGICAL SUPPLY — 75 items
ANCH SUT FBRTK 1.3X2.6X1.7 SLF (Anchor) ×1 IMPLANT
ANCH SUT TGRTAPE 1.3X2.6X1.7 (Anchor) ×1 IMPLANT
ANCHOR FIBERTAK 2.6X1.7 BLUE (Anchor) IMPLANT
ANCHOR SUT FBRTK 2.6 SP1.7 TAP (Anchor) IMPLANT
ANCHOR SUTTAK DBL LOAD 3X14.5 (Anchor) IMPLANT
ANCHOR SVLK SP PEEK 4.75BLUE#2 (Anchor) IMPLANT
APL PRP STRL LF DISP 70% ISPRP (MISCELLANEOUS) ×1
BLADE EXCALIBUR 4.0X13 (MISCELLANEOUS) IMPLANT
BLADE SHAVER TORPEDO 4X13 (MISCELLANEOUS) ×1 IMPLANT
BLADE SURG 15 STRL LF DISP TIS (BLADE) IMPLANT
BLADE SURG 15 STRL SS (BLADE) ×1
BUR SURG 4D 13L RD FLUTE (BUR) IMPLANT
BURR OVAL 8 FLU 4.0X13 (MISCELLANEOUS) IMPLANT
BURR SURG 4D 13L RD FLUTE (BUR)
CANNULA 5.75X71 LONG (CANNULA) IMPLANT
CANNULA 7X7 TWIST-IN (CANNULA) IMPLANT
CANNULA PASSPORT 5 (CANNULA) IMPLANT
CANNULA PASSPORT BUTTON 10-40 (CANNULA) IMPLANT
CANNULA TWIST IN 8.25X7CM (CANNULA) IMPLANT
CHLORAPREP W/TINT 26 (MISCELLANEOUS) ×1 IMPLANT
COOLER ICEMAN CLASSIC (MISCELLANEOUS) ×1 IMPLANT
DRAPE IMP U-DRAPE 54X76 (DRAPES) ×1 IMPLANT
DRAPE INCISE IOBAN 66X45 STRL (DRAPES) ×1 IMPLANT
DRAPE SHOULDER BEACH CHAIR (DRAPES) ×1 IMPLANT
DRAPE U-SHAPE 47X51 STRL (DRAPES) ×2 IMPLANT
DRSG TEGADERM 4X4.75 (GAUZE/BANDAGES/DRESSINGS) IMPLANT
DW OUTFLOW CASSETTE/TUBE SET (MISCELLANEOUS) ×1 IMPLANT
GAUZE PAD ABD 8X10 STRL (GAUZE/BANDAGES/DRESSINGS) ×1 IMPLANT
GAUZE SPONGE 4X4 12PLY STRL (GAUZE/BANDAGES/DRESSINGS) ×1 IMPLANT
GAUZE XEROFORM 1X8 LF (GAUZE/BANDAGES/DRESSINGS) ×1 IMPLANT
GLOVE BIO SURGEON STRL SZ 6 (GLOVE) ×2 IMPLANT
GLOVE BIO SURGEON STRL SZ7.5 (GLOVE) ×1 IMPLANT
GLOVE BIOGEL PI IND STRL 6.5 (GLOVE) ×1 IMPLANT
GLOVE BIOGEL PI IND STRL 8 (GLOVE) ×1 IMPLANT
GLOVE ECLIPSE 8.0 STRL XLNG CF (GLOVE) ×1 IMPLANT
GOWN STRL REUS W/ TWL LRG LVL3 (GOWN DISPOSABLE) ×2 IMPLANT
GOWN STRL REUS W/TWL LRG LVL3 (GOWN DISPOSABLE) ×2
GOWN STRL REUS W/TWL XL LVL3 (GOWN DISPOSABLE) ×1 IMPLANT
GRAFT TISS 40X70 3 THK DERM (Tissue) IMPLANT
KIT CVD SPEAR FBRTK 1.8 DRILL (KITS) IMPLANT
KIT PERC INSERT 3.0 KNTLS (KITS) IMPLANT
KIT PUSHLOCK 2.9 HIP (KITS) IMPLANT
KIT SHOULDER STAB MARCO (KITS) IMPLANT
KIT STR SPEAR 1.8 FBRTK DISP (KITS) IMPLANT
LASSO 90 CVE QUICKPAS (DISPOSABLE) IMPLANT
LASSO CRESCENT QUICKPASS (SUTURE) IMPLANT
MANIFOLD NEPTUNE II (INSTRUMENTS) ×1 IMPLANT
NDL HD SCORPION MEGA LOADER (NEEDLE) IMPLANT
NDL SAFETY ECLIPSE 18X1.5 (NEEDLE) ×1 IMPLANT
NDL SUT 6 .5 CRC .975X.05 MAYO (NEEDLE) IMPLANT
NEEDLE MAYO TAPER (NEEDLE) ×1
PACK ARTHROSCOPY DSU (CUSTOM PROCEDURE TRAY) ×1 IMPLANT
PACK BASIN DAY SURGERY FS (CUSTOM PROCEDURE TRAY) ×1 IMPLANT
PAD COLD SHLDR WRAP-ON (PAD) ×1 IMPLANT
PENCIL SMOKE EVACUATOR (MISCELLANEOUS) IMPLANT
SHEET MEDIUM DRAPE 40X70 STRL (DRAPES) ×1 IMPLANT
SLEEVE ARM SUSPENSION SYSTEM (MISCELLANEOUS) ×1 IMPLANT
SLEEVE SCD COMPRESS KNEE MED (STOCKING) ×1 IMPLANT
SLING S3 LATERAL DISP (MISCELLANEOUS) ×1 IMPLANT
SUCTION TUBE FRAZIER 12FR DISP (SUCTIONS) IMPLANT
SUT ETHILON 3 0 PS 1 (SUTURE) ×1 IMPLANT
SUT FIBERWIRE #2 38 T-5 BLUE (SUTURE)
SUT VIC AB 0 CT2 27 (SUTURE) IMPLANT
SUT VIC AB 2-0 SH 27 (SUTURE) ×1
SUT VIC AB 2-0 SH 27XBRD (SUTURE) IMPLANT
SUTURE FIBERWR #2 38 T-5 BLUE (SUTURE) IMPLANT
SUTURE TAPE TIGERLINK 1.3MM BL (SUTURE) IMPLANT
SUTURETAPE TIGERLINK 1.3MM BL (SUTURE)
SYR 5ML LL (SYRINGE) ×1 IMPLANT
TISSUE ARTHOFLEX THICK 3MM (Tissue) ×1 IMPLANT
TOWEL GREEN STERILE FF (TOWEL DISPOSABLE) ×2 IMPLANT
TUBE CONNECTING 20X1/4 (TUBING) ×1 IMPLANT
TUBING ARTHROSCOPY IRRIG 16FT (MISCELLANEOUS) ×1 IMPLANT
WAND ABLATOR APOLLO I90 (BUR) ×1 IMPLANT
YANKAUER SUCT BULB TIP NO VENT (SUCTIONS) IMPLANT

## 2022-11-23 NOTE — Anesthesia Postprocedure Evaluation (Signed)
Anesthesia Post Note  Patient: [name] [name]  Procedure(s) Performed: RIGHT SHOULDER ARTHROSCOPY WITH SUPERIOR CAPSULAR RECONSTRUCTION / EXTENSIVE DEBRIDEMENT (Right: Shoulder) RIGHT SHOULDER ACROMIOPLASTY (Right: Shoulder)     Patient location during evaluation: PACU Anesthesia Type: General Level of consciousness: awake and alert Pain management: pain level controlled Vital Signs Assessment: post-procedure vital signs reviewed and stable Respiratory status: spontaneous breathing, nonlabored ventilation, respiratory function stable and patient connected to nasal cannula oxygen Cardiovascular status: blood pressure returned to baseline and stable Postop Assessment: no apparent nausea or vomiting Anesthetic complications: no  No notable events documented.  Last Vitals:  Vitals:   11/23/22 1315 11/23/22 1330  BP: 132/88 130/83  Pulse: 83 79  Resp: 17 17  Temp:    SpO2: 94% 93%    Last Pain:  Vitals:   11/23/22 1330  TempSrc:   PainSc: 5                  [name] S

## 2022-11-23 NOTE — Anesthesia Preprocedure Evaluation (Signed)
Anesthesia Evaluation  Patient identified by MRN, date of birth, ID band Patient awake    Reviewed: Allergy & Precautions, H&P , NPO status , Patient's Chart, lab work & pertinent test results  Airway Mallampati: II  TM Distance: >3 FB Neck ROM: Full    Dental no notable dental hx.    Pulmonary neg pulmonary ROS, Patient abstained from smoking., former smoker   Pulmonary exam normal breath sounds clear to auscultation       Cardiovascular hypertension, Pt. on medications Normal cardiovascular exam Rhythm:Regular Rate:Normal     Neuro/Psych negative neurological ROS  negative psych ROS   GI/Hepatic negative GI ROS, Neg liver ROS,,,  Endo/Other  diabetes  obesity  Renal/GU negative Renal ROS  negative genitourinary   Musculoskeletal negative musculoskeletal ROS (+)    Abdominal   Peds negative pediatric ROS (+)  Hematology negative hematology ROS (+)   Anesthesia Other Findings   Reproductive/Obstetrics negative OB ROS                             Anesthesia Physical Anesthesia Plan  ASA: 2  Anesthesia Plan: General   Post-op Pain Management: Regional block*   Induction: Intravenous  PONV Risk Score and Plan: 2 and Ondansetron, Dexamethasone and Treatment may vary due to age or medical condition  Airway Management Planned: Oral ETT  Additional Equipment:   Intra-op Plan:   Post-operative Plan: Extubation in OR  Informed Consent: I have reviewed the patients History and Physical, chart, labs and discussed the procedure including the risks, benefits and alternatives for the proposed anesthesia with the patient or authorized representative who has indicated his/her understanding and acceptance.     Dental advisory given  Plan Discussed with: CRNA and Surgeon  Anesthesia Plan Comments:        Anesthesia Quick Evaluation

## 2022-11-23 NOTE — Anesthesia Procedure Notes (Signed)
Procedure Name: Intubation Date/Time: 11/23/2022 10:57 AM  Performed by: Alwyn Ren, CRNAPre-anesthesia Checklist: Patient identified, Emergency Drugs available, Suction available and Patient being monitored Patient Re-evaluated:Patient Re-evaluated prior to induction Oxygen Delivery Method: Circle system utilized Preoxygenation: Pre-oxygenation with 100% oxygen Induction Type: IV induction Ventilation: Mask ventilation without difficulty Laryngoscope Size: Mac and 4 Grade View: Grade I Tube type: Oral Tube size: 7.0 mm Number of attempts: 2 Airway Equipment and Method: Stylet and Oral airway Placement Confirmation: ETT inserted through vocal cords under direct vision, positive ETCO2 and breath sounds checked- equal and bilateral Secured at: 23 cm Tube secured with: Tape Dental Injury: Teeth and Oropharynx as per pre-operative assessment  Comments: Attempted x 1 with Miller 2, unable to lift epiglottis. Dr. Okey Dupre with MAC 4 x 1

## 2022-11-23 NOTE — Anesthesia Procedure Notes (Signed)
Anesthesia Procedure Image    

## 2022-11-23 NOTE — Progress Notes (Signed)
Assisted Dr. Kalman Shan with right, interscalene , ultrasound guided block. Side rails up, monitors on throughout procedure. See vital signs in flow sheet. Tolerated Procedure well.

## 2022-11-23 NOTE — H&P (Signed)
Chief Complaint: Right shoulder pain        History of Present Illness:    11/10/2022: Presents today for follow-up of his right shoulder.  He is still having a very difficult time lifting the arm overhead.   [name] [name] is a 47 y.o. male presenting today with acute right shoulder pain.  He taken via EMS to the emergency department on 09/02/2022, shortly after he fell about 4 feet off of a ladder directly onto his right shoulder.  X-rays showed no evidence of fracture and he was placed in a sling and given meloxicam prescription.  He continues to have moderate to severe pain, mainly over the anterior shoulder.  He notes significantly decreased range of motion.  Denies any radiating pain, numbness, or tingling.     Surgical History:   Left rotator cuff repair - 2018   PMH/PSH/Family History/Social History/Meds/Allergies:         Past Medical History:  Diagnosis Date   Anxiety     Diabetes mellitus without complication (HCC)     GERD (gastroesophageal reflux disease)     Obesity (BMI 30-39.9)     Plantar fasciitis of right foot     Skin lesion     Tendonitis     Traumatic tear of left rotator cuff 05/20/2016             Past Surgical History:  Procedure Laterality Date   SHOULDER ARTHROSCOPY WITH ROTATOR CUFF REPAIR AND SUBACROMIAL DECOMPRESSION Left 05/21/2016    Procedure: SHOULDER ARTHROSCOPY WITH ROTATOR CUFF REPAIR AND SUBACROMIAL DECOMPRESSION LABRAL DEBRIDEMENT;  Surgeon: Salvatore Marvel, MD;  Location: [facility];  Service: Orthopedics;  Laterality: Left;   VASECTOMY            Social History         Socioeconomic History   Marital status: Married      Spouse name: Not on file   Number of children: 4   Years of education: Not on file   Highest education level: Not on file  Occupational History   Occupation: [employer]  Tobacco Use   Smoking status: Former      Types: Cigarettes   Smokeless tobacco: Former      Types:  Snuff  Vaping Use   Vaping status: Never Used  Substance and Sexual Activity   Alcohol use: Yes      Alcohol/week: 0.0 standard drinks of alcohol      Comment: Occas   Drug use: No   Sexual activity: Not on file  Other Topics Concern   Not on file  Social History Narrative   Not on file    Social Determinants of Health    Financial Resource Strain: Not on file  Food Insecurity: Not on file  Transportation Needs: Not on file  Physical Activity: Not on file  Stress: Not on file  Social Connections: Not on file         Family History  Problem Relation Age of Onset   Hypertrophic cardiomyopathy Son     Diabetes type I Son          Allergies  No Known Allergies         Current Outpatient Medications  Medication Sig Dispense Refill   atorvastatin (LIPITOR) 40 MG tablet Take 40 mg by mouth daily.       HYDROcodone-acetaminophen (NORCO/VICODIN) 5-325 MG tablet Take 2 tablets by mouth every 4 (four) hours as needed. 10 tablet  0   ibuprofen (ADVIL,MOTRIN) 200 MG tablet Take 800 mg by mouth as needed for moderate pain.       ibuprofen (ADVIL,MOTRIN) 800 MG tablet Take 1 tablet (800 mg total) by mouth every 8 (eight) hours as needed. 30 tablet 0   JARDIANCE 25 MG TABS tablet Take 25 mg by mouth daily.       lisinopril (ZESTRIL) 10 MG tablet Take 10 mg by mouth daily.       meloxicam (MOBIC) 15 MG tablet Take 1 tablet (15 mg total) by mouth daily for 15 days. 15 tablet 0   metFORMIN (GLUCOPHAGE) 1000 MG tablet Take 1,000 mg by mouth 2 (two) times daily.       ondansetron (ZOFRAN) 4 MG tablet Take 1 tablet (4 mg total) by mouth every 8 (eight) hours as needed for nausea or vomiting. 4 tablet 0   sertraline (ZOLOFT) 100 MG tablet Take 200 mg by mouth daily.          No current facility-administered medications for this visit.      Imaging Results (Last 48 hours)  No results found.     Review of Systems:   A ROS was performed including pertinent positives and negatives as  documented in the HPI.   Physical Exam :   Constitutional: NAD and appears stated age Neurological: Alert and oriented Psych: Appropriate affect and cooperative There were no vitals taken for this visit.    Comprehensive Musculoskeletal Exam:     Tenderness to palpation over anterior glenohumeral joint.  Active ROM of the right shoulder to 30 degrees flexion, 20 ER, and IR to L4. Left shoulder able to forward flex to 160 degrees. Special testing limited due to decreased ROM.  Distal neurosensory exam intact.   Imaging:   Xray review from emergency department on 09/02/22 (right shoulder 3 views): Negative   MRI right shoulder: Full-thickness tear of the supraspinatus with retraction to the level of the glenoid.    I personally reviewed and interpreted the radiographs.     Assessment:   47 y.o. male with acute right shoulder pain after traumatic fall.  MRI does confirm a full-thickness supraspinatus tear with retraction of the level of the glenoid.  I did discuss treatment options.  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  I did discuss that due to the severity of the retraction that I would not be completely confident in her repair particularly the amount of tension required to mobilize this in terms of healing.  Given this we did also discuss the possibility of a superior capsular reconstruction and the need for a mini open approach to perform this as well.  I did discuss that we would likely need to plan to proceed with this.  After discussions of the risks and limitations he is elected for this. Plan :     -Plan for right shoulder arthroscopy with superior capsular reconstruction   After a lengthy discussion of treatment options, including risks, benefits, alternatives, complications of surgical and nonsurgical conservative options, the patient elected surgical  repair.   The patient  is aware of the material risks  and complications including, but not limited to injury to adjacent structures, neurovascular injury, infection, numbness, bleeding, implant failure, thermal burns, stiffness, persistent pain, failure to heal, disease transmission from allograft, need for further surgery, dislocation, anesthetic risks, blood clots, risks of  death,and others. The probabilities of surgical success and failure discussed with patient given their particular co-morbidities.The time and nature of expected rehabilitation and recovery was discussed.The patient's questions were all answered preoperatively.  No barriers to understanding were noted. I explained the natural history of the disease process and Rx rationale.  I explained to the patient what I considered to be reasonable expectations given their personal situation.  The final treatment plan was arrived at through a shared patient decision making process model.          I personally saw and evaluated the patient, and participated in the management and treatment plan.

## 2022-11-23 NOTE — Discharge Instructions (Addendum)
Discharge Instructions    Attending Surgeon: Huel Cote, MD Office Phone Number: [phone]   Diagnosis and Procedures:    Surgeries Performed: Right shoulder superior capsular reconstruction  Discharge Plan:    Diet: Resume usual diet. Begin with light or bland foods.  Drink plenty of fluids.  Activity:  Keep sling and dressing in place until your follow up visit in Physical Therapy You are advised to go home directly from the hospital or surgical center. Restrict your activities.  GENERAL INSTRUCTIONS: 1.  Keep your surgical site elevated above your heart for at least 5-7 days or longer to prevent swelling. This will improve your comfort and your overall recovery following surgery.     2. Please call Dr. Serena Croissant office at [phone] with questions Monday-Friday during business hours. If no one answers, please leave a message and someone should get back to the patient within 24 hours. For emergencies please call 911 or proceed to the emergency room.   3. Patient to notify surgical team if experiences any of the following: Bowel/Bladder dysfunction, uncontrolled pain, nerve/muscle weakness, incision with increased drainage or redness, nausea/vomiting and Fever greater than 101.0 F.  Be alert for signs of infection including redness, streaking, odor, fever or chills. Be alert for excessive pain or bleeding and notify your surgeon immediately.  WOUND INSTRUCTIONS:   Leave your dressing/cast/splint in place until your post operative visit.  Keep it clean and dry.  Always keep the incision clean and dry until the staples/sutures are removed. If there is no drainage from the incision you should keep it open to air. If there is drainage from the incision you must keep it covered at all times until the drainage stops  Do not soak in a bath tub, hot tub, pool, lake or other body of water until 21 days after your surgery and your incision is completely dry and healed.  If  you have removable sutures (or staples) they must be removed 10-14 days (unless otherwise instructed) from the day of your surgery.     1)  Elevate the extremity as much as possible.  2)  Keep the dressing clean and dry.  3)  Please call us if the dressing becomes wet or dirty.  4)  If you are experiencing worsening pain or worsening swelling, please call.     MEDICATIONS: Resume all previous home medications at the previous prescribed dose and frequency unless otherwise noted Start taking the  pain medications on an as-needed basis as prescribed  Please taper down pain medication over the next week following surgery.  Ideally you should not require a refill of any narcotic pain medication.  Take pain medication with food to minimize nausea. In addition to the prescribed pain medication, you may take over-the-counter pain relievers such as Tylenol.  Do NOT take additional tylenol if your pain medication already has tylenol in it.  Aspirin 325mg  daily per bottle instructions. Narcotic Policy: Per [facility] clinic policy, our goal is ensure optimal postoperative pain control with a multimodal pain management strategy. For all [facility] patients, our goal is to wean post-operative narcotic medications by 6 weeks post-operatively, and many times sooner. If this is not possible due to utilization of pain medication prior to surgery, your [facility] doctor will support your acute post-operative pain control for the first 6 weeks postoperatively, with a plan to transition you back to your primary pain team following that. Cyndia Skeeters will work to ensure a Therapist, occupational.  FOLLOWUP INSTRUCTIONS: 1. Follow up at the Physical Therapy Clinic 3-4 days following surgery. This appointment should be scheduled unless other arrangements have been made.The Physical Therapy scheduling number is [phone] if an appointment has not already been arranged.  2. Contact Dr. Serena Croissant office during office hours at  [phone] or the practice after hours line at [phone] for non-emergencies. For medical emergencies call 911.   Discharge Location: Home   May take Tylenol after 3pm. Oxycodone taken at 2:00p. May repeat at or after 6pm if needed.   Regional Anesthesia Blocks  1. You may not be able to move or feel the "blocked" extremity after a regional anesthetic block. This may last may last from 3-48 hours after placement, but it will go away. The length of time depends on the medication injected and your individual response to the medication. As the nerves start to wake up, you may experience tingling as the movement and feeling returns to your extremity. If the numbness and inability to move your extremity has not gone away after 48 hours, please call your surgeon.   2. The extremity that is blocked will need to be protected until the numbness is gone and the strength has returned. Because you cannot feel it, you will need to take extra care to avoid injury. Because it may be weak, you may have difficulty moving it or using it. You may not know what position it is in without looking at it while the block is in effect.  3. For blocks in the legs and feet, returning to weight bearing and walking needs to be done carefully. You will need to wait until the numbness is entirely gone and the strength has returned. You should be able to move your leg and foot normally before you try and bear weight or walk. You will need someone to be with you when you first try to ensure you do not fall and possibly risk injury.  4. Bruising and tenderness at the needle site are common side effects and will resolve in a few days.  5. Persistent numbness or new problems with movement should be communicated to the surgeon or the [facility] [phone] [facility] [phone]).   Post Anesthesia Home Care Instructions  Activity: Get plenty of rest for the remainder of the day. A  responsible individual must stay with you for 24 hours following the procedure.  For the next 24 hours, DO NOT: -Drive a car -Advertising copywriter -Drink alcoholic beverages -Take any medication unless instructed by your physician -Make any legal decisions or sign important papers.  Meals: Start with liquid foods such as gelatin or soup. Progress to regular foods as tolerated. Avoid greasy, spicy, heavy foods. If nausea and/or vomiting occur, drink only clear liquids until the nausea and/or vomiting subsides. Call your physician if vomiting continues.  Special Instructions/Symptoms: Your throat may feel dry or sore from the anesthesia or the breathing tube placed in your throat during surgery. If this causes discomfort, gargle with warm salt water. The discomfort should disappear within 24 hours.  If you had a scopolamine patch placed behind your ear for the management of post- operative nausea and/or vomiting:  1. The medication in the patch is effective for 72 hours, after which it should be removed.  Wrap patch in a tissue and discard in the trash. Wash hands thoroughly with soap and water. 2. You may remove the patch earlier than 72 hours if you experience unpleasant side effects  which may include dry mouth, dizziness or visual disturbances. 3. Avoid touching the patch. Wash your hands with soap and water after contact with the patch.     Information for Discharge Teaching: EXPAREL (bupivacaine liposome injectable suspension)   Pain relief is important to your recovery. The goal is to control your pain so you can move easier and return to your normal activities as soon as possible after your procedure. Your physician may use several types of medicines to manage pain, swelling, and more.  Your surgeon or anesthesiologist gave you EXPAREL(bupivacaine) to help control your pain after surgery.  EXPAREL is a local anesthetic designed to release slowly over an extended period of time to  provide pain relief by numbing the tissue around the surgical site. EXPAREL is designed to release pain medication over time and can control pain for up to 72 hours. Depending on how you respond to EXPAREL, you may require less pain medication during your recovery. EXPAREL can help reduce or eliminate the need for opioids during the first few days after surgery when pain relief is needed the most. EXPAREL is not an opioid and is not addictive. It does not cause sleepiness or sedation.   Important! A teal colored band has been placed on your arm with the date, time and amount of EXPAREL you have received. Please leave this armband in place for the full 96 hours following administration, and then you may remove the band. If you return to the hospital for any reason within 96 hours following the administration of EXPAREL, the armband provides important information that your health care providers to know, and alerts them that you have received this anesthetic.    Possible side effects of EXPAREL: Temporary loss of sensation or ability to move in the area where medication was injected. Nausea, vomiting, constipation Rarely, numbness and tingling in your mouth or lips, lightheadedness, or anxiety may occur. Call your doctor right away if you think you may be experiencing any of these sensations, or if you have other questions regarding possible side effects.  Follow all other discharge instructions given to you by your surgeon or nurse. Eat a healthy diet and drink plenty of water or other fluids.

## 2022-11-23 NOTE — Interval H&P Note (Signed)
History and Physical Interval Note:  11/23/2022 9:00 AM  [name] [name]  has presented today for surgery, with the diagnosis of RIGHT IRREPARABLE ROTATOR CUFF.  The various methods of treatment have been discussed with the patient and family. After consideration of risks, benefits and other options for treatment, the patient has consented to  Procedure(s): RIGHT SHOULDER ARTHROSCOPY WITH SUPERIOR CAPSULAR RECONSTRUCTION / EXTENSIVE DEBRIDEMENT (Right) RIGHT SHOULDER ACROMIOPLASTY (Right) as a surgical intervention.  The patient's history has been reviewed, patient examined, no change in status, stable for surgery.  I have reviewed the patient's chart and labs.  Questions were answered to the patient's satisfaction.     Huel Cote

## 2022-11-23 NOTE — Op Note (Signed)
Date of Surgery: 11/23/2022  INDICATIONS: [name] [name] is a 47 y.o.-year-old male with right shoulder full thickness rotator cuff tear.  The risk and benefits of the procedure were discussed in detail and documented in the pre-operative evaluation.   PREOPERATIVE DIAGNOSIS: 1. Right shoulder repairable massive rotator cuff tear  POSTOPERATIVE DIAGNOSIS: Same.  PROCEDURE: 1. Right shoulder mini open superior capsular reconstruction 2. Right shoulder acromioplasty 3. Right shoulder extensive debridement of rotator interval, anterior, superior, posterior labrum  SURGEON: Benancio Deeds MD  ASSISTANT: Kerby Less, ATC  ANESTHESIA:  general plus interscalene nerve block  IV FLUIDS AND URINE: See anesthesia record.  ANTIBIOTICS: Ancef  ESTIMATED BLOOD LOSS: 25 mL.  IMPLANTS:  Implant Name Type Inv. Item Serial No. Manufacturer Lot No. LRB No. Used Action  TISSUE ARTHOFLEX THICK - [id number] Tissue TISSUE ARTHOFLEX THICK [id number] [facility] [id number] Right 1 Implanted  BIOCOMPOSITE SUTURETAK, DOUBLE LOADED 3 MM X 14.5 MM    [employer] [id number] Right 2 Implanted  [facility] SUT FBRTK 1.3X2.6X1.7 SLF - [id number] Anchor ANCH SUT FBRTK 1.3X2.6X1.7 SLF  [employer] [id number] Right 1 Implanted  ANCH SUT FBRTK 1.3X2.6X1.7 SLF - [id number] Anchor ANCH SUT FBRTK 1.3X2.6X1.7 SLF  [employer] [id number] Right 1 Implanted    DRAINS: None  CULTURES: None  COMPLICATIONS: none  DESCRIPTION OF PROCEDURE:  Exam under anesthesia revealed 150 degrees of forward elevation with 40 degrees external rotation.  Arthroscopic findings demonstrated:  Glenoid cartilage: Intact Humeral head: Intact Labrum: Intact Biceps insertion: Intact Biceps tendon: Intact Subscapularis insertion: Intact Rotator cuff: Full-thickness supraspinatus tear with retraction to the level and past level of the glenoid Subacromial space: Subacromial bursitis  Informed consent and initialing of the shoulder  in preoperative holding.  Interscalene block anesthesia.  The patient in a sitting position.  Sterile prep and drape of the shoulder and upper extremity.  Antibiotics were given within 1 hour of skin incision.  Time-out prior to the skin incision.   The arthroscope was introduced in the glenohumeral joint from a posterior portal.  An anterior portal was created.  The shoulder was examined and the above findings were noted.    With an arthroscopic shaver and an ArthroCare wand, synovitis throughout the shoulder was resected.  The arthroscopic shaver was used to excise torn portions of the labrum back to a stable margin.   The rotator cuff was approached through the subacromial space.  Anterior, anterolateral, posterolateral, and posterior portals were used.  Bursectomy was performed with an arthroscopic shaver and radiofrequency wand.  The osteophytes on the undersurface of the acromion and clavicle were resected with the arthroscopic shaver.  This flattened the entire undersurface of the acromion into a type 1 configuration.  Soft tissue was removed from the greater tuberosity footprint, along with osteophytes that had formed.  This was done with an arthroscopic shaver.  The footprint was abraded with the arthroscopic shaver to create a bleeding bony surface to encourage healing.   The 2 medial row glenoid anchors were placed with arthroscopic visualization.  These were placed at 2 and 10:00.  The drill was introduced percutaneously and a hard body suture tack anchor was then placed double loaded.  At this time a mini open approach was utilized.  15 blade was used to incise along the lateral portion of the chromium.  The deltoid was incised in line with the fatty raphae.  Gelpi was used to visualize this.  The subacromial bursa was excised with electrocautery.  The rotator  cuff was seen.  I then sequentially retrieved medial row sutures out of the lateral mid knee open approach.  A double row type repair  was utilized with 2 medial row all suture anchors.  The scorpion was used to replace these.  The medial row anchors were also placed into the dermal allograft using the scorpion.  These were parachuted down and tied with a arthroscopic knot pusher over the medial glenoid.  At this time the tension of the graft was visualized and some of the graft laterally was excised in order to be appropriately tension.  The medial row humeral head anchors were then passed up through the graft.  These had excellent tension.  These were placed into 2 lateral row swivel lock anchors my first punching, tensioning and then placing the anchor.  At this time a free #2 nonabsorbable suture FiberWire tape was used to approximate the infraspinatus to this graft with side-to-side figure-of-eight stitches.  This was done anteriorly as well.  The shoulder was irrigated.  The arthroscopic instruments were removed.  Wounds were closed with 0 Vicryl, 2-0 Vicryl and 3-0 nylon sutures.  A sterile dressing was applied.  The upper extremity was placed in a shoulder immobilizer.  The patient tolerated the procedure well and was taken to the recovery room in stable condition.     POSTOPERATIVE PLAN: He will follow the rotator cuff repair protocol.  I will plan to see him back in 2 weeks for suture removal.  He will placed on aspirin for blood clot prevention  Benancio Deeds, MD 12:25 PM

## 2022-11-23 NOTE — Anesthesia Procedure Notes (Addendum)
Anesthesia Regional Block: Interscalene brachial plexus block   Pre-Anesthetic Checklist: , timeout performed,  Correct Patient, Correct Site, Correct Laterality,  Correct Procedure, Correct Position, site marked,  Risks and benefits discussed,  Surgical consent,  Pre-op evaluation,  At surgeon's request and post-op pain management  Laterality: Right  Prep: chloraprep       Needles:  Injection technique: Single-shot  Needle Type: Echogenic Needle     Needle Length: 9cm      Additional Needles:   Procedures:,,,, ultrasound used (permanent image in chart),,    Narrative:  Start time: 11/23/2022 9:32 AM End time: 11/23/2022 9:41 AM Injection made incrementally with aspirations every 5 mL.  Performed by: Personally  Anesthesiologist: Eilene Ghazi, MD  Additional Notes: Patient tolerated the procedure well without complications

## 2022-11-23 NOTE — Brief Op Note (Signed)
   Brief Op Note  Date of Surgery: 11/23/2022  Preoperative Diagnosis: RIGHT IRREPARABLE ROTATOR CUFF  Postoperative Diagnosis: same  Procedure: Procedure(s): RIGHT SHOULDER ARTHROSCOPY WITH SUPERIOR CAPSULAR RECONSTRUCTION / EXTENSIVE DEBRIDEMENT RIGHT SHOULDER ACROMIOPLASTY  Implants: Implant Name Type Inv. Item Serial No. Manufacturer Lot No. LRB No. Used Action  TISSUE ARTHOFLEX THICK - [id number] Tissue TISSUE ARTHOFLEX THICK [id number] [facility] [id number] Right 1 Implanted  BIOCOMPOSITE SUTURETAK, DOUBLE LOADED 3 MM X 14.5 MM    [employer] [id number] Right 2 Implanted  [facility] SUT FBRTK 1.3X2.6X1.7 SLF - [id number] Anchor ANCH SUT FBRTK 1.3X2.6X1.7 SLF  [employer] [id number] Right 1 Implanted  [facility] SUT FBRTK 1.3X2.6X1.7 SLF - [id number] Anchor ANCH SUT FBRTK 1.3X2.6X1.7 SLF  [employer] [id number] Right 1 Implanted    Surgeons: Surgeon(s): Huel Cote, MD  Anesthesia: General    Estimated Blood Loss: See anesthesia record  Complications: None  Condition to PACU: Stable  Benancio Deeds, MD 11/23/2022 12:25 PM

## 2022-11-23 NOTE — Transfer of Care (Signed)
Immediate Anesthesia Transfer of Care Note  Patient: [name] [name]  Procedure(s) Performed: RIGHT SHOULDER ARTHROSCOPY WITH SUPERIOR CAPSULAR RECONSTRUCTION / EXTENSIVE DEBRIDEMENT (Right: Shoulder) RIGHT SHOULDER ACROMIOPLASTY (Right: Shoulder)  Patient Location: PACU  Anesthesia Type:General  Level of Consciousness: awake and sedated  Airway & Oxygen Therapy: Patient Spontanous Breathing and Patient connected to face mask oxygen  Post-op Assessment: Report given to RN and Post -op Vital signs reviewed and stable  Post vital signs: Reviewed and stable  Last Vitals:  Vitals Value Taken Time  BP 128/75 11/23/22 1252  Temp    Pulse 78 11/23/22 1255  Resp 19 11/23/22 1255  SpO2 98 % 11/23/22 1255  Vitals shown include unfiled device data.  Last Pain:  Vitals:   11/23/22 0838  TempSrc: Oral         Complications: No notable events documented.

## 2022-11-24 ENCOUNTER — Encounter (HOSPITAL_BASED_OUTPATIENT_CLINIC_OR_DEPARTMENT_OTHER): Payer: Self-pay | Admitting: Orthopaedic Surgery

## 2022-11-25 NOTE — Therapy (Signed)
OUTPATIENT PHYSICAL THERAPY SHOULDER EVALUATION   Patient Name: [name] [name] MRN: [id number] DOB:July 01, 1975, 47 y.o., male Today's Date: 11/26/2022  END OF SESSION:  PT End of Session - 11/26/22 1101     Visit Number 1    Number of Visits 25    Date for PT Re-Evaluation 02/18/23    Authorization Type [employer]    Authorization Time Period 60VL no auth required    Progress Note Due on Visit 10    PT Start Time 1102    PT Stop Time 1140    PT Time Calculation (min) 38 min    Activity Tolerance Patient tolerated treatment well;No increased pain    Behavior During Therapy WFL for tasks assessed/performed             Past Medical History:  Diagnosis Date   Anxiety    Diabetes mellitus without complication (HCC)    GERD (gastroesophageal reflux disease)    Hypertension    Obesity (BMI 30-39.9)    Plantar fasciitis of right foot    Skin lesion    Tendonitis    Traumatic tear of left rotator cuff 05/20/2016   Past Surgical History:  Procedure Laterality Date   SHOULDER ACROMIOPLASTY Right 11/23/2022   Procedure: RIGHT SHOULDER ACROMIOPLASTY;  Surgeon: Huel Cote, MD;  Location: [facility];  Service: Orthopedics;  Laterality: Right;   SHOULDER ARTHROSCOPY WITH ROTATOR CUFF REPAIR AND SUBACROMIAL DECOMPRESSION Left 05/21/2016   Procedure: SHOULDER ARTHROSCOPY WITH ROTATOR CUFF REPAIR AND SUBACROMIAL DECOMPRESSION LABRAL DEBRIDEMENT;  Surgeon: Salvatore Marvel, MD;  Location: [facility];  Service: Orthopedics;  Laterality: Left;   VASECTOMY     Patient Active Problem List   Diagnosis Date Noted   Traumatic complete tear of right rotator cuff 11/23/2022   Lab test positive for detection of COVID-19 virus 02/23/2022   Paresthesia 10/23/2018   Traumatic tear of left rotator cuff 05/20/2016   Family history of hypertrophic cardiomyopathy 08-14-2014   Family history of sudden cardiac death in son 08/14/14    PCP: Farris Has, MD  REFERRING  PROVIDER: Huel Cote, MD  REFERRING DIAG: [phone] (ICD-10-CM) - Traumatic complete tear of right rotator cuff, initial encounter  THERAPY DIAG:  Right shoulder pain, unspecified chronicity  Muscle weakness (generalized)  Stiffness of right shoulder, not elsewhere classified  Rationale for Evaluation and Treatment: Rehabilitation  ONSET DATE: 11/23/22 rotator cuff repair, superior capsular reconstruction, acromioplasty  SUBJECTIVE:                                                                                                                                                                                      SUBJECTIVE  STATEMENT: In August pt was working on a ladder, fell backwards but unsure quite how he landed. States he did have a mild concussion but that has since resolved. Previously independent and active for work. Since surgery on 11/23/22, has been receiving assistance from wife and kids for daily tasks. Sleeping in recliner with sling and pillow under arm. Icing 3-4x/day ~35min.  Hand dominance: Right  PERTINENT HISTORY: BIL traumatic RC tears, anxiety, DM, GERD, HTN   PAIN:  Are you having pain: no pain at present Location/description: occasional burning and popping; R shoulder, superiorly Best-worst over past week: 0-7/10  - aggravating factors: sleeping and mornings,  - Easing factors: sling, medication    PRECAUTIONS: s/p R RCR 11/23/22; using Dr. Steward Drone rotator cuff repair protocol per op note   WEIGHT BEARING RESTRICTIONS: Yes NWB surgical limb  FALLS:  Has patient fallen in last 6 months? Yes. 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 Lifts and carries up to ~50#  PLOF: Independent  PATIENT GOALS: get back to playing golf, shooting pool  NEXT MD VISIT: Nov 18th   OBJECTIVE:  Note: Objective measures were completed at Evaluation unless otherwise noted.  DIAGNOSTIC FINDINGS:  S/p shoulder surgery 11/23/22  PATIENT SURVEYS:  FOTO 29>64 predicted  COGNITION: Overall cognitive status: Within functional limits for tasks assessed     SENSATION/OBSERVATION: Surgical site obscured by bandaging Pt denies any sensory complaints since nerve block wore off  POSTURE: Sling RUE, increased R UT elevation  UPPER EXTREMITY ROM:  ROM Right eval Left eval  Shoulder flexion    Shoulder abduction    Shoulder internal rotation    Shoulder external rotation    Elbow flexion    Elbow extension    Wrist flexion    Wrist extension     (Blank rows = not tested) (Key: WFL = within functional limits not formally assessed, * = concordant pain, s = stiffness/stretching sensation, NT = not tested)  Comments: deferred given acuity of surgery, phase 1 on eval, elbow flex/ext AAROM grossly WNL  UPPER EXTREMITY MMT:  MMT Right eval Left eval  Shoulder flexion    Shoulder extension    Shoulder abduction    Shoulder extension    Shoulder internal rotation    Shoulder external rotation    Elbow flexion    Elbow extension    Grip strength    (Blank rows = not tested)  (Key: WFL = within functional limits not formally assessed, * = concordant pain, s = stiffness/stretching sensation, NT = not tested)  Comments: deferred given acuity of surgery  SHOULDER SPECIAL TESTS: Deferred given acuity of surgery  JOINT MOBILITY TESTING:  Deferred given acuity of surgery    TODAY'S TREATMENT:  [facility] Adult PT Treatment:                                                DATE:  11/26/22 Therapeutic Exercise: Elbow flex/extension x8, AAROM supported with contralateral UE Wrist flex/extension AROM x8  HEP handout + education, emphasis on safe performance and appropriate setup     PATIENT EDUCATION: Education details: Pt education on PT impairments, prognosis, and POC. Informed consent. Rationale for interventions, safe/appropriate HEP performance Person educated: Patient Education method: Explanation, Demonstration, Tactile cues, Verbal cues, and Handouts Education comprehension: verbalized understanding, returned demonstration, verbal cues required, tactile cues required, and needs further education    HOME EXERCISE PROGRAM: Access Code: [email or web address] URL: [email or web address] Date: 11/26/2022 Prepared by: Fransisco Hertz  Program Notes - with seated elbow flexion, work on gently straightening and bending the elbow with the support of the other arm  Exercises - Seated Elbow Flexion AAROM  - 2-3 x daily - 1 sets - 5-8 reps - Wrist AROM Flexion Extension  - 2-3 x daily - 1 sets - 5-8 reps - Ball Squeeze With Shoulder Sling  - 2-3 x daily - 1 sets - 10 reps  ASSESSMENT:  CLINICAL IMPRESSION: Patient is a pleasant 47 y.o. gentleman who was seen today for physical therapy evaluation and treatment for R shoulder arthroscopy with superior capsular reconstruction/debridement and acromioplasty on 11/23/22, per op note pt will follow Dr. Steward Drone rotator cuff repair protocol. Pt endorses limitations in daily tasks as expected post operatively, arrives in sling on surgical limb. Examination is limited given acuity of surgery with pt in phase 1 per protocol, subsequently deferred formal measurements, plan to assess in phase 2. Today focusing on reinforcing post op precautions, safety, and initiation of elbow/wrist ROM exercises which pt tolerates well. Describes mild transient soreness that resolves with rest. No adverse events. Recommend skilled PT to progress pt  through shoulder protocol with aim of maximizing shoulder ROM and strength given heavy physical demands of pt occupation. Pt departs today's session in no acute distress, all voiced questions/concerns addressed appropriately from PT perspective.    OBJECTIVE IMPAIRMENTS: decreased activity tolerance, decreased endurance, decreased mobility, decreased ROM, decreased strength, impaired perceived functional ability, impaired flexibility, impaired UE functional use, postural dysfunction, and pain.   ACTIVITY LIMITATIONS: carrying, lifting, bending, sleeping, transfers, bathing, toileting, dressing, self feeding, reach over head, and hygiene/grooming  PARTICIPATION LIMITATIONS: meal prep, cleaning, laundry, driving, shopping, community activity, occupation, and yard work  PERSONAL FACTORS: Time since onset of injury/illness/exacerbation and 3+ comorbidities: anxiety, DM, HTN  are also affecting patient's functional outcome.   REHAB POTENTIAL: Good  CLINICAL DECISION MAKING: Stable/uncomplicated  EVALUATION COMPLEXITY: Low   GOALS: Goals reviewed with patient? No  SHORT TERM GOALS: Target date: 01/06/2023 Pt will demonstrate appropriate understanding and performance of initially prescribed HEP in order to facilitate improved independence with management of symptoms.  Baseline: HEP provided on eval Goal status: INITIAL   2. Pt will score greater than or equal to 45 on FOTO in order to demonstrate improved perception of function due to symptoms.  Baseline: 29  Goal status: INITIAL    LONG TERM GOALS: Target date: 02/17/2023   Pt will score 64 on FOTO in order to demonstrate improved perception of function due to symptoms. Baseline: 29 Goal status: INITIAL  2.  Pt will demonstrate at least 150 degrees of  active shoulder elevation in order to demonstrate improved tolerance to functional movement patterns such as overhead reaching. Baseline: deferred on eval given acuity of surgery Goal  status: INITIAL  3.  Pt will demonstrate at least 4+/5 shoulder flex/abduction MMT for improved symmetry of UE strength and improved tolerance to functional movements.  Baseline: deferred on eval given acuity of surgery Goal status: INITIAL  4. Pt will report/demonstrate ability to perform upper body dressing with less than 2 point increase in pain on NPS in order to indicate improved tolerance/independence to ADLs.  Baseline: unable to perform, requiring family assist  Goal status: INITIAL   5. Pt will be able to lift at least 40# from floor to waist with less than 2 pt increase in pain in order to facilitate improved tolerance to work tasks.  Baseline: unable  Goal status: INITIAL  PLAN:  PT FREQUENCY: 2x/week  PT DURATION: 12 weeks  PLANNED INTERVENTIONS: 97164- PT Re-evaluation, 97110-Therapeutic exercises, 97530- Therapeutic activity, 97112- Neuromuscular re-education, 97535- Self Care, 16109- Manual therapy, 97014- Electrical stimulation (unattended), 97016- Vasopneumatic device, Patient/Family education, Balance training, Stair training, Taping, Dry Needling, Joint mobilization, Spinal mobilization, Scar mobilization, Cryotherapy, and Moist heat  PLAN FOR NEXT SESSION: review/update HEP PRN. PT to assess PROM with transition to phase 2 (week 1-4 post op per [facility] protocol). True PROM: limited 140 deg forward flexion, 40 ER at side, abduction 60-80 deg max without rotation. Continuing elbow PROM/AROM and gripping exercises   Ashley Murrain PT, DPT 11/26/2022 1:42 PM

## 2022-11-26 ENCOUNTER — Ambulatory Visit: Payer: 59 | Attending: Orthopaedic Surgery | Admitting: Physical Therapy

## 2022-11-26 ENCOUNTER — Encounter: Payer: Self-pay | Admitting: Physical Therapy

## 2022-11-26 ENCOUNTER — Other Ambulatory Visit: Payer: Self-pay

## 2022-11-26 DIAGNOSIS — M25511 Pain in right shoulder: Secondary | ICD-10-CM | POA: Insufficient documentation

## 2022-11-26 DIAGNOSIS — S46011A Strain of muscle(s) and tendon(s) of the rotator cuff of right shoulder, initial encounter: Secondary | ICD-10-CM | POA: Insufficient documentation

## 2022-11-26 DIAGNOSIS — M6281 Muscle weakness (generalized): Secondary | ICD-10-CM | POA: Insufficient documentation

## 2022-11-26 DIAGNOSIS — M25611 Stiffness of right shoulder, not elsewhere classified: Secondary | ICD-10-CM | POA: Diagnosis present

## 2022-11-27 ENCOUNTER — Ambulatory Visit (HOSPITAL_BASED_OUTPATIENT_CLINIC_OR_DEPARTMENT_OTHER): Payer: 59 | Admitting: Physical Therapy

## 2022-11-29 ENCOUNTER — Ambulatory Visit: Payer: 59 | Admitting: Physical Therapy

## 2022-12-01 ENCOUNTER — Ambulatory Visit: Payer: 59 | Admitting: Physical Therapy

## 2022-12-01 ENCOUNTER — Encounter: Payer: Self-pay | Admitting: Physical Therapy

## 2022-12-01 DIAGNOSIS — M25511 Pain in right shoulder: Secondary | ICD-10-CM

## 2022-12-01 DIAGNOSIS — M25611 Stiffness of right shoulder, not elsewhere classified: Secondary | ICD-10-CM

## 2022-12-01 DIAGNOSIS — M6281 Muscle weakness (generalized): Secondary | ICD-10-CM

## 2022-12-01 NOTE — Therapy (Signed)
OUTPATIENT PHYSICAL THERAPY SHOULDER TREATMENT   Patient Name: [name] [name] MRN: [id number] DOB:10-15-1975, 47 y.o., male Today's Date: 12/01/2022  END OF SESSION:  PT End of Session - 12/01/22 0850     Visit Number 2    Number of Visits 25    Date for PT Re-Evaluation 02/18/23    Authorization Type UHC    Authorization Time Period 60VL no auth required    Progress Note Due on Visit 10    PT Start Time 0845    PT Stop Time 0910    PT Time Calculation (min) 25 min             Past Medical History:  Diagnosis Date   Anxiety    Diabetes mellitus without complication (HCC)    GERD (gastroesophageal reflux disease)    Hypertension    Obesity (BMI 30-39.9)    Plantar fasciitis of right foot    Skin lesion    Tendonitis    Traumatic tear of left rotator cuff 05/20/2016   Past Surgical History:  Procedure Laterality Date   SHOULDER ACROMIOPLASTY Right 11/23/2022   Procedure: RIGHT SHOULDER ACROMIOPLASTY;  Surgeon: Huel Cote, MD;  Location: [facility];  Service: Orthopedics;  Laterality: Right;   SHOULDER ARTHROSCOPY WITH ROTATOR CUFF REPAIR AND SUBACROMIAL DECOMPRESSION Left 05/21/2016   Procedure: SHOULDER ARTHROSCOPY WITH ROTATOR CUFF REPAIR AND SUBACROMIAL DECOMPRESSION LABRAL DEBRIDEMENT;  Surgeon: Salvatore Marvel, MD;  Location: [facility];  Service: Orthopedics;  Laterality: Left;   VASECTOMY     Patient Active Problem List   Diagnosis Date Noted   Traumatic complete tear of right rotator cuff 11/23/2022   Lab test positive for detection of COVID-19 virus 02/23/2022   Paresthesia 10/23/2018   Traumatic tear of left rotator cuff 05/20/2016   Family history of hypertrophic cardiomyopathy 2014-07-28   Family history of sudden cardiac death in son 07/28/2014    PCP: Farris Has, MD  REFERRING PROVIDER: Huel Cote, MD  REFERRING DIAG: [phone] (ICD-10-CM) - Traumatic complete tear of right rotator cuff, initial  encounter  THERAPY DIAG:  Right shoulder pain, unspecified chronicity  Stiffness of right shoulder, not elsewhere classified  Muscle weakness (generalized)  Rationale for Evaluation and Treatment: Rehabilitation  ONSET DATE: 11/23/22 rotator cuff repair, superior capsular reconstruction, acromioplasty  SUBJECTIVE:                                                                                                                                                                                      SUBJECTIVE STATEMENT: Pt reports minimal pain, more of an ache.    EVAL: In August pt was working on a ladder,  fell backwards but unsure quite how he landed. States he did have a mild concussion but that has since resolved. Previously independent and active for work. Since surgery on 11/23/22, has been receiving assistance from wife and kids for daily tasks. Sleeping in recliner with sling and pillow under arm. Icing 3-4x/day ~74min.  Hand dominance: Right  PERTINENT HISTORY: BIL traumatic RC tears, anxiety, DM, GERD, HTN   PAIN:  Are you having pain: 3-4/10 achy  Location/description: occasional burning and popping; R shoulder, superiorly Best-worst over past week: 0-7/10  - aggravating factors: sleeping and mornings,  - Easing factors: sling, medication    PRECAUTIONS: s/p R RCR 11/23/22; using Dr. Steward Drone rotator cuff repair protocol per op note   WEIGHT BEARING RESTRICTIONS: Yes NWB surgical limb  FALLS:  Has patient fallen in last 6 months? Yes. 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 Lifts and carries up to ~50#  PLOF: Independent  PATIENT  GOALS: get back to playing golf, shooting pool  NEXT MD VISIT: Nov 18th   OBJECTIVE:  Note: Objective measures were completed at Evaluation unless otherwise noted.  DIAGNOSTIC FINDINGS:  S/p shoulder surgery 11/23/22  PATIENT SURVEYS:  FOTO 29>64 predicted  COGNITION: Overall cognitive status: Within functional limits for tasks assessed     SENSATION/OBSERVATION: Surgical site obscured by bandaging Pt denies any sensory complaints since nerve block wore off  POSTURE: Sling RUE, increased R UT elevation  UPPER EXTREMITY ROM:  ROM Right Eval NT Left Eval NT Right 12/01/22  Shoulder flexion   115  Shoulder abduction   80  Shoulder internal rotation     Shoulder external rotation   18  Elbow flexion     Elbow extension     Wrist flexion     Wrist extension      (Blank rows = not tested) (Key: WFL = within functional limits not formally assessed, * = concordant pain, s = stiffness/stretching sensation, NT = not tested)  Comments: deferred given acuity of surgery, phase 1 on eval, elbow flex/ext AAROM grossly WNL  UPPER EXTREMITY MMT:  MMT Right eval Left eval  Shoulder flexion    Shoulder extension    Shoulder abduction    Shoulder extension    Shoulder internal rotation    Shoulder external rotation    Elbow flexion    Elbow extension    Grip strength    (Blank rows = not tested)  (Key: WFL = within functional limits not formally assessed, * = concordant pain, s = stiffness/stretching sensation, NT = not tested)  Comments: deferred given acuity of surgery  SHOULDER SPECIAL TESTS: Deferred given acuity of surgery  JOINT MOBILITY TESTING:  Deferred given acuity of surgery    TODAY'S TREATMENT:  [facility] Adult PT Treatment:                                                DATE: 12/01/22 Therapeutic Exercise: Wrist and elbow  ROM Pendulums  Manual Therapy: PROM with gentle oscillations right shoulder - Anne Ng, DPT   [facility] Adult PT Treatment:                                                DATE: 11/26/22 Therapeutic Exercise: Elbow flex/extension x8, AAROM supported with contralateral UE Wrist flex/extension AROM x8  HEP handout + education, emphasis on safe performance and appropriate setup     PATIENT EDUCATION: Education details: Pt education on PT impairments, prognosis, and POC. Informed consent. Rationale for interventions, safe/appropriate HEP performance Person educated: Patient Education method: Explanation, Demonstration, Tactile cues, Verbal cues, and Handouts Education comprehension: verbalized understanding, returned demonstration, verbal cues required, tactile cues required, and needs further education    HOME EXERCISE PROGRAM: Access Code: [email or web address] URL: [email or web address] Date: 11/26/2022 Prepared by: Fransisco Hertz  Program Notes - with seated elbow flexion, work on gently straightening and bending the elbow with the support of the other arm  Exercises - Seated Elbow Flexion AAROM  - 2-3 x daily - 1 sets - 5-8 reps - Wrist AROM Flexion Extension  - 2-3 x daily - 1 sets - 5-8 reps - Ball Squeeze With Shoulder Sling  - 2-3 x daily - 1 sets - 10 reps  ASSESSMENT:  CLINICAL IMPRESSION: Pt is 8 day post op RCR. First PROM measures taken today by Anne Ng DTP. Continued with PROM, pendulums and review of hand, wrist and elbow HEP. See objective measures section for initial PROM measurements. He declined ice at end of session as he has one at home.    EVAL: Patient is a pleasant 47 y.o. gentleman who was seen today for physical therapy evaluation and treatment for R shoulder arthroscopy with superior capsular reconstruction/debridement and acromioplasty on 11/23/22, per op note pt will follow Dr. Steward Drone rotator cuff repair protocol. Pt endorses limitations in daily  tasks as expected post operatively, arrives in sling on surgical limb. Examination is limited given acuity of surgery with pt in phase 1 per protocol, subsequently deferred formal measurements, plan to assess in phase 2. Today focusing on reinforcing post op precautions, safety, and initiation of elbow/wrist ROM exercises which pt tolerates well. Describes mild transient soreness that resolves with rest. No adverse events. Recommend skilled PT to progress pt through shoulder protocol with aim of maximizing shoulder ROM and strength given heavy physical demands of pt occupation. Pt departs today's session in no acute distress, all voiced questions/concerns addressed appropriately from PT perspective.    OBJECTIVE IMPAIRMENTS: decreased activity tolerance, decreased endurance, decreased mobility, decreased ROM, decreased strength, impaired perceived functional ability, impaired flexibility, impaired UE functional use, postural dysfunction, and pain.   ACTIVITY LIMITATIONS: carrying, lifting, bending, sleeping, transfers, bathing, toileting, dressing, self feeding, reach over head, and hygiene/grooming  PARTICIPATION LIMITATIONS: meal prep, cleaning, laundry, driving, shopping, community activity, occupation, and yard work  PERSONAL FACTORS: Time since onset of injury/illness/exacerbation and 3+ comorbidities: anxiety, DM, HTN  are also affecting patient's functional outcome.   REHAB POTENTIAL: Good  CLINICAL  DECISION MAKING: Stable/uncomplicated  EVALUATION COMPLEXITY: Low   GOALS: Goals reviewed with patient? No  SHORT TERM GOALS: Target date: 01/06/2023 Pt will demonstrate appropriate understanding and performance of initially prescribed HEP in order to facilitate improved independence with management of symptoms.  Baseline: HEP provided on eval Goal status: INITIAL   2. Pt will score greater than or equal to 45 on FOTO in order to demonstrate improved perception of function due to  symptoms.  Baseline: 29  Goal status: INITIAL    LONG TERM GOALS: Target date: 02/17/2023   Pt will score 64 on FOTO in order to demonstrate improved perception of function due to symptoms. Baseline: 29 Goal status: INITIAL  2.  Pt will demonstrate at least 150 degrees of active shoulder elevation in order to demonstrate improved tolerance to functional movement patterns such as overhead reaching. Baseline: deferred on eval given acuity of surgery Goal status: INITIAL  3.  Pt will demonstrate at least 4+/5 shoulder flex/abduction MMT for improved symmetry of UE strength and improved tolerance to functional movements.  Baseline: deferred on eval given acuity of surgery Goal status: INITIAL  4. Pt will report/demonstrate ability to perform upper body dressing with less than 2 point increase in pain on NPS in order to indicate improved tolerance/independence to ADLs.  Baseline: unable to perform, requiring family assist  Goal status: INITIAL   5. Pt will be able to lift at least 40# from floor to waist with less than 2 pt increase in pain in order to facilitate improved tolerance to work tasks.  Baseline: unable  Goal status: INITIAL  PLAN:  PT FREQUENCY: 2x/week  PT DURATION: 12 weeks  PLANNED INTERVENTIONS: 97164- PT Re-evaluation, 97110-Therapeutic exercises, 97530- Therapeutic activity, 97112- Neuromuscular re-education, 97535- Self Care, 27253- Manual therapy, 97014- Electrical stimulation (unattended), 97016- Vasopneumatic device, Patient/Family education, Balance training, Stair training, Taping, Dry Needling, Joint mobilization, Spinal mobilization, Scar mobilization, Cryotherapy, and Moist heat  PLAN FOR NEXT SESSION: review/update HEP PRN. PT to assess PROM with transition to phase 2 (week 1-4 post op per [facility] protocol). True PROM: limited 140 deg forward flexion, 40 ER at side, abduction 60-80 deg max without rotation. Continuing elbow PROM/AROM and gripping  exercises   Jannette Spanner, PTA 12/01/22 11:44 AM Phone: [phone] Fax: [phone]

## 2022-12-06 ENCOUNTER — Ambulatory Visit (INDEPENDENT_AMBULATORY_CARE_PROVIDER_SITE_OTHER): Payer: 59 | Admitting: Orthopaedic Surgery

## 2022-12-06 DIAGNOSIS — S46011A Strain of muscle(s) and tendon(s) of the rotator cuff of right shoulder, initial encounter: Secondary | ICD-10-CM

## 2022-12-06 NOTE — Progress Notes (Signed)
Post Operative Evaluation    Procedure/Date of Surgery: Right shoulder superior capsular reconstruction 11/5  Interval History:   Pain Score:   Presents today 2 weeks status post right shoulder superior capsular reconstruction.  Overall he is doing very well.  He has begun physical therapy.  He has been working on passive range of motion.  He has been compliant with sling usage.   PMH/PSH/Family History/Social History/Meds/Allergies:    Past Medical History:  Diagnosis Date   Anxiety    Diabetes mellitus without complication (HCC)    GERD (gastroesophageal reflux disease)    Hypertension    Obesity (BMI 30-39.9)    Plantar fasciitis of right foot    Skin lesion    Tendonitis    Traumatic tear of left rotator cuff 05/20/2016   Past Surgical History:  Procedure Laterality Date   SHOULDER ACROMIOPLASTY Right 11/23/2022   Procedure: RIGHT SHOULDER ACROMIOPLASTY;  Surgeon: Huel Cote, MD;  Location: [facility];  Service: Orthopedics;  Laterality: Right;   SHOULDER ARTHROSCOPY WITH ROTATOR CUFF REPAIR AND SUBACROMIAL DECOMPRESSION Left 05/21/2016   Procedure: SHOULDER ARTHROSCOPY WITH ROTATOR CUFF REPAIR AND SUBACROMIAL DECOMPRESSION LABRAL DEBRIDEMENT;  Surgeon: Salvatore Marvel, MD;  Location: [facility];  Service: Orthopedics;  Laterality: Left;   VASECTOMY     Social History   Socioeconomic History   Marital status: Married    Spouse name: Not on file   Number of children: 4   Years of education: Not on file   Highest education level: Not on file  Occupational History   Occupation: [employer]  Tobacco Use   Smoking status: Former    Types: Cigarettes   Smokeless tobacco: Former    Types: Snuff  Vaping Use   Vaping status: Never Used  Substance and Sexual Activity   Alcohol use: Yes    Alcohol/week: 0.0 standard drinks of alcohol    Comment: Occas   Drug use: No   Sexual activity: Not on  file  Other Topics Concern   Not on file  Social History Narrative   Not on file   Social Determinants of Health   Financial Resource Strain: Not on file  Food Insecurity: Not on file  Transportation Needs: Not on file  Physical Activity: Not on file  Stress: Not on file  Social Connections: Not on file   Family History  Problem Relation Age of Onset   Hypertrophic cardiomyopathy Son    Diabetes type I Son    No Known Allergies Current Outpatient Medications  Medication Sig Dispense Refill   aspirin EC 325 MG tablet Take 1 tablet (325 mg total) by mouth daily. 14 tablet 0   atorvastatin (LIPITOR) 40 MG tablet Take 40 mg by mouth daily.     JARDIANCE 25 MG TABS tablet Take 25 mg by mouth daily.     lisinopril (ZESTRIL) 10 MG tablet Take 10 mg by mouth daily.     metFORMIN (GLUCOPHAGE) 1000 MG tablet Take 1,000 mg by mouth 2 (two) times daily.     oxyCODONE (ROXICODONE) 5 MG immediate release tablet Take 1 tablet (5 mg total) by mouth every 4 (four) hours as needed for severe pain (pain score 7-10) or breakthrough pain. 15 tablet 0   sertraline (ZOLOFT) 100 MG tablet Take 200 mg by mouth  daily.     Tirzepatide [facility]) Inject into the skin once a week.     No current facility-administered medications for this visit.   No results found.  Review of Systems:   A ROS was performed including pertinent positives and negatives as documented in the HPI.   Musculoskeletal Exam:     Right shoulder is well-appearing without erythema or drainage around surgery sites.  In the spine position he can forward elevate to 90 degrees with external rotation at the side to 30 degrees.  Remainder of distal neurosensory exam is intact  Imaging:      I personally reviewed and interpreted the radiographs.   Assessment:   2-week status post right shoulder superior capsular reconstruction overall doing very well.  At this time he will continue to advance according to the rotator cuff  rehab protocol.  I will plan to see him back in 4 weeks for reassessment  Plan :    -Return to clinic in 4 weeks for reassessment      I personally saw and evaluated the patient, and participated in the management and treatment plan.  Huel Cote, MD Attending Physician, Orthopedic Surgery  This document was dictated using Dragon voice recognition software. A reasonable attempt at proof reading has been made to minimize errors.

## 2022-12-07 ENCOUNTER — Encounter: Payer: Self-pay | Admitting: Physical Therapy

## 2022-12-07 ENCOUNTER — Ambulatory Visit: Payer: 59 | Admitting: Physical Therapy

## 2022-12-07 DIAGNOSIS — M6281 Muscle weakness (generalized): Secondary | ICD-10-CM

## 2022-12-07 DIAGNOSIS — M25511 Pain in right shoulder: Secondary | ICD-10-CM

## 2022-12-07 DIAGNOSIS — M25611 Stiffness of right shoulder, not elsewhere classified: Secondary | ICD-10-CM

## 2022-12-07 NOTE — Therapy (Signed)
OUTPATIENT PHYSICAL THERAPY SHOULDER TREATMENT   Patient Name: [name] [name] MRN: [id number] DOB:10-13-1975, 47 y.o., male Today's Date: 12/07/2022  END OF SESSION:  PT End of Session - 12/07/22 1104     Visit Number 3    Number of Visits 25    Date for PT Re-Evaluation 02/18/23    Authorization Type [employer]    Authorization Time Period 60VL no auth required    Progress Note Due on Visit 10    PT Start Time 1103    PT Stop Time 1144    PT Time Calculation (min) 41 min    Activity Tolerance Patient tolerated treatment well;No increased pain    Behavior During Therapy WFL for tasks assessed/performed              Past Medical History:  Diagnosis Date   Anxiety    Diabetes mellitus without complication (HCC)    GERD (gastroesophageal reflux disease)    Hypertension    Obesity (BMI 30-39.9)    Plantar fasciitis of right foot    Skin lesion    Tendonitis    Traumatic tear of left rotator cuff 05/20/2016   Past Surgical History:  Procedure Laterality Date   SHOULDER ACROMIOPLASTY Right 11/23/2022   Procedure: RIGHT SHOULDER ACROMIOPLASTY;  Surgeon: Huel Cote, MD;  Location: [facility];  Service: Orthopedics;  Laterality: Right;   SHOULDER ARTHROSCOPY WITH ROTATOR CUFF REPAIR AND SUBACROMIAL DECOMPRESSION Left 05/21/2016   Procedure: SHOULDER ARTHROSCOPY WITH ROTATOR CUFF REPAIR AND SUBACROMIAL DECOMPRESSION LABRAL DEBRIDEMENT;  Surgeon: Salvatore Marvel, MD;  Location: [facility];  Service: Orthopedics;  Laterality: Left;   VASECTOMY     Patient Active Problem List   Diagnosis Date Noted   Traumatic complete tear of right rotator cuff 11/23/2022   Lab test positive for detection of COVID-19 virus 02/23/2022   Paresthesia 10/23/2018   Traumatic tear of left rotator cuff 05/20/2016   Family history of hypertrophic cardiomyopathy 18-Aug-2014   Family history of sudden cardiac death in son 08/18/14    PCP: Farris Has, MD  REFERRING  PROVIDER: Huel Cote, MD  REFERRING DIAG: [phone] (ICD-10-CM) - Traumatic complete tear of right rotator cuff, initial encounter  THERAPY DIAG:  Right shoulder pain, unspecified chronicity  Stiffness of right shoulder, not elsewhere classified  Muscle weakness (generalized)  Rationale for Evaluation and Treatment: Rehabilitation  ONSET DATE: 11/23/22 rotator cuff repair, superior capsular reconstruction, acromioplasty  SUBJECTIVE:  SUBJECTIVE STATEMENT: 12/07/2022"I saw the MD yesterday, and he was pleased took the stitches out. Overall doing well."   EVAL: In August pt was working on a ladder, fell backwards but unsure quite how he landed. States he did have a mild concussion but that has since resolved. Previously independent and active for work. Since surgery on 11/23/22, has been receiving assistance from wife and kids for daily tasks. Sleeping in recliner with sling and pillow under arm. Icing 3-4x/day ~55min.  Hand dominance: Right  PERTINENT HISTORY: BIL traumatic RC tears, anxiety, DM, GERD, HTN   PAIN:  Are you having pain: 2-3/10 Location/description: occasional burning and popping; R shoulder, superiorly Best-worst over past week: 0-7/10  - aggravating factors: sleeping and mornings,  - Easing factors: sling, medication    PRECAUTIONS: s/p R RCR 11/23/22; using Dr. Steward Drone rotator cuff repair protocol per op note   WEIGHT BEARING RESTRICTIONS: Yes NWB surgical limb  FALLS:  Has patient fallen in last 6 months? Yes. 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 Lifts and carries up to ~50#  PLOF: Independent  PATIENT GOALS: get back to playing golf, shooting pool  NEXT MD VISIT: Nov 18th   OBJECTIVE:  Note: Objective measures were completed at Evaluation unless otherwise noted.  DIAGNOSTIC FINDINGS:  S/p shoulder surgery 11/23/22  PATIENT SURVEYS:  FOTO 29>64 predicted  COGNITION: Overall cognitive status: Within functional limits for tasks assessed     SENSATION/OBSERVATION: Surgical site obscured by bandaging Pt denies any sensory complaints since nerve block wore off  POSTURE: Sling RUE, increased R UT elevation  UPPER EXTREMITY ROM:  ROM Right Eval NT Left Eval NT Right 12/01/22  Shoulder flexion   115  Shoulder abduction   80  Shoulder internal rotation     Shoulder external rotation   18  Elbow flexion     Elbow extension     Wrist flexion     Wrist extension      (Blank rows = not tested) (Key: WFL = within functional limits not formally assessed, * = concordant pain, s = stiffness/stretching sensation, NT = not tested)  Comments: deferred given acuity of surgery, phase 1 on eval, elbow flex/ext AAROM grossly WNL  UPPER EXTREMITY MMT:  MMT Right eval Left eval  Shoulder flexion    Shoulder extension    Shoulder abduction    Shoulder extension    Shoulder internal rotation    Shoulder external rotation    Elbow flexion    Elbow extension    Grip strength    (Blank rows = not tested)  (Key: WFL = within functional limits not formally assessed, * = concordant pain, s = stiffness/stretching sensation, NT = not tested)  Comments: deferred given acuity of surgery  SHOULDER SPECIAL TESTS: Deferred given acuity of surgery  JOINT MOBILITY TESTING:  Deferred given acuity of surgery    TREATMENT:  [facility] Adult PT Treatment:                                                DATE: 12/07/2022 Therapeutic Exercise: Supine bicep curl 2 x 10 Seated forearm supination/ pronation 2 x 15 with forearm resting on leg Gripping rolled towel 1 x 5 holding 10 seconds with wide grip, 1 x 5 holding 10 seconds with narrow grip Pendulums forward/ backward (demonstration for proper form and technique to avoid AROM) Manual Therapy: Inferior GHJ mobs grade III AP GHJ mobs grade III MTPR along the Bicep bracii and distal triceps x 2 PROM flexion with gentle oscillation working into end of available range gradually   [facility] Adult PT Treatment:                                                DATE: 12/01/22 Therapeutic Exercise: Wrist and elbow ROM Pendulums  Manual Therapy: PROM with gentle oscillations right shoulder - Anne Ng, DPT   [facility] Adult PT Treatment:                                                DATE: 11/26/22 Therapeutic Exercise: Elbow flex/extension x8, AAROM supported with contralateral UE Wrist flex/extension AROM x8  HEP handout + education, emphasis on safe performance and appropriate setup     PATIENT EDUCATION: Education details: Pt education on PT impairments, prognosis, and POC. Informed consent. Rationale for interventions, safe/appropriate HEP performance Person educated: Patient Education method: Explanation, Demonstration, Tactile cues, Verbal cues, and Handouts Education comprehension: verbalized understanding, returned demonstration, verbal cues required, tactile cues required, and needs further education    HOME EXERCISE PROGRAM: Access Code: [email or web address] URL: [email or web address] Date: 12/07/2022 Prepared by: Lulu Riding  Program Notes - with seated elbow flexion, work on gently straightening and bending the elbow with the support of the other arm  Exercises - Seated Elbow Flexion AAROM  - 2-3 x daily - 1 sets -  5-8 reps - Wrist AROM Flexion Extension  - 2-3 x daily - 1 sets - 5-8 reps - Ball Squeeze With Shoulder Sling  - 2-3 x daily - 1 sets - 10 reps - Seated Gripping Towel  - 1 x daily - 7 x weekly - 2 sets - 10 reps - 10 sec  hold - Seated Supination and Pronation Coordination  - 1 x daily - 7 x weekly - 2 sets - 10 reps  ASSESSMENT:  CLINICAL IMPRESSION: 12/07/2022 patient arrives to session reporting he had seen his MD and noted overall he as doing well. Continued working on shoulder PROM per MD protocol. He is week post op today. Continued working elbow AROM and grip strength. Overall he is doing very well and progressing appropriately.    EVAL: Patient is a pleasant 47 y.o. gentleman who was seen today for physical therapy evaluation and treatment for R shoulder arthroscopy with superior capsular reconstruction/debridement and acromioplasty on 11/23/22, per op note pt will follow Dr. Steward Drone rotator cuff repair protocol. Pt endorses limitations in daily tasks as expected post operatively, arrives in sling on surgical limb.  Examination is limited given acuity of surgery with pt in phase 1 per protocol, subsequently deferred formal measurements, plan to assess in phase 2. Today focusing on reinforcing post op precautions, safety, and initiation of elbow/wrist ROM exercises which pt tolerates well. Describes mild transient soreness that resolves with rest. No adverse events. Recommend skilled PT to progress pt through shoulder protocol with aim of maximizing shoulder ROM and strength given heavy physical demands of pt occupation. Pt departs today's session in no acute distress, all voiced questions/concerns addressed appropriately from PT perspective.    OBJECTIVE IMPAIRMENTS: decreased activity tolerance, decreased endurance, decreased mobility, decreased ROM, decreased strength, impaired perceived functional ability, impaired flexibility, impaired UE functional use, postural dysfunction, and pain.    ACTIVITY LIMITATIONS: carrying, lifting, bending, sleeping, transfers, bathing, toileting, dressing, self feeding, reach over head, and hygiene/grooming  PARTICIPATION LIMITATIONS: meal prep, cleaning, laundry, driving, shopping, community activity, occupation, and yard work  PERSONAL FACTORS: Time since onset of injury/illness/exacerbation and 3+ comorbidities: anxiety, DM, HTN  are also affecting patient's functional outcome.   REHAB POTENTIAL: Good  CLINICAL DECISION MAKING: Stable/uncomplicated  EVALUATION COMPLEXITY: Low   GOALS: Goals reviewed with patient? No  SHORT TERM GOALS: Target date: 01/06/2023 Pt will demonstrate appropriate understanding and performance of initially prescribed HEP in order to facilitate improved independence with management of symptoms.  Baseline: HEP provided on eval Goal status: INITIAL   2. Pt will score greater than or equal to 45 on FOTO in order to demonstrate improved perception of function due to symptoms.  Baseline: 29  Goal status: INITIAL    LONG TERM GOALS: Target date: 02/17/2023   Pt will score 64 on FOTO in order to demonstrate improved perception of function due to symptoms. Baseline: 29 Goal status: INITIAL  2.  Pt will demonstrate at least 150 degrees of active shoulder elevation in order to demonstrate improved tolerance to functional movement patterns such as overhead reaching. Baseline: deferred on eval given acuity of surgery Goal status: INITIAL  3.  Pt will demonstrate at least 4+/5 shoulder flex/abduction MMT for improved symmetry of UE strength and improved tolerance to functional movements.  Baseline: deferred on eval given acuity of surgery Goal status: INITIAL  4. Pt will report/demonstrate ability to perform upper body dressing with less than 2 point increase in pain on NPS in order to indicate improved tolerance/independence to ADLs.  Baseline: unable to perform, requiring family assist  Goal status: INITIAL    5. Pt will be able to lift at least 40# from floor to waist with less than 2 pt increase in pain in order to facilitate improved tolerance to work tasks.  Baseline: unable  Goal status: INITIAL  PLAN:  PT FREQUENCY: 2x/week  PT DURATION: 12 weeks  PLANNED INTERVENTIONS: 97164- PT Re-evaluation, 97110-Therapeutic exercises, 97530- Therapeutic activity, 97112- Neuromuscular re-education, 97535- Self Care, 82956- Manual therapy, 97014- Electrical stimulation (unattended), 97016- Vasopneumatic device, Patient/Family education, Balance training, Stair training, Taping, Dry Needling, Joint mobilization, Spinal mobilization, Scar mobilization, Cryotherapy, and Moist heat  PLAN FOR NEXT SESSION: review/update HEP PRN. PT to assess PROM with transition to phase 2 (week 1-4 post op per [facility] protocol). True PROM: limited 140 deg forward flexion, 40 ER at side, abduction 60-80 deg max without rotation. Continuing elbow PROM/AROM and gripping exercises ( See protocol in Media)   Rilley Stash PT, DPT, LAT, ATC  12/07/22  11:48 AM

## 2022-12-09 ENCOUNTER — Ambulatory Visit: Payer: 59 | Admitting: Physical Therapy

## 2022-12-09 ENCOUNTER — Encounter: Payer: Self-pay | Admitting: Physical Therapy

## 2022-12-09 DIAGNOSIS — M25511 Pain in right shoulder: Secondary | ICD-10-CM | POA: Diagnosis not present

## 2022-12-09 DIAGNOSIS — M25611 Stiffness of right shoulder, not elsewhere classified: Secondary | ICD-10-CM

## 2022-12-09 DIAGNOSIS — M6281 Muscle weakness (generalized): Secondary | ICD-10-CM

## 2022-12-09 NOTE — Therapy (Signed)
OUTPATIENT PHYSICAL THERAPY SHOULDER TREATMENT   Patient Name: [name] [name] MRN: [id number] DOB:1976-01-10, 47 y.o., male Today's Date: 12/09/2022  END OF SESSION:  PT End of Session - 12/09/22 1025     Visit Number 4    Number of Visits 25    Date for PT Re-Evaluation 02/18/23    Authorization Type [employer]    Authorization Time Period 60VL no auth required    Progress Note Due on Visit 10    PT Start Time 1024    PT Stop Time 1102    PT Time Calculation (min) 38 min    Activity Tolerance Patient tolerated treatment well;No increased pain    Behavior During Therapy WFL for tasks assessed/performed               Past Medical History:  Diagnosis Date   Anxiety    Diabetes mellitus without complication (HCC)    GERD (gastroesophageal reflux disease)    Hypertension    Obesity (BMI 30-39.9)    Plantar fasciitis of right foot    Skin lesion    Tendonitis    Traumatic tear of left rotator cuff 05/20/2016   Past Surgical History:  Procedure Laterality Date   SHOULDER ACROMIOPLASTY Right 11/23/2022   Procedure: RIGHT SHOULDER ACROMIOPLASTY;  Surgeon: Huel Cote, MD;  Location: [facility];  Service: Orthopedics;  Laterality: Right;   SHOULDER ARTHROSCOPY WITH ROTATOR CUFF REPAIR AND SUBACROMIAL DECOMPRESSION Left 05/21/2016   Procedure: SHOULDER ARTHROSCOPY WITH ROTATOR CUFF REPAIR AND SUBACROMIAL DECOMPRESSION LABRAL DEBRIDEMENT;  Surgeon: Salvatore Marvel, MD;  Location: [facility];  Service: Orthopedics;  Laterality: Left;   VASECTOMY     Patient Active Problem List   Diagnosis Date Noted   Traumatic complete tear of right rotator cuff 11/23/2022   Lab test positive for detection of COVID-19 virus 02/23/2022   Paresthesia 10/23/2018   Traumatic tear of left rotator cuff 05/20/2016   Family history of hypertrophic cardiomyopathy 14-Aug-2014   Family history of sudden cardiac death in son 08/14/14    PCP: Farris Has,  MD  REFERRING PROVIDER: Huel Cote, MD  REFERRING DIAG: [id number] (ICD-10-CM) - Traumatic complete tear of right rotator cuff, initial encounter  THERAPY DIAG:  Right shoulder pain, unspecified chronicity  Stiffness of right shoulder, not elsewhere classified  Muscle weakness (generalized)  Rationale for Evaluation and Treatment: Rehabilitation  ONSET DATE: 11/23/22 rotator cuff repair, superior capsular reconstruction, acromioplasty  SUBJECTIVE:  SUBJECTIVE STATEMENT: 12/09/2022 No pain today.  Not wearing sling. Goes back to MD Dec. 18 th   EVAL: In August pt was working on a ladder, fell backwards but unsure quite how he landed. States he did have a mild concussion but that has since resolved. Previously independent and active for work. Since surgery on 11/23/22, has been receiving assistance from wife and kids for daily tasks. Sleeping in recliner with sling and pillow under arm. Icing 3-4x/day ~35min.  Hand dominance: Right  PERTINENT HISTORY: BIL traumatic RC tears, anxiety, DM, GERD, HTN H/O Lt RCR 2018    PAIN:  Are you having pain: 0/10 today 12/09/22 Location/description: occasional burning and popping; R shoulder, superiorly Best-worst over past week: 0-7/10  - aggravating factors: sleeping and mornings,  - Easing factors: sling, medication    PRECAUTIONS: s/p R RCR 11/23/22; using Dr. Steward Drone rotator cuff repair protocol per op note   WEIGHT BEARING RESTRICTIONS: Yes NWB surgical limb  FALLS:  Has patient fallen in last 6 months? Yes. 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 Lifts and carries up to ~50#  PLOF: Independent  PATIENT GOALS: get back to playing golf, shooting pool  NEXT MD VISIT: Nov 18th   OBJECTIVE:  Note: Objective measures were completed at Evaluation unless otherwise noted.  DIAGNOSTIC FINDINGS:  S/p shoulder surgery 11/23/22  PATIENT SURVEYS:  FOTO 29>64 predicted  COGNITION: Overall cognitive status: Within functional limits for tasks assessed     SENSATION/OBSERVATION: Surgical site obscured by bandaging Pt denies any sensory complaints since nerve block wore off  POSTURE: Sling RUE, increased R UT elevation  UPPER EXTREMITY ROM:  ROM Right Eval NT Left Eval NT Right 12/01/22  Shoulder flexion   115  Shoulder abduction   80  Shoulder internal rotation     Shoulder external rotation   18  Elbow flexion     Elbow extension     Wrist flexion     Wrist extension      (Blank rows = not tested) (Key: WFL = within functional limits not formally assessed, * = concordant pain, s = stiffness/stretching sensation, NT = not tested)  Comments: deferred given acuity of surgery, phase 1 on eval, elbow flex/ext AAROM grossly WNL  UPPER EXTREMITY MMT:  MMT Right eval Left eval  Shoulder flexion    Shoulder extension    Shoulder abduction    Shoulder extension    Shoulder internal rotation    Shoulder external rotation    Elbow flexion    Elbow extension    Grip strength    (Blank rows = not tested)  (Key: WFL = within functional limits not formally assessed, * = concordant pain, s = stiffness/stretching sensation, NT = not tested)  Comments: deferred given acuity of surgery  SHOULDER SPECIAL TESTS: Deferred given acuity of surgery  JOINT MOBILITY TESTING:  Deferred given acuity of surgery    TREATMENT:           [facility] Adult PT Treatment:  DATE: 12/09/22 Manual Therapy: PROM all planes in supine: flexion (110 deg), abduction (80 deg), ER (30) to tolerance Inferior GHJ mobs grade III AP GHJ mobs grade III Gentle long axis distraction Elbow extension, Wrist pronation  Soft tissue to Rt biceps, lateral and ant deltoid, Rt anterior shoulder, pec major, Rt upper trap, levator scap and brief into cervicals   Self Care: Putty, reinforce Passive ROM and donning, doffing sweatshirt                                                                                                                                  [facility] Adult PT Treatment:                                                DATE: 12/07/2022 Therapeutic Exercise: Supine bicep curl 2 x 10 Seated forearm supination/ pronation 2 x 15 with forearm resting on leg Gripping rolled towel 1 x 5 holding 10 seconds with wide grip, 1 x 5 holding 10 seconds with narrow grip Pendulums forward/ backward (demonstration for proper form and technique to avoid AROM) Manual Therapy: Inferior GHJ mobs grade III AP GHJ mobs grade III MTPR along the Bicep bracii and distal triceps x 2 PROM flexion with gentle oscillation working into end of available range gradually   [facility] Adult PT Treatment:                                                DATE: 12/01/22 Therapeutic Exercise: Wrist and elbow ROM Pendulums  Manual Therapy: PROM with gentle oscillations right shoulder - Anne Ng, DPT   [facility] Adult PT Treatment:                                                DATE: 11/26/22 Therapeutic Exercise: Elbow flex/extension x8, AAROM supported with contralateral UE Wrist flex/extension AROM x8  HEP handout + education, emphasis on safe performance and appropriate setup     PATIENT EDUCATION: Education details: Pt education on PT impairments, prognosis, and POC. Informed consent. Rationale for interventions, safe/appropriate HEP performance Person educated: Patient Education method: Explanation,  Demonstration, Tactile cues, Verbal cues, and Handouts Education comprehension: verbalized understanding, returned demonstration, verbal cues required, tactile cues required, and needs further education    HOME EXERCISE PROGRAM: Access Code: [email or web address] URL: [email or web address] Date: 12/07/2022 Prepared by: Lulu Riding  Program Notes - with seated elbow flexion, work on gently straightening and bending the elbow with the support of the other arm  Exercises - Seated Elbow Flexion AAROM  -  2-3 x daily - 1 sets - 5-8 reps - Wrist AROM Flexion Extension  - 2-3 x daily - 1 sets - 5-8 reps - Ball Squeeze With Shoulder Sling  - 2-3 x daily - 1 sets - 10 reps - Seated Gripping Towel  - 1 x daily - 7 x weekly - 2 sets - 10 reps - 10 sec  hold - Seated Supination and Pronation Coordination  - 1 x daily - 7 x weekly - 2 sets - 10 reps  ASSESSMENT:  CLINICAL IMPRESSION: Patient continues to have very little pain at baseline.  He did not wear his sling into the building today because feeling a lot better after has not difficulty with driving.  He truly understands he is not allowed to use his right arm whatsoever.  He demonstrated doffing and donning sweatshirt without cues.  Used soft tissue work today to reduce overall right upper extremity tension and restriction.  He reports feeling a lot better after the session.  He was given putty and will continue to other home program exercises.   EVAL: Patient is a pleasant 47 y.o. gentleman who was seen today for physical therapy evaluation and treatment for R shoulder arthroscopy with superior capsular reconstruction/debridement and acromioplasty on 11/23/22, per op note pt will follow Dr. Steward Drone rotator cuff repair protocol. Pt endorses limitations in daily tasks as expected post operatively, arrives in sling on surgical limb. Examination is limited given acuity of surgery with pt in phase 1 per protocol, subsequently deferred formal  measurements, plan to assess in phase 2. Today focusing on reinforcing post op precautions, safety, and initiation of elbow/wrist ROM exercises which pt tolerates well. Describes mild transient soreness that resolves with rest. No adverse events. Recommend skilled PT to progress pt through shoulder protocol with aim of maximizing shoulder ROM and strength given heavy physical demands of pt occupation. Pt departs today's session in no acute distress, all voiced questions/concerns addressed appropriately from PT perspective.    OBJECTIVE IMPAIRMENTS: decreased activity tolerance, decreased endurance, decreased mobility, decreased ROM, decreased strength, impaired perceived functional ability, impaired flexibility, impaired UE functional use, postural dysfunction, and pain.   ACTIVITY LIMITATIONS: carrying, lifting, bending, sleeping, transfers, bathing, toileting, dressing, self feeding, reach over head, and hygiene/grooming  PARTICIPATION LIMITATIONS: meal prep, cleaning, laundry, driving, shopping, community activity, occupation, and yard work  PERSONAL FACTORS: Time since onset of injury/illness/exacerbation and 3+ comorbidities: anxiety, DM, HTN  are also affecting patient's functional outcome.   REHAB POTENTIAL: Good  CLINICAL DECISION MAKING: Stable/uncomplicated  EVALUATION COMPLEXITY: Low   GOALS: Goals reviewed with patient? No  SHORT TERM GOALS: Target date: 01/06/2023 Pt will demonstrate appropriate understanding and performance of initially prescribed HEP in order to facilitate improved independence with management of symptoms.  Baseline: HEP provided on eval Goal status: Met  2. Pt will score greater than or equal to 45 on FOTO in order to demonstrate improved perception of function due to symptoms.  Baseline: 29  Goal status: INITIAL    LONG TERM GOALS: Target date: 02/17/2023   Pt will score 64 on FOTO in order to demonstrate improved perception of function due to  symptoms. Baseline: 29 Goal status: INITIAL  2.  Pt will demonstrate at least 150 degrees of active shoulder elevation in order to demonstrate improved tolerance to functional movement patterns such as overhead reaching. Baseline: deferred on eval given acuity of surgery Goal status: INITIAL  3.  Pt will demonstrate at least 4+/5 shoulder flex/abduction MMT for improved  symmetry of UE strength and improved tolerance to functional movements.  Baseline: deferred on eval given acuity of surgery Goal status: INITIAL  4. Pt will report/demonstrate ability to perform upper body dressing with less than 2 point increase in pain on NPS in order to indicate improved tolerance/independence to ADLs.  Baseline: unable to perform, requiring family assist  Goal status: INITIAL   5. Pt will be able to lift at least 40# from floor to waist with less than 2 pt increase in pain in order to facilitate improved tolerance to work tasks.  Baseline: unable  Goal status: INITIAL  PLAN:  PT FREQUENCY: 2x/week  PT DURATION: 12 weeks  PLANNED INTERVENTIONS: 97164- PT Re-evaluation, 97110-Therapeutic exercises, 97530- Therapeutic activity, 97112- Neuromuscular re-education, 97535- Self Care, 16109- Manual therapy, 97014- Electrical stimulation (unattended), 97016- Vasopneumatic device, Patient/Family education, Balance training, Stair training, Taping, Dry Needling, Joint mobilization, Spinal mobilization, Scar mobilization, Cryotherapy, and Moist heat  PLAN FOR NEXT SESSION: review/update HEP PRN. PT to assess PROM with transition to phase 2 (week 1-4 post op per [facility] protocol). True PROM: limited 140 deg forward flexion, 40 ER at side, abduction 60-80 deg max without rotation. Continuing elbow PROM/AROM and gripping exercises ( See protocol in Media) Karie Mainland, PT 12/09/22 11:20 AM Phone: [phone] Fax: [phone]

## 2022-12-13 ENCOUNTER — Ambulatory Visit: Payer: 59 | Admitting: Physical Therapy

## 2022-12-13 DIAGNOSIS — M25511 Pain in right shoulder: Secondary | ICD-10-CM | POA: Diagnosis not present

## 2022-12-13 DIAGNOSIS — M25611 Stiffness of right shoulder, not elsewhere classified: Secondary | ICD-10-CM

## 2022-12-13 DIAGNOSIS — M6281 Muscle weakness (generalized): Secondary | ICD-10-CM

## 2022-12-13 NOTE — Therapy (Signed)
OUTPATIENT PHYSICAL THERAPY SHOULDER TREATMENT   Patient Name: [name] [name] MRN: [id number] DOB:Oct 05, 1975, 47 y.o., male Today's Date: 12/13/2022  END OF SESSION:  PT End of Session - 12/13/22 1114     Visit Number 5    Number of Visits 25    Date for PT Re-Evaluation 02/18/23    Authorization Type [employer]    Authorization Time Period 60VL no auth required    Progress Note Due on Visit 10    PT Start Time 1105    PT Stop Time 1144    PT Time Calculation (min) 39 min    Activity Tolerance Patient tolerated treatment well;No increased pain    Behavior During Therapy WFL for tasks assessed/performed               Past Medical History:  Diagnosis Date   Anxiety    Diabetes mellitus without complication (HCC)    GERD (gastroesophageal reflux disease)    Hypertension    Obesity (BMI 30-39.9)    Plantar fasciitis of right foot    Skin lesion    Tendonitis    Traumatic tear of left rotator cuff 05/20/2016   Past Surgical History:  Procedure Laterality Date   SHOULDER ACROMIOPLASTY Right 11/23/2022   Procedure: RIGHT SHOULDER ACROMIOPLASTY;  Surgeon: Huel Cote, MD;  Location: [facility];  Service: Orthopedics;  Laterality: Right;   SHOULDER ARTHROSCOPY WITH ROTATOR CUFF REPAIR AND SUBACROMIAL DECOMPRESSION Left 05/21/2016   Procedure: SHOULDER ARTHROSCOPY WITH ROTATOR CUFF REPAIR AND SUBACROMIAL DECOMPRESSION LABRAL DEBRIDEMENT;  Surgeon: Salvatore Marvel, MD;  Location: [facility];  Service: Orthopedics;  Laterality: Left;   VASECTOMY     Patient Active Problem List   Diagnosis Date Noted   Traumatic complete tear of right rotator cuff 11/23/2022   Lab test positive for detection of COVID-19 virus 02/23/2022   Paresthesia 10/23/2018   Traumatic tear of left rotator cuff 05/20/2016   Family history of hypertrophic cardiomyopathy 08/07/2014   Family history of sudden cardiac death in son 07-Aug-2014    PCP: Farris Has,  MD  REFERRING PROVIDER: Huel Cote, MD  REFERRING DIAG: [phone] (ICD-10-CM) - Traumatic complete tear of right rotator cuff, initial encounter  THERAPY DIAG:  Right shoulder pain, unspecified chronicity  Stiffness of right shoulder, not elsewhere classified  Muscle weakness (generalized)  Rationale for Evaluation and Treatment: Rehabilitation  ONSET DATE: 11/23/22 rotator cuff repair, superior capsular reconstruction, acromioplasty  SUBJECTIVE:  SUBJECTIVE STATEMENT: 12/13/2022 Sore- 2/10 No other complaints.    EVAL: In August pt was working on a ladder, fell backwards but unsure quite how he landed. States he did have a mild concussion but that has since resolved. Previously independent and active for work. Since surgery on 11/23/22, has been receiving assistance from wife and kids for daily tasks. Sleeping in recliner with sling and pillow under arm. Icing 3-4x/day ~59min.  Hand dominance: Right  PERTINENT HISTORY: BIL traumatic RC tears, anxiety, DM, GERD, HTN H/O Lt RCR 2018    PAIN:  Are you having pain: 0/10 today 12/09/22 Location/description: occasional burning and popping; R shoulder, superiorly Best-worst over past week: 0-7/10  - aggravating factors: sleeping and mornings,  - Easing factors: sling, medication    PRECAUTIONS: s/p R RCR 11/23/22; using Dr. Steward Drone rotator cuff repair protocol per op note   WEIGHT BEARING RESTRICTIONS: Yes NWB surgical limb  FALLS:  Has patient fallen in last 6 months? Yes. Number of falls August, precipitating episode, fell off ladder  LIVING ENVIRONMENT: 1 story house 8STE Lives with spouse and 2 children (adults) Housework split between everybody, pt typically does housework Drives  OCCUPATION: Works for [employer] -  does athletic field maintenance; pushing, pulling, backpack blower, bagging leaves, lifting heavy items. Lifts and carries up to ~50#  PLOF: Independent  PATIENT GOALS: get back to playing golf, shooting pool  NEXT MD VISIT: Nov 18th   OBJECTIVE:  Note: Objective measures were completed at Evaluation unless otherwise noted.  DIAGNOSTIC FINDINGS:  S/p shoulder surgery 11/23/22 Panel 1: Right RIGHT SHOULDER ARTHROSCOPY WITH SUPERIOR CAPSULAR RECONSTRUCTION / EXTENSIVE DEBRIDEMENT with Huel Cote, MD  Panel 1: Right RIGHT SHOULDER ACROMIOPLASTY with Huel Cote, MD   PATIENT SURVEYS:  FOTO 29>64 predicted  COGNITION: Overall cognitive status: Within functional limits for tasks assessed     SENSATION/OBSERVATION: Surgical site obscured by bandaging Pt denies any sensory complaints since nerve block wore off  POSTURE: Sling RUE, increased R UT elevation  UPPER EXTREMITY ROM:  ROM Right Eval NT Left Eval NT Right 12/01/22  Shoulder flexion   115  Shoulder abduction   80  Shoulder internal rotation     Shoulder external rotation   18  Elbow flexion     Elbow extension     Wrist flexion     Wrist extension      (Blank rows = not tested) (Key: WFL = within functional limits not formally assessed, * = concordant pain, s = stiffness/stretching sensation, NT = not tested)  Comments: deferred given acuity of surgery, phase 1 on eval, elbow flex/ext AAROM grossly WNL  UPPER EXTREMITY MMT:  MMT Right eval Left eval  Shoulder flexion    Shoulder extension    Shoulder abduction    Shoulder extension    Shoulder internal rotation    Shoulder external rotation    Elbow flexion    Elbow extension    Grip strength    (Blank rows = not tested)  (Key: WFL = within functional limits not formally assessed, * = concordant pain, s = stiffness/stretching sensation, NT = not tested)  Comments: deferred given acuity of surgery  SHOULDER SPECIAL TESTS: Deferred given  acuity of surgery  JOINT MOBILITY TESTING:  Deferred given acuity of surgery    TREATMENT:           [facility] Adult PT Treatment:  DATE: 12/13/22 Therapeutic Exercise: Wrist extension 2 lbs x 20  Wrist flexion 2 lbs x 20  Ulnar deviation/radial 2 lbs x 20  Biceps 2 lbs x 20  Pendulum seated and standing , lateral and forward, back Tricep, bicep red band x 20   Manual Therapy: ROM all planes in supine: flexion (110 deg), abduction (80 deg), ER (30) to tolerance Inferior GHJ mobs grade III AP GHJ mobs grade III Gentle long axis distraction Elbow extension, Wrist pronation  Soft tissue to Rt biceps, lateral and ant deltoid  [facility] Adult PT Treatment:                                                DATE: 12/09/22 Manual Therapy: PROM all planes in supine: flexion (110 deg), abduction (80 deg), ER (30) to tolerance Inferior GHJ mobs grade III AP GHJ mobs grade III Gentle long axis distraction Elbow extension, Wrist pronation  Soft tissue to Rt biceps, lateral and ant deltoid, Rt anterior shoulder, pec major, Rt upper trap, levator scap and brief into cervicals   Self Care: Putty, reinforce Passive ROM and donning, doffing sweatshirt                                                                                                                                  [facility] Adult PT Treatment:                                                DATE: 12/07/2022 Therapeutic Exercise: Supine bicep curl 2 x 10 Seated forearm supination/ pronation 2 x 15 with forearm resting on leg Gripping rolled towel 1 x 5 holding 10 seconds with wide grip, 1 x 5 holding 10 seconds with narrow grip Pendulums forward/ backward (demonstration for proper form and technique to avoid AROM) Manual Therapy: Inferior GHJ mobs grade III AP GHJ mobs grade III MTPR along the Bicep bracii and distal triceps x 2 PROM flexion with gentle oscillation working into end of available  range gradually   [facility] Adult PT Treatment:                                                DATE: 12/01/22 Therapeutic Exercise: Wrist and elbow ROM Pendulums  Manual Therapy: PROM with gentle oscillations right shoulder - Anne Ng, DPT   [facility] Adult PT Treatment:  DATE: 11/26/22 Therapeutic Exercise: Elbow flex/extension x8, AAROM supported with contralateral UE Wrist flex/extension AROM x8  HEP handout + education, emphasis on safe performance and appropriate setup     PATIENT EDUCATION: Education details: Pt education on PT impairments, prognosis, and POC. Informed consent. Rationale for interventions, safe/appropriate HEP performance Person educated: Patient Education method: Explanation, Demonstration, Tactile cues, Verbal cues, and Handouts Education comprehension: verbalized understanding, returned demonstration, verbal cues required, tactile cues required, and needs further education    HOME EXERCISE PROGRAM: Program Notes - with seated elbow flexion, work on gently straightening and bending the elbow with the support of the other arm  Exercises - Seated Elbow Flexion AAROM  - 2-3 x daily - 1 sets - 5-8 reps - Wrist AROM Flexion Extension  - 2-3 x daily - 1 sets - 5-8 reps - Ball Squeeze With Shoulder Sling  - 2-3 x daily - 1 sets - 10 reps - Seated Gripping Towel  - 1 x daily - 7 x weekly - 2 sets - 10 reps - 10 sec  hold - Seated Supination and Pronation Coordination  - 1 x daily - 7 x weekly - 2 sets - 10 reps  - elbow flexion and extension  ASSESSMENT:  CLINICAL IMPRESSION:   Patient able to tolerate Rt elbow and wrist ROM, light bands.  Patient able to get to about 110 deg with some min pain.  He is progressing through the protocol well, able to don and doff sling independently.  Recommend skilled PT to progress pt through shoulder protocol with aim of maximizing shoulder ROM and strength given heavy physical demands of  pt occupation.  OBJECTIVE IMPAIRMENTS: decreased activity tolerance, decreased endurance, decreased mobility, decreased ROM, decreased strength, impaired perceived functional ability, impaired flexibility, impaired UE functional use, postural dysfunction, and pain.   ACTIVITY LIMITATIONS: carrying, lifting, bending, sleeping, transfers, bathing, toileting, dressing, self feeding, reach over head, and hygiene/grooming  PARTICIPATION LIMITATIONS: meal prep, cleaning, laundry, driving, shopping, community activity, occupation, and yard work  PERSONAL FACTORS: Time since onset of injury/illness/exacerbation and 3+ comorbidities: anxiety, DM, HTN  are also affecting patient's functional outcome.   REHAB POTENTIAL: Good  CLINICAL DECISION MAKING: Stable/uncomplicated  EVALUATION COMPLEXITY: Low   GOALS: Goals reviewed with patient? No  SHORT TERM GOALS: Target date: 01/06/2023 Pt will demonstrate appropriate understanding and performance of initially prescribed HEP in order to facilitate improved independence with management of symptoms.  Baseline: HEP provided on eval Goal status: Met  2. Pt will score greater than or equal to 45 on FOTO in order to demonstrate improved perception of function due to symptoms.  Baseline: 29  Goal status: INITIAL    LONG TERM GOALS: Target date: 02/17/2023   Pt will score 64 on FOTO in order to demonstrate improved perception of function due to symptoms. Baseline: 29 Goal status: INITIAL  2.  Pt will demonstrate at least 150 degrees of active shoulder elevation in order to demonstrate improved tolerance to functional movement patterns such as overhead reaching. Baseline: deferred on eval given acuity of surgery Goal status: INITIAL  3.  Pt will demonstrate at least 4+/5 shoulder flex/abduction MMT for improved symmetry of UE strength and improved tolerance to functional movements.  Baseline: deferred on eval given acuity of surgery Goal status:  INITIAL  4. Pt will report/demonstrate ability to perform upper body dressing with less than 2 point increase in pain on NPS in order to indicate improved tolerance/independence to ADLs.  Baseline: unable to perform, requiring  family assist  Goal status: INITIAL   5. Pt will be able to lift at least 40# from floor to waist with less than 2 pt increase in pain in order to facilitate improved tolerance to work tasks.  Baseline: unable  Goal status: INITIAL  PLAN:  PT FREQUENCY: 2x/week  PT DURATION: 12 weeks  PLANNED INTERVENTIONS: 97164- PT Re-evaluation, 97110-Therapeutic exercises, 97530- Therapeutic activity, 97112- Neuromuscular re-education, 97535- Self Care, 84132- Manual therapy, 97014- Electrical stimulation (unattended), 97016- Vasopneumatic device, Patient/Family education, Balance training, Stair training, Taping, Dry Needling, Joint mobilization, Spinal mobilization, Scar mobilization, Cryotherapy, and Moist heat  PLAN FOR NEXT SESSION: review/update HEP PRN. PT to assess PROM with transition to phase 2 (week 1-4 post op per [facility] protocol). True PROM: limited 140 deg forward flexion, 40 ER at side, abduction 60-80 deg max without rotation. Continuing elbow PROM/AROM and gripping exercises ( See protocol in Media)   Karie Mainland, PT 12/13/22 12:00 PM Phone: [phone] Fax: [phone]

## 2022-12-15 ENCOUNTER — Encounter: Payer: Self-pay | Admitting: Physical Therapy

## 2022-12-15 ENCOUNTER — Encounter (HOSPITAL_BASED_OUTPATIENT_CLINIC_OR_DEPARTMENT_OTHER): Payer: 59 | Admitting: Orthopaedic Surgery

## 2022-12-15 ENCOUNTER — Ambulatory Visit: Payer: 59 | Admitting: Physical Therapy

## 2022-12-15 DIAGNOSIS — M25511 Pain in right shoulder: Secondary | ICD-10-CM

## 2022-12-15 DIAGNOSIS — M6281 Muscle weakness (generalized): Secondary | ICD-10-CM

## 2022-12-15 DIAGNOSIS — M25611 Stiffness of right shoulder, not elsewhere classified: Secondary | ICD-10-CM

## 2022-12-15 NOTE — Therapy (Signed)
OUTPATIENT PHYSICAL THERAPY SHOULDER TREATMENT   Patient Name: [name] [name] MRN: [id number] DOB:06/21/1975, 47 y.o., male Today's Date: 12/15/2022  END OF SESSION:  PT End of Session - 12/15/22 0937     Visit Number 6    Number of Visits 25    Date for PT Re-Evaluation 02/18/23    Authorization Type [employer]    Authorization Time Period 60VL no auth required    Progress Note Due on Visit 10    PT Start Time 0934    PT Stop Time 1015    PT Time Calculation (min) 41 min    Activity Tolerance Patient tolerated treatment well;No increased pain    Behavior During Therapy WFL for tasks assessed/performed                Past Medical History:  Diagnosis Date   Anxiety    Diabetes mellitus without complication (HCC)    GERD (gastroesophageal reflux disease)    Hypertension    Obesity (BMI 30-39.9)    Plantar fasciitis of right foot    Skin lesion    Tendonitis    Traumatic tear of left rotator cuff 05/20/2016   Past Surgical History:  Procedure Laterality Date   SHOULDER ACROMIOPLASTY Right 11/23/2022   Procedure: RIGHT SHOULDER ACROMIOPLASTY;  Surgeon: Huel Cote, MD;  Location: [facility];  Service: Orthopedics;  Laterality: Right;   SHOULDER ARTHROSCOPY WITH ROTATOR CUFF REPAIR AND SUBACROMIAL DECOMPRESSION Left 05/21/2016   Procedure: SHOULDER ARTHROSCOPY WITH ROTATOR CUFF REPAIR AND SUBACROMIAL DECOMPRESSION LABRAL DEBRIDEMENT;  Surgeon: Salvatore Marvel, MD;  Location: [facility];  Service: Orthopedics;  Laterality: Left;   VASECTOMY     Patient Active Problem List   Diagnosis Date Noted   Traumatic complete tear of right rotator cuff 11/23/2022   Lab test positive for detection of COVID-19 virus 02/23/2022   Paresthesia 10/23/2018   Traumatic tear of left rotator cuff 05/20/2016   Family history of hypertrophic cardiomyopathy 08-07-2014   Family history of sudden cardiac death in son August 07, 2014    PCP: Farris Has,  MD  REFERRING PROVIDER: Huel Cote, MD  REFERRING DIAG: [phone] (ICD-10-CM) - Traumatic complete tear of right rotator cuff, initial encounter  THERAPY DIAG:  Right shoulder pain, unspecified chronicity  Stiffness of right shoulder, not elsewhere classified  Muscle weakness (generalized)  Rationale for Evaluation and Treatment: Rehabilitation  ONSET DATE: 11/23/22 rotator cuff repair, superior capsular reconstruction, acromioplasty  SUBJECTIVE:  SUBJECTIVE STATEMENT: 12/15/2022 No pain at all. Was a little sore after that.     EVAL: In August pt was working on a ladder, fell backwards but unsure quite how he landed. States he did have a mild concussion but that has since resolved. Previously independent and active for work. Since surgery on 11/23/22, has been receiving assistance from wife and kids for daily tasks. Sleeping in recliner with sling and pillow under arm. Icing 3-4x/day ~40min.  Hand dominance: Right  PERTINENT HISTORY: BIL traumatic RC tears, anxiety, DM, GERD, HTN H/O Lt RCR 2018    PAIN:  Are you having pain: 0/10 today 12/09/22 Location/description: occasional burning and popping; R shoulder, superiorly Best-worst over past week: 0-7/10  - aggravating factors: sleeping and mornings,  - Easing factors: sling, medication    PRECAUTIONS: s/p R RCR 11/23/22; using Dr. Steward Drone rotator cuff repair protocol per op note   WEIGHT BEARING RESTRICTIONS: Yes NWB surgical limb  FALLS:  Has patient fallen in last 6 months? Yes. 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 Lifts and carries up to ~50#  PLOF: Independent  PATIENT GOALS: get back to playing golf, shooting pool  NEXT MD VISIT: Nov 18th   OBJECTIVE:  Note: Objective measures were completed at Evaluation unless otherwise noted.  DIAGNOSTIC FINDINGS:  S/p shoulder surgery 11/23/22 Panel 1: Right RIGHT SHOULDER ARTHROSCOPY WITH SUPERIOR CAPSULAR RECONSTRUCTION / EXTENSIVE DEBRIDEMENT with Huel Cote, MD  Panel 1: Right RIGHT SHOULDER ACROMIOPLASTY with Huel Cote, MD   PATIENT SURVEYS:  FOTO 29>64 predicted  COGNITION: Overall cognitive status: Within functional limits for tasks assessed     SENSATION/OBSERVATION: Surgical site obscured by bandaging Pt denies any sensory complaints since nerve block wore off  POSTURE: Sling RUE, increased R UT elevation  UPPER EXTREMITY ROM:  ROM Right Eval NT Left Eval NT Right 12/01/22  Shoulder flexion   115  Shoulder abduction   80  Shoulder internal rotation     Shoulder external rotation   18  Elbow flexion     Elbow extension     Wrist flexion     Wrist extension      (Blank rows = not tested) (Key: WFL = within functional limits not formally assessed, * = concordant pain, s = stiffness/stretching sensation, NT = not tested)  Comments: deferred given acuity of surgery, phase 1 on eval, elbow flex/ext AAROM grossly WNL  UPPER EXTREMITY MMT:  MMT Right eval Left eval  Shoulder flexion    Shoulder extension    Shoulder abduction    Shoulder extension    Shoulder internal rotation    Shoulder external rotation    Elbow flexion    Elbow extension    Grip strength    (Blank rows = not tested)  (Key: WFL = within functional limits not formally assessed, * = concordant pain, s = stiffness/stretching sensation, NT = not tested)  Comments: deferred given acuity of surgery  SHOULDER SPECIAL  TESTS: Deferred given acuity of surgery  JOINT MOBILITY TESTING:  Deferred given acuity of surgery    TREATMENT:           [facility] Adult PT Treatment:  DATE: 12/15/22 Therapeutic Exercise: Pendulum  Seated triceps red loop Seated bicep 2 lbs 2 x 15  Wrist AROM Flexion and Ext 3 lbs x 2 x 15 Pronation and supination 3lb x20 Manual Therapy: ROM all planes in supine: flexion (110 deg), abduction (80 deg), ER (30) to tolerance Inferior GHJ mobs grade III AP GHJ mobs grade III Gentle long axis distraction  [facility] Adult PT Treatment:                                                DATE: 12/13/22 Therapeutic Exercise: Wrist extension 2 lbs x 20  Wrist flexion 2 lbs x 20  Ulnar deviation/radial 2 lbs x 20  Biceps 2 lbs x 20  Pendulum seated and standing , lateral and forward, back Tricep, bicep red band x 20   Manual Therapy: ROM all planes in supine: flexion (110 deg), abduction (80 deg), ER (30) to tolerance Inferior GHJ mobs grade III AP GHJ mobs grade III Gentle long axis distraction Elbow extension, Wrist pronation  Soft tissue to Rt biceps, lateral and ant deltoid  [facility] Adult PT Treatment:                                                DATE: 12/09/22 Manual Therapy: PROM all planes in supine: flexion (110 deg), abduction (80 deg), ER (30) to tolerance Inferior GHJ mobs grade III AP GHJ mobs grade III Gentle long axis distraction Elbow extension, Wrist pronation  Soft tissue to Rt biceps, lateral and ant deltoid, Rt anterior shoulder, pec major, Rt upper trap, levator scap and brief into cervicals   Self Care: Putty, reinforce Passive ROM and donning, doffing sweatshirt                                                                                                                                  [facility] Adult PT Treatment:                                                DATE: 12/07/2022 Therapeutic Exercise: Supine bicep curl 2 x  10 Seated forearm supination/ pronation 2 x 15 with forearm resting on leg Gripping rolled towel 1 x 5 holding 10 seconds with wide grip, 1 x 5 holding 10 seconds with narrow grip Pendulums forward/ backward (demonstration for proper form and technique to avoid AROM) Manual Therapy: Inferior GHJ mobs grade III AP GHJ mobs grade III MTPR along the Bicep bracii and distal triceps x 2 PROM flexion with gentle oscillation working into  end of available range gradually   [facility] Adult PT Treatment:                                                DATE: 12/01/22 Therapeutic Exercise: Wrist and elbow ROM Pendulums  Manual Therapy: PROM with gentle oscillations right shoulder - Anne Ng, DPT   [facility] Adult PT Treatment:                                                DATE: 11/26/22 Therapeutic Exercise: Elbow flex/extension x8, AAROM supported with contralateral UE Wrist flex/extension AROM x8  HEP handout + education, emphasis on safe performance and appropriate setup     PATIENT EDUCATION: Education details: Pt education on PT impairments, prognosis, and POC. Informed consent. Rationale for interventions, safe/appropriate HEP performance Person educated: Patient Education method: Explanation, Demonstration, Tactile cues, Verbal cues, and Handouts Education comprehension: verbalized understanding, returned demonstration, verbal cues required, tactile cues required, and needs further education    HOME EXERCISE PROGRAM: Program Notes - with seated elbow flexion, work on gently straightening and bending the elbow with the support of the other arm  Exercises - Seated Elbow Flexion AAROM  - 2-3 x daily - 1 sets - 5-8 reps - Wrist AROM Flexion Extension  - 2-3 x daily - 1 sets - 5-8 reps - Ball Squeeze With Shoulder Sling  - 2-3 x daily - 1 sets - 10 reps - Seated Gripping Towel  - 1 x daily - 7 x weekly - 2 sets - 10 reps - 10 sec  hold - Seated Supination and Pronation Coordination  - 1 x  daily - 7 x weekly - 2 sets - 10 reps  - elbow flexion and extension  ASSESSMENT:  CLINICAL IMPRESSION: Patient is within his limit of ROM, tolerating passive range of motion and Rt forearm, wrist exercises.  Cont POC to progress ROM and maintain Rt UE until he achieve the 6 week mark.    OBJECTIVE IMPAIRMENTS: decreased activity tolerance, decreased endurance, decreased mobility, decreased ROM, decreased strength, impaired perceived functional ability, impaired flexibility, impaired UE functional use, postural dysfunction, and pain.   ACTIVITY LIMITATIONS: carrying, lifting, bending, sleeping, transfers, bathing, toileting, dressing, self feeding, reach over head, and hygiene/grooming  PARTICIPATION LIMITATIONS: meal prep, cleaning, laundry, driving, shopping, community activity, occupation, and yard work  PERSONAL FACTORS: Time since onset of injury/illness/exacerbation and 3+ comorbidities: anxiety, DM, HTN  are also affecting patient's functional outcome.   REHAB POTENTIAL: Good  CLINICAL DECISION MAKING: Stable/uncomplicated  EVALUATION COMPLEXITY: Low   GOALS: Goals reviewed with patient? No  SHORT TERM GOALS: Target date: 01/06/2023 Pt will demonstrate appropriate understanding and performance of initially prescribed HEP in order to facilitate improved independence with management of symptoms.  Baseline: HEP provided on eval Goal status: Met  2. Pt will score greater than or equal to 45 on FOTO in order to demonstrate improved perception of function due to symptoms.  Baseline: 29  Goal status: INITIAL    LONG TERM GOALS: Target date: 02/17/2023   Pt will score 64 on FOTO in order to demonstrate improved perception of function due to symptoms. Baseline: 29 Goal status: INITIAL  2.  Pt will demonstrate at least  150 degrees of active shoulder elevation in order to demonstrate improved tolerance to functional movement patterns such as overhead reaching. Baseline: deferred  on eval given acuity of surgery Goal status: INITIAL  3.  Pt will demonstrate at least 4+/5 shoulder flex/abduction MMT for improved symmetry of UE strength and improved tolerance to functional movements.  Baseline: deferred on eval given acuity of surgery Goal status: INITIAL  4. Pt will report/demonstrate ability to perform upper body dressing with less than 2 point increase in pain on NPS in order to indicate improved tolerance/independence to ADLs.  Baseline: unable to perform, requiring family assist  Goal status: INITIAL   5. Pt will be able to lift at least 40# from floor to waist with less than 2 pt increase in pain in order to facilitate improved tolerance to work tasks.  Baseline: unable  Goal status: INITIAL  PLAN:  PT FREQUENCY: 2x/week  PT DURATION: 12 weeks  PLANNED INTERVENTIONS: 97164- PT Re-evaluation, 97110-Therapeutic exercises, 97530- Therapeutic activity, 97112- Neuromuscular re-education, 97535- Self Care, 16109- Manual therapy, 97014- Electrical stimulation (unattended), 97016- Vasopneumatic device, Patient/Family education, Balance training, Stair training, Taping, Dry Needling, Joint mobilization, Spinal mobilization, Scar mobilization, Cryotherapy, and Moist heat  PLAN FOR NEXT SESSION: review/update HEP PRN. PT to assess PROM with transition to phase 2 (week 1-4 post op per [facility] protocol). True PROM: limited 140 deg forward flexion, 40 ER at side, abduction 60-80 deg max without rotation. Continuing elbow PROM/AROM and gripping exercises ( See protocol in Media)   Karie Mainland, PT 12/15/22 9:38 AM Phone: [phone] Fax: [phone]

## 2022-12-21 ENCOUNTER — Encounter: Payer: Self-pay | Admitting: Physical Therapy

## 2022-12-21 ENCOUNTER — Ambulatory Visit: Payer: 59 | Attending: Orthopaedic Surgery | Admitting: Physical Therapy

## 2022-12-21 DIAGNOSIS — M25511 Pain in right shoulder: Secondary | ICD-10-CM | POA: Diagnosis present

## 2022-12-21 DIAGNOSIS — M25611 Stiffness of right shoulder, not elsewhere classified: Secondary | ICD-10-CM | POA: Insufficient documentation

## 2022-12-21 DIAGNOSIS — M6281 Muscle weakness (generalized): Secondary | ICD-10-CM | POA: Insufficient documentation

## 2022-12-21 NOTE — Therapy (Signed)
OUTPATIENT PHYSICAL THERAPY SHOULDER TREATMENT   Patient Name: [name] [name] MRN: [id number] DOB:1976/01/02, 47 y.o., male Today's Date: 12/21/2022  END OF SESSION:  PT End of Session - 12/21/22 1104     Visit Number 7    Number of Visits 25    Date for PT Re-Evaluation 02/18/23    Authorization Type [employer]    Authorization Time Period 60VL no auth required    Progress Note Due on Visit 10    PT Start Time 1057    PT Stop Time 1151    PT Time Calculation (min) 54 min    Activity Tolerance Patient tolerated treatment well;No increased pain    Behavior During Therapy WFL for tasks assessed/performed                 Past Medical History:  Diagnosis Date   Anxiety    Diabetes mellitus without complication (HCC)    GERD (gastroesophageal reflux disease)    Hypertension    Obesity (BMI 30-39.9)    Plantar fasciitis of right foot    Skin lesion    Tendonitis    Traumatic tear of left rotator cuff 05/20/2016   Past Surgical History:  Procedure Laterality Date   SHOULDER ACROMIOPLASTY Right 11/23/2022   Procedure: RIGHT SHOULDER ACROMIOPLASTY;  Surgeon: Huel Cote, MD;  Location: [facility];  Service: Orthopedics;  Laterality: Right;   SHOULDER ARTHROSCOPY WITH ROTATOR CUFF REPAIR AND SUBACROMIAL DECOMPRESSION Left 05/21/2016   Procedure: SHOULDER ARTHROSCOPY WITH ROTATOR CUFF REPAIR AND SUBACROMIAL DECOMPRESSION LABRAL DEBRIDEMENT;  Surgeon: Salvatore Marvel, MD;  Location:  SURGERY CENTER;  Service: Orthopedics;  Laterality: Left;   VASECTOMY     Patient Active Problem List   Diagnosis Date Noted   Traumatic complete tear of right rotator cuff 11/23/2022   Lab test positive for detection of COVID-19 virus 02/23/2022   Paresthesia 10/23/2018   Traumatic tear of left rotator cuff 05/20/2016   Family history of hypertrophic cardiomyopathy 08-20-14   Family history of sudden cardiac death in son 2014-08-20    PCP: Farris Has,  MD  REFERRING PROVIDER: Huel Cote, MD  REFERRING DIAG: [phone] (ICD-10-CM) - Traumatic complete tear of right rotator cuff, initial encounter  THERAPY DIAG:  Right shoulder pain, unspecified chronicity  Stiffness of right shoulder, not elsewhere classified  Muscle weakness (generalized)  Rationale for Evaluation and Treatment: Rehabilitation  ONSET DATE: 11/23/22 rotator cuff repair, superior capsular reconstruction, acromioplasty  SUBJECTIVE:  SUBJECTIVE STATEMENT: 12/21/2022 Today is 4 weeks POST OP. No pain at rest.   EVAL: In August pt was working on a ladder, fell backwards but unsure quite how he landed. States he did have a mild concussion but that has since resolved. Previously independent and active for work. Since surgery on 11/23/22, has been receiving assistance from wife and kids for daily tasks. Sleeping in recliner with sling and pillow under arm. Icing 3-4x/day ~48min.  Hand dominance: Right  PERTINENT HISTORY: BIL traumatic RC tears, anxiety, DM, GERD, HTN H/O Lt RCR 2018    PAIN:  Are you having pain: 0/10 today 12/09/22 Location/description: occasional burning and popping; R shoulder, superiorly Best-worst over past week: 0-7/10  - aggravating factors: sleeping and mornings,  - Easing factors: sling, medication    PRECAUTIONS: s/p R RCR 11/23/22; using Dr. Steward Drone rotator cuff repair protocol per op note   WEIGHT BEARING RESTRICTIONS: Yes NWB surgical limb  FALLS:  Has patient fallen in last 6 months? Yes. 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 Lifts and carries up to ~50#  PLOF: Independent  PATIENT GOALS: get back to playing golf, shooting pool  NEXT MD VISIT: Nov 18th   OBJECTIVE:  Note: Objective measures were completed at Evaluation unless otherwise noted.  DIAGNOSTIC FINDINGS:  S/p shoulder surgery 11/23/22 Panel 1: Right RIGHT SHOULDER ARTHROSCOPY WITH SUPERIOR CAPSULAR RECONSTRUCTION / EXTENSIVE DEBRIDEMENT with Huel Cote, MD  Panel 1: Right RIGHT SHOULDER ACROMIOPLASTY with Huel Cote, MD   PATIENT SURVEYS:  FOTO 29>64 predicted  COGNITION: Overall cognitive status: Within functional limits for tasks assessed     SENSATION/OBSERVATION: Surgical site obscured by bandaging Pt denies any sensory complaints since nerve block wore off  POSTURE: Sling RUE, increased R UT elevation  UPPER EXTREMITY ROM:  ROM Right Eval NT Left Eval NT Right 12/01/22 Rt  12/21/22  Shoulder flexion   115 AAROM 137 deg   Shoulder abduction   80 PROM 100 deg   Shoulder internal rotation    AROM FR to Rt lumbar L3  Shoulder external rotation   18 PROM 45 AROM 40   Elbow flexion      Elbow extension      Wrist flexion      Wrist extension       (Blank rows = not tested) (Key: WFL = within functional limits not formally assessed, * = concordant pain, s = stiffness/stretching sensation, NT = not tested)  Comments: deferred given acuity of surgery, phase 1 on eval, elbow flex/ext AAROM grossly WNL  UPPER EXTREMITY MMT:  MMT Right eval Left eval  Shoulder flexion    Shoulder extension    Shoulder abduction    Shoulder extension    Shoulder internal rotation    Shoulder external rotation    Elbow flexion    Elbow extension    Grip strength    (Blank rows = not tested)  (Key: WFL = within functional limits not formally assessed, * = concordant pain, s = stiffness/stretching sensation, NT = not  tested)  Comments: deferred given acuity of surgery  SHOULDER SPECIAL TESTS: Deferred given acuity of surgery  JOINT MOBILITY TESTING:  Deferred given acuity of surgery  TREATMENT:           [facility] Adult PT Treatment:                                                DATE: 12/21/22 Therapeutic Exercise: AAROM: table slides in flexion, scaption and ER gentle seated beside adjustable mat Pulleys flexion gentle over head 3 min measured Standing abduction and ER with dowel x 10  Supine dowel AAROM : chest press, overhead, ER 2 x 10  Standing ER in doorway  Scapular retraction Biceps red band x 15  Triceps red band x 15   Manual Therapy: ROM all planes in supine: flexion, abduction, external rotation, internal rot. Gentle long axis distraction Modalities: Cold pack 10 min  Self Care: AAROM vs AROM, avoid resistance   [facility] Adult PT Treatment:                                                DATE: 12/15/22 Therapeutic Exercise: Pendulum  Seated triceps red loop Seated bicep 2 lbs 2 x 15  Wrist AROM Flexion and Ext 3 lbs x 2 x 15 Pronation and supination 3lb x20 Manual Therapy: ROM all planes in supine: flexion (110 deg), abduction (80 deg), ER (30) to tolerance Inferior GHJ mobs grade III AP GHJ mobs grade III Gentle long axis distraction  [facility] Adult PT Treatment:                                                DATE: 12/13/22 Therapeutic Exercise: Wrist extension 2 lbs x 20  Wrist flexion 2 lbs x 20  Ulnar deviation/radial 2 lbs x 20  Biceps 2 lbs x 20  Pendulum seated and standing , lateral and forward, back Tricep, bicep red band x 20   Manual Therapy: ROM all planes in supine: flexion (110 deg), abduction (80 deg), ER (30) to tolerance Inferior GHJ mobs grade III AP GHJ mobs grade III Gentle long axis distraction Elbow extension, Wrist pronation  Soft tissue to Rt biceps, lateral and ant deltoid  [facility] Adult PT Treatment:                                                 DATE: 12/09/22 Manual Therapy: PROM all planes in supine: flexion (110 deg), abduction (80 deg), ER (30) to tolerance Inferior GHJ mobs grade III AP GHJ mobs grade III Gentle long axis distraction Elbow extension, Wrist pronation  Soft tissue to Rt biceps, lateral and ant deltoid, Rt anterior shoulder, pec major, Rt upper trap, levator scap and brief into cervicals   Self Care: Putty, reinforce Passive ROM and donning, doffing sweatshirt  [facility] Adult PT Treatment:                                                DATE: 12/07/2022 Therapeutic Exercise: Supine bicep curl 2 x 10 Seated forearm supination/ pronation 2 x 15 with forearm resting on leg Gripping rolled towel 1 x 5 holding 10 seconds with wide grip, 1 x 5 holding 10 seconds with narrow grip Pendulums forward/ backward (demonstration for proper form and technique to avoid AROM) Manual Therapy: Inferior GHJ mobs grade III AP GHJ mobs grade III MTPR along the Bicep bracii and distal triceps x 2 PROM flexion with gentle oscillation working into end of available range gradually   [facility] Adult PT Treatment:                                                DATE: 12/01/22 Therapeutic Exercise: Wrist and elbow ROM Pendulums  Manual Therapy: PROM with gentle oscillations right shoulder - Anne Ng, DPT   [facility] Adult PT Treatment:                                                DATE: 11/26/22 Therapeutic Exercise: Elbow flex/extension x8, AAROM supported with contralateral UE Wrist flex/extension AROM x8  HEP handout + education, emphasis on safe performance and appropriate setup     PATIENT EDUCATION: Education details: Pt education on PT impairments, prognosis, and POC. Informed consent. Rationale for interventions, safe/appropriate HEP performance Person educated: Patient Education method: Explanation,  Demonstration, Tactile cues, Verbal cues, and Handouts Education comprehension: verbalized understanding, returned demonstration, verbal cues required, tactile cues required, and needs further education    HOME EXERCISE PROGRAM: Access Code: [email or web address] URL: [email or web address] Date: 12/21/2022 Prepared by: Karie Mainland  Program Notes - with seated elbow flexion, work on gently straightening and bending the elbow with the support of the other arm  Exercises - Seated Elbow Flexion AAROM  - 2-3 x daily - 1 sets - 5-8 reps - Wrist AROM Flexion Extension  - 2-3 x daily - 1 sets - 5-8 reps - Ball Squeeze With Shoulder Sling  - 2-3 x daily - 1 sets - 10 reps - Seated Gripping Towel  - 1 x daily - 7 x weekly - 2 sets - 10 reps - 10 sec  hold - Seated Supination and Pronation Coordination  - 1 x daily - 7 x weekly - 2 sets - 10 reps - Seated Elbow Extension with Self-Anchored Resistance  - 1 x daily - 7 x weekly - 2 sets - 10 reps - 5 hold - Standing Elbow Flexion with Resistance  - 1 x daily - 7 x weekly - 2 sets - 10 reps - 5 hold - Seated Shoulder Abduction Towel Slide at Table Top  - 1 x daily - 7 x weekly - 2 sets - 10 reps - 10-20 hold - Supine Shoulder Flexion Extension AAROM with Dowel  - 1 x daily - 7 x weekly - 2 sets - 10 reps - 10-20 hold - Standing Shoulder External Rotation Stretch in Doorway  - 1 x  daily - 7 x weekly - 2 sets - 10 reps - 10-20 hold   ASSESSMENT:  CLINICAL IMPRESSION:  Patient progressing well through the protocol and initiated AAROM with min increase in pain . He reports the discomfort of the stretch stops when the motion stops.  See above for AROM measurements.  Proceeding gently, advised to wean off sling in his home. Avoid lifting at all.  Cont POC.    OBJECTIVE IMPAIRMENTS: decreased activity tolerance, decreased endurance, decreased mobility, decreased ROM, decreased strength, impaired perceived functional ability, impaired flexibility,  impaired UE functional use, postural dysfunction, and pain.   ACTIVITY LIMITATIONS: carrying, lifting, bending, sleeping, transfers, bathing, toileting, dressing, self feeding, reach over head, and hygiene/grooming  PARTICIPATION LIMITATIONS: meal prep, cleaning, laundry, driving, shopping, community activity, occupation, and yard work  PERSONAL FACTORS: Time since onset of injury/illness/exacerbation and 3+ comorbidities: anxiety, DM, HTN  are also affecting patient's functional outcome.   REHAB POTENTIAL: Good  CLINICAL DECISION MAKING: Stable/uncomplicated  EVALUATION COMPLEXITY: Low   GOALS: Goals reviewed with patient? No  SHORT TERM GOALS: Target date: 01/06/2023 Pt will demonstrate appropriate understanding and performance of initially prescribed HEP in order to facilitate improved independence with management of symptoms.  Baseline: HEP provided on eval Goal status: Met  2. Pt will score greater than or equal to 45 on FOTO in order to demonstrate improved perception of function due to symptoms.  Baseline: 29  Goal status: INITIAL    LONG TERM GOALS: Target date: 02/17/2023   Pt will score 64 on FOTO in order to demonstrate improved perception of function due to symptoms. Baseline: 29 Goal status: INITIAL  2.  Pt will demonstrate at least 150 degrees of active shoulder elevation in order to demonstrate improved tolerance to functional movement patterns such as overhead reaching. Baseline: deferred on eval given acuity of surgery Goal status: INITIAL  3.  Pt will demonstrate at least 4+/5 shoulder flex/abduction MMT for improved symmetry of UE strength and improved tolerance to functional movements.  Baseline: deferred on eval given acuity of surgery Goal status: INITIAL  4. Pt will report/demonstrate ability to perform upper body dressing with less than 2 point increase in pain on NPS in order to indicate improved tolerance/independence to ADLs.  Baseline: unable to  perform, requiring family assist  Goal status: INITIAL   5. Pt will be able to lift at least 40# from floor to waist with less than 2 pt increase in pain in order to facilitate improved tolerance to work tasks.  Baseline: unable  Goal status: INITIAL  PLAN:  PT FREQUENCY: 2x/week  PT DURATION: 12 weeks  PLANNED INTERVENTIONS: 97164- PT Re-evaluation, 97110-Therapeutic exercises, 97530- Therapeutic activity, 97112- Neuromuscular re-education, 97535- Self Care, 16109- Manual therapy, 97014- Electrical stimulation (unattended), 97016- Vasopneumatic device, Patient/Family education, Balance training, Stair training, Taping, Dry Needling, Joint mobilization, Spinal mobilization, Scar mobilization, Cryotherapy, and Moist heat  PLAN FOR NEXT SESSION: check new HEP  PT to assess PROM with transition to phase 2 (week 1-4 post op per [place] protocol) True PROM: limited 140 deg forward flexion, 40 ER at side, abduction 60-80 deg max without rotation. Continuing elbow PROM/AROM and gripping exercises ( See protocol in Media)   Karie Mainland, PT 12/21/22 11:52 AM Phone: [phone] Fax: [phone]

## 2022-12-22 NOTE — Therapy (Unsigned)
OUTPATIENT PHYSICAL THERAPY SHOULDER TREATMENT   Patient Name: [name] [name] MRN: [id number] DOB:October 31, 1975, 47 y.o., male Today's Date: 12/23/2022  END OF SESSION:  PT End of Session - 12/23/22 1106     Visit Number 8    Number of Visits 25    Date for PT Re-Evaluation 02/18/23    Authorization Type [employer]    Authorization Time Period 60VL no auth required    Progress Note Due on Visit 10    PT Start Time 1102    PT Stop Time 1145    PT Time Calculation (min) 43 min    Activity Tolerance Patient tolerated treatment well    Behavior During Therapy WFL for tasks assessed/performed                  Past Medical History:  Diagnosis Date   Anxiety    Diabetes mellitus without complication (HCC)    GERD (gastroesophageal reflux disease)    Hypertension    Obesity (BMI 30-39.9)    Plantar fasciitis of right foot    Skin lesion    Tendonitis    Traumatic tear of left rotator cuff 05/20/2016   Past Surgical History:  Procedure Laterality Date   SHOULDER ACROMIOPLASTY Right 11/23/2022   Procedure: RIGHT SHOULDER ACROMIOPLASTY;  Surgeon: Huel Cote, MD;  Location: [facility];  Service: Orthopedics;  Laterality: Right;   SHOULDER ARTHROSCOPY WITH ROTATOR CUFF REPAIR AND SUBACROMIAL DECOMPRESSION Left 05/21/2016   Procedure: SHOULDER ARTHROSCOPY WITH ROTATOR CUFF REPAIR AND SUBACROMIAL DECOMPRESSION LABRAL DEBRIDEMENT;  Surgeon: Salvatore Marvel, MD;  Location: [facility];  Service: Orthopedics;  Laterality: Left;   VASECTOMY     Patient Active Problem List   Diagnosis Date Noted   Traumatic complete tear of right rotator cuff 11/23/2022   Lab test positive for detection of COVID-19 virus 02/23/2022   Paresthesia 10/23/2018   Traumatic tear of left rotator cuff 05/20/2016   Family history of hypertrophic cardiomyopathy August 03, 2014   Family history of sudden cardiac death in son 2014-08-03    PCP: Farris Has, MD  REFERRING PROVIDER:  Huel Cote, MD  REFERRING DIAG: [phone] (ICD-10-CM) - Traumatic complete tear of right rotator cuff, initial encounter  THERAPY DIAG:  Right shoulder pain, unspecified chronicity  Stiffness of right shoulder, not elsewhere classified  Muscle weakness (generalized)  Rationale for Evaluation and Treatment: Rehabilitation  ONSET DATE: 11/23/22 rotator cuff repair, superior capsular reconstruction, acromioplasty  SUBJECTIVE:  SUBJECTIVE STATEMENT: 12/5/2024A little sore after last session.  1/10 right now.   EVAL: In August pt was working on a ladder, fell backwards but unsure quite how he landed. States he did have a mild concussion but that has since resolved. Previously independent and active for work. Since surgery on 11/23/22, has been receiving assistance from wife and kids for daily tasks. Sleeping in recliner with sling and pillow under arm. Icing 3-4x/day ~45min.  Hand dominance: Right  PERTINENT HISTORY: BIL traumatic RC tears, anxiety, DM, GERD, HTN H/O Lt RCR 2018    PAIN:  Are you having pain: 0/10 today 12/09/22 Location/description: occasional burning and popping; R shoulder, superiorly Best-worst over past week: 0-7/10  - aggravating factors: sleeping and mornings,  - Easing factors: sling, medication    PRECAUTIONS: s/p R RCR 11/23/22; using Dr. Steward Drone rotator cuff repair protocol per op note   WEIGHT BEARING RESTRICTIONS: Yes NWB surgical limb  FALLS:  Has patient fallen in last 6 months? Yes. 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 Lifts and carries up to ~50#  PLOF: Independent  PATIENT GOALS: get back to playing golf, shooting pool  NEXT MD VISIT: Nov 18th   OBJECTIVE:  Note: Objective measures were completed at Evaluation unless otherwise noted.  DIAGNOSTIC FINDINGS:  S/p shoulder surgery 11/23/22 Panel 1: Right RIGHT SHOULDER ARTHROSCOPY WITH SUPERIOR CAPSULAR RECONSTRUCTION / EXTENSIVE DEBRIDEMENT with Huel Cote, MD  Panel 1: Right RIGHT SHOULDER ACROMIOPLASTY with Huel Cote, MD   PATIENT SURVEYS:  FOTO 29>64 predicted Visit 8 , 12/23/22 : 56%  COGNITION: Overall cognitive status: Within functional limits for tasks assessed     SENSATION/OBSERVATION: Surgical site obscured by bandaging Pt denies any sensory complaints since nerve block wore off  POSTURE: Sling RUE, increased R UT elevation  UPPER EXTREMITY ROM:  ROM Right Eval NT Left Eval NT Right 12/01/22 Rt  12/21/22  Shoulder flexion   115 AAROM 137 deg   Shoulder abduction   80 PROM 100 deg   Shoulder internal rotation    AROM FR to Rt lumbar L3  Shoulder external rotation   18 PROM 45 AROM 40   Elbow flexion      Elbow extension      Wrist flexion      Wrist extension       (Blank rows = not tested) (Key: WFL = within functional limits not formally assessed, * = concordant pain, s = stiffness/stretching sensation, NT = not tested)  Comments: deferred given acuity of surgery, phase 1 on eval, elbow flex/ext AAROM grossly WNL  UPPER EXTREMITY MMT:  MMT Right eval Left eval  Shoulder flexion    Shoulder extension    Shoulder abduction    Shoulder extension    Shoulder internal rotation    Shoulder external rotation    Elbow flexion    Elbow extension    Grip strength    (Blank rows = not tested)  (Key: WFL = within functional limits not formally assessed, * = concordant pain, s = stiffness/stretching sensation, NT  = not tested)  Comments: deferred given acuity of surgery  SHOULDER SPECIAL TESTS: Deferred given acuity of surgery  JOINT MOBILITY TESTING:  Deferred given acuity  of surgery    TREATMENT:            [facility] Adult PT Treatment:                                                DATE: 12/23/22 Therapeutic Exercise: Pulleys flexion, scaption and horizontal abd, 2 min each  UE ranger seated flexion, circles and added leaning forward for increased ROM (no pain at all)  Scapular retraction blue band x 20 Sidelying AROM Rt UE flexion to about 90-100, 2 x 10  Sidelying scaption x 10 Sidelying "row" : AROM x 10  Sidelying ER towel at side x 10 x 2  Supine UE ranger chest press and overhead lift x 15 each  Supine UE ranger cross body reaching x  15   Manual Therapy: ROM all planes in supine: flexion, abduction, external rotation, internal rot. Gentle long axis distraction Modalities: Cold pack 10 min  Self Care: Continues reinforcement about using for ROM but not adding wgt.     [facility] Adult PT Treatment:                                                DATE: 12/21/22 Therapeutic Exercise: AAROM: table slides in flexion, scaption and ER gentle seated beside adjustable mat Pulleys flexion gentle over head 3 min measured Standing abduction and ER with dowel x 10  Supine dowel AAROM : chest press, overhead, ER 2 x 10  Standing ER in doorway  Scapular retraction Biceps red band x 15  Triceps red band x 15   Manual Therapy: ROM all planes in supine: flexion, abduction, external rotation, internal rot. Gentle long axis distraction Modalities: Cold pack 10 min  Self Care: AAROM vs AROM, avoid resistance   [facility] Adult PT Treatment:                                                DATE: 12/15/22 Therapeutic Exercise: Pendulum  Seated triceps red loop Seated bicep 2 lbs 2 x 15  Wrist AROM Flexion and Ext 3 lbs x 2 x 15 Pronation and supination 3lb x20 Manual Therapy: ROM all planes in  supine: flexion (110 deg), abduction (80 deg), ER (30) to tolerance Inferior GHJ mobs grade III AP GHJ mobs grade III Gentle long axis distraction  [facility] Adult PT Treatment:                                                DATE: 12/13/22 Therapeutic Exercise: Wrist extension 2 lbs x 20  Wrist flexion 2 lbs x 20  Ulnar deviation/radial 2 lbs x 20  Biceps 2 lbs x 20  Pendulum seated and standing , lateral and forward, back Tricep, bicep red band x 20   Manual Therapy: ROM all planes in supine: flexion (110 deg), abduction (80 deg), ER (30) to tolerance Inferior GHJ mobs grade III AP GHJ mobs grade III Gentle long axis distraction Elbow extension, Wrist  pronation  Soft tissue to Rt biceps, lateral and ant deltoid  [facility] Adult PT Treatment:                                                DATE: 12/09/22 Manual Therapy: PROM all planes in supine: flexion (110 deg), abduction (80 deg), ER (30) to tolerance Inferior GHJ mobs grade III AP GHJ mobs grade III Gentle long axis distraction Elbow extension, Wrist pronation  Soft tissue to Rt biceps, lateral and ant deltoid, Rt anterior shoulder, pec major, Rt upper trap, levator scap and brief into cervicals   Self Care: Putty, reinforce Passive ROM and donning, doffing sweatshirt                                                                                                                                  [facility] Adult PT Treatment:                                                DATE: 12/07/2022 Therapeutic Exercise: Supine bicep curl 2 x 10 Seated forearm supination/ pronation 2 x 15 with forearm resting on leg Gripping rolled towel 1 x 5 holding 10 seconds with wide grip, 1 x 5 holding 10 seconds with narrow grip Pendulums forward/ backward (demonstration for proper form and technique to avoid AROM) Manual Therapy: Inferior GHJ mobs grade III AP GHJ mobs grade III MTPR along the Bicep bracii and distal triceps x 2 PROM flexion with gentle  oscillation working into end of available range gradually   [facility] Adult PT Treatment:                                                DATE: 12/01/22 Therapeutic Exercise: Wrist and elbow ROM Pendulums  Manual Therapy: PROM with gentle oscillations right shoulder - Anne Ng, DPT   [facility] Adult PT Treatment:                                                DATE: 11/26/22 Therapeutic Exercise: Elbow flex/extension x8, AAROM supported with contralateral UE Wrist flex/extension AROM x8  HEP handout + education, emphasis on safe performance and appropriate setup     PATIENT EDUCATION: Education details: Pt education on PT impairments, prognosis, and POC. Informed consent. Rationale for interventions, safe/appropriate HEP performance Person educated: Patient Education method: Explanation, Demonstration, Tactile cues, Verbal cues, and Handouts Education  comprehension: verbalized understanding, returned demonstration, verbal cues required, tactile cues required, and needs further education    HOME EXERCISE PROGRAM: Access Code: [email or web address] URL: [email or web address] Date: 12/21/2022 Prepared by: Karie Mainland  Program Notes - with seated elbow flexion, work on gently straightening and bending the elbow with the support of the other arm  Exercises - Seated Elbow Flexion AAROM  - 2-3 x daily - 1 sets - 5-8 reps - Wrist AROM Flexion Extension  - 2-3 x daily - 1 sets - 5-8 reps - Ball Squeeze With Shoulder Sling  - 2-3 x daily - 1 sets - 10 reps - Seated Gripping Towel  - 1 x daily - 7 x weekly - 2 sets - 10 reps - 10 sec  hold - Seated Supination and Pronation Coordination  - 1 x daily - 7 x weekly - 2 sets - 10 reps - Seated Elbow Extension with Self-Anchored Resistance  - 1 x daily - 7 x weekly - 2 sets - 10 reps - 5 hold - Standing Elbow Flexion with Resistance  - 1 x daily - 7 x weekly - 2 sets - 10 reps - 5 hold - Seated Shoulder Abduction Towel Slide at Table Top  - 1 x daily  - 7 x weekly - 2 sets - 10 reps - 10-20 hold - Supine Shoulder Flexion Extension AAROM with Dowel  - 1 x daily - 7 x weekly - 2 sets - 10 reps - 10-20 hold - Standing Shoulder External Rotation Stretch in Doorway  - 1 x daily - 7 x weekly - 2 sets - 10 reps - 10-20 hold   ASSESSMENT:  CLINICAL IMPRESSION:  Patient continues to progress through the protocol with only min pain (4/10), resolves with rest. AROM performed today in supine and sidelying with fatigue and min to mod pain . Strength in Rt. UE appears > 3/5 in available range but NT due to protocol.  FOTO score greatly improved but answered as how difficult it would be if the protocol allowed. He will cont to benefit from skilled PT in order to restore full function of Rt UE.   OBJECTIVE IMPAIRMENTS: decreased activity tolerance, decreased endurance, decreased mobility, decreased ROM, decreased strength, impaired perceived functional ability, impaired flexibility, impaired UE functional use, postural dysfunction, and pain.   ACTIVITY LIMITATIONS: carrying, lifting, bending, sleeping, transfers, bathing, toileting, dressing, self feeding, reach over head, and hygiene/grooming  PARTICIPATION LIMITATIONS: meal prep, cleaning, laundry, driving, shopping, community activity, occupation, and yard work  PERSONAL FACTORS: Time since onset of injury/illness/exacerbation and 3+ comorbidities: anxiety, DM, HTN  are also affecting patient's functional outcome.   REHAB POTENTIAL: Good  CLINICAL DECISION MAKING: Stable/uncomplicated  EVALUATION COMPLEXITY: Low   GOALS: Goals reviewed with patient? No  SHORT TERM GOALS: Target date: 01/06/2023 Pt will demonstrate appropriate understanding and performance of initially prescribed HEP in order to facilitate improved independence with management of symptoms.  Baseline: HEP provided on eval Goal status:MET   2. Pt will score greater than or equal to 45 on FOTO in order to demonstrate improved  perception of function due to symptoms.  Baseline: 29  Goal status: MET  LONG TERM GOALS: Target date: 02/17/2023   Pt will score 64 on FOTO in order to demonstrate improved perception of function due to symptoms. Baseline: 29 Goal status: ongoing   2.  Pt will demonstrate at least 150 degrees of active shoulder elevation in order to demonstrate improved tolerance to functional movement patterns such as overhead  reaching. Baseline: deferred on eval given acuity of surgery Goal status: ongoing   3.  Pt will demonstrate at least 4+/5 shoulder flex/abduction MMT for improved symmetry of UE strength and improved tolerance to functional movements.  Baseline: deferred on eval given acuity of surgery Goal status: INITIAL  4. Pt will report/demonstrate ability to perform upper body dressing with less than 2 point increase in pain on NPS in order to indicate improved tolerance/independence to ADLs.  Baseline: unable to perform, requiring family assist  Goal status: INITIAL   5. Pt will be able to lift at least 40# from floor to waist with less than 2 pt increase in pain in order to facilitate improved tolerance to work tasks.  Baseline: unable  Goal status: INITIAL  PLAN:  PT FREQUENCY: 2x/week  PT DURATION: 12 weeks  PLANNED INTERVENTIONS: 97164- PT Re-evaluation, 97110-Therapeutic exercises, 97530- Therapeutic activity, 97112- Neuromuscular re-education, 97535- Self Care, 40981- Manual therapy, 97014- Electrical stimulation (unattended), 97016- Vasopneumatic device, Patient/Family education, Balance training, Stair training, Taping, Dry Needling, Joint mobilization, Spinal mobilization, Scar mobilization, Cryotherapy, and Moist heat  PLAN FOR NEXT SESSION: check new HEP  PT to assess PROM with transition to phase 2 (week 1-4 post op per [place] protocol) True PROM: limited 140 deg forward flexion, 40 ER at side, abduction 60-80 deg max without rotation. Continuing elbow PROM/AROM and  gripping exercises ( See protocol in Media)   Karie Mainland, PT 12/23/22 12:03 PM Phone: [phone] Fax: [phone]

## 2022-12-23 ENCOUNTER — Ambulatory Visit: Payer: 59 | Admitting: Physical Therapy

## 2022-12-23 ENCOUNTER — Encounter: Payer: Self-pay | Admitting: Physical Therapy

## 2022-12-23 DIAGNOSIS — M25611 Stiffness of right shoulder, not elsewhere classified: Secondary | ICD-10-CM

## 2022-12-23 DIAGNOSIS — M25511 Pain in right shoulder: Secondary | ICD-10-CM | POA: Diagnosis not present

## 2022-12-23 DIAGNOSIS — M6281 Muscle weakness (generalized): Secondary | ICD-10-CM

## 2022-12-26 NOTE — Therapy (Unsigned)
OUTPATIENT PHYSICAL THERAPY SHOULDER TREATMENT   Patient Name: [name] [name] MRN: [id number] DOB:05/30/1975, 47 y.o., male Today's Date: 12/27/2022  END OF SESSION:  PT End of Session - 12/27/22 1020     Visit Number 9    Number of Visits 25    Date for PT Re-Evaluation 02/18/23    Authorization Type [employer]    Authorization Time Period 60VL no auth required    Progress Note Due on Visit 10    PT Start Time 1016    PT Stop Time 1100    PT Time Calculation (min) 44 min                   Past Medical History:  Diagnosis Date   Anxiety    Diabetes mellitus without complication (HCC)    GERD (gastroesophageal reflux disease)    Hypertension    Obesity (BMI 30-39.9)    Plantar fasciitis of right foot    Skin lesion    Tendonitis    Traumatic tear of left rotator cuff 05/20/2016   Past Surgical History:  Procedure Laterality Date   SHOULDER ACROMIOPLASTY Right 11/23/2022   Procedure: RIGHT SHOULDER ACROMIOPLASTY;  Surgeon: Huel Cote, MD;  Location: [facility];  Service: Orthopedics;  Laterality: Right;   SHOULDER ARTHROSCOPY WITH ROTATOR CUFF REPAIR AND SUBACROMIAL DECOMPRESSION Left 05/21/2016   Procedure: SHOULDER ARTHROSCOPY WITH ROTATOR CUFF REPAIR AND SUBACROMIAL DECOMPRESSION LABRAL DEBRIDEMENT;  Surgeon: Salvatore Marvel, MD;  Location: [facility];  Service: Orthopedics;  Laterality: Left;   VASECTOMY     Patient Active Problem List   Diagnosis Date Noted   Traumatic complete tear of right rotator cuff 11/23/2022   Lab test positive for detection of COVID-19 virus 02/23/2022   Paresthesia 10/23/2018   Traumatic tear of left rotator cuff 05/20/2016   Family history of hypertrophic cardiomyopathy 08/03/2014   Family history of sudden cardiac death in son 08/03/14    PCP: Farris Has, MD  REFERRING PROVIDER: Huel Cote, MD  REFERRING DIAG: [phone] (ICD-10-CM) - Traumatic complete tear of right rotator cuff,  initial encounter  THERAPY DIAG:  Right shoulder pain, unspecified chronicity  Stiffness of right shoulder, not elsewhere classified  Muscle weakness (generalized)  Rationale for Evaluation and Treatment: Rehabilitation  ONSET DATE: 11/23/22 rotator cuff repair, superior capsular reconstruction, acromioplasty  SUBJECTIVE:                                                                                                                                                                                      SUBJECTIVE STATEMENT: 12/9/2024A little sore after last session.  1/10 right now.   EVAL: In  August pt was working on a ladder, fell backwards but unsure quite how he landed. States he did have a mild concussion but that has since resolved. Previously independent and active for work. Since surgery on 11/23/22, has been receiving assistance from wife and kids for daily tasks. Sleeping in recliner with sling and pillow under arm. Icing 3-4x/day ~66min.  Hand dominance: Right  PERTINENT HISTORY: BIL traumatic RC tears, anxiety, DM, GERD, HTN H/O Lt RCR 2018    PAIN:  Are you having pain: 0/10 today 12/09/22 Location/description: occasional burning and popping; R shoulder, superiorly Best-worst over past week: 0-7/10  - aggravating factors: sleeping and mornings,  - Easing factors: sling, medication    PRECAUTIONS: s/p R RCR 11/23/22; using Dr. Steward Drone rotator cuff repair protocol per op note   WEIGHT BEARING RESTRICTIONS: Yes NWB surgical limb  FALLS:  Has patient fallen in last 6 months? Yes. 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 Lifts and  carries up to ~50#  PLOF: Independent  PATIENT GOALS: get back to playing golf, shooting pool  NEXT MD VISIT: Nov 18th   OBJECTIVE:  Note: Objective measures were completed at Evaluation unless otherwise noted.  DIAGNOSTIC FINDINGS:  S/p shoulder surgery 11/23/22 Panel 1: Right RIGHT SHOULDER ARTHROSCOPY WITH SUPERIOR CAPSULAR RECONSTRUCTION / EXTENSIVE DEBRIDEMENT with Huel Cote, MD  Panel 1: Right RIGHT SHOULDER ACROMIOPLASTY with Huel Cote, MD   PATIENT SURVEYS:  FOTO 29>64 predicted Visit 8 , 12/23/22 : 56%  COGNITION: Overall cognitive status: Within functional limits for tasks assessed     SENSATION/OBSERVATION: Surgical site obscured by bandaging Pt denies any sensory complaints since nerve block wore off  POSTURE: Sling RUE, increased R UT elevation  UPPER EXTREMITY ROM:  ROM Right Eval NT Left Eval NT Right 12/01/22 Rt  12/21/22  Shoulder flexion   115 AAROM 137 deg   Shoulder abduction   80 PROM 100 deg   Shoulder internal rotation    AROM FR to Rt lumbar L3  Shoulder external rotation   18 PROM 45 AROM 40   Elbow flexion      Elbow extension      Wrist flexion      Wrist extension       (Blank rows = not tested) (Key: WFL = within functional limits not formally assessed, * = concordant pain, s = stiffness/stretching sensation, NT = not tested)  Comments: deferred given acuity of surgery, phase 1 on eval, elbow flex/ext AAROM grossly WNL  UPPER EXTREMITY MMT:  MMT Right eval R 12/27/22   Shoulder flexion    Shoulder extension    Shoulder abduction    Shoulder extension    Shoulder internal rotation  5  Shoulder external rotation  4-  Elbow flexion    Elbow extension    Grip strength    (Blank rows = not tested)  (Key: WFL = within functional limits not formally assessed, * = concordant pain, s = stiffness/stretching sensation, NT = not tested)  Comments: deferred given acuity of surgery  SHOULDER SPECIAL TESTS: Deferred given  acuity of surgery  JOINT MOBILITY TESTING:  Deferred given acuity of surgery    TREATMENT:  [facility] Adult PT Treatment:                                                DATE: 12/27/22 Therapeutic Exercise: Pulleys flexion, scaption, IR  Supine AAROM overhead , chest press, cross body and ER  Wall slides with towel flexion Wall ladder  Scapular row green band x 15  Standing AAROM overhead, chest press and ER  x 10  Ball press standing (ball on mat) double arm Shoulder ROM with ball in standing Seated ER/IR with scapular setting Table ball rolls 1 min each in scaption Seated ROM ER at table  Manual Therapy: PROM all planes to tolerance, limiting combined abduction and external rotation Measure ROM  Seated manual to Rt UE inferior glide Pressure point release to teres minor   [facility] Adult PT Treatment:                                                DATE: 12/23/22 Therapeutic Exercise: Pulleys flexion, scaption and horizontal abd, 2 min each  UE ranger seated flexion, circles and added leaning forward for increased ROM (no pain at all)  Scapular retraction blue band x 20 Sidelying AROM Rt UE flexion to about 90-100, 2 x 10  Sidelying scaption x 10 Sidelying "row" : AROM x 10  Sidelying ER towel at side x 10 x 2  Supine UE ranger chest press and overhead lift x 15 each  Supine UE ranger cross body reaching x  15   Manual Therapy: ROM all planes in supine: flexion, abduction, external rotation, internal rot. Gentle long axis distraction Modalities: Cold pack 10 min  Self Care: Continues reinforcement about using for ROM but not adding wgt.     [facility] Adult PT Treatment:                                                DATE: 12/21/22 Therapeutic Exercise: AAROM: table slides in flexion, scaption and ER gentle seated beside adjustable mat Pulleys flexion gentle over head 3 min measured Standing abduction and ER with dowel x 10  Supine dowel AAROM : chest press, overhead, ER 2  x 10  Standing ER in doorway  Scapular retraction Biceps red band x 15  Triceps red band x 15   Manual Therapy: ROM all planes in supine: flexion, abduction, external rotation, internal rot. Gentle long axis distraction Modalities: Cold pack 10 min  Self Care: AAROM vs AROM, avoid resistance   [facility] Adult PT Treatment:                                                DATE: 12/15/22 Therapeutic Exercise: Pendulum  Seated triceps red loop Seated bicep 2 lbs 2 x 15  Wrist AROM Flexion and Ext 3 lbs x 2 x 15 Pronation and supination 3lb x20 Manual Therapy: ROM all planes in supine: flexion (110 deg), abduction (80 deg), ER (30) to tolerance Inferior GHJ mobs grade  III AP GHJ mobs grade III Gentle long axis distraction     PATIENT EDUCATION: Education details: Pt education on PT impairments, prognosis, and POC. Informed consent. Rationale for interventions, safe/appropriate HEP performance Person educated: Patient Education method: Explanation, Demonstration, Tactile cues, Verbal cues, and Handouts Education comprehension: verbalized understanding, returned demonstration, verbal cues required, tactile cues required, and needs further education    HOME EXERCISE PROGRAM: Access Code: [email or web address] URL: [email or web address] Date: 12/21/2022 Prepared by: Karie Mainland  Program Notes - with seated elbow flexion, work on gently straightening and bending the elbow with the support of the other arm  Exercises - Seated Elbow Flexion AAROM  - 2-3 x daily - 1 sets - 5-8 reps - Wrist AROM Flexion Extension  - 2-3 x daily - 1 sets - 5-8 reps - Ball Squeeze With Shoulder Sling  - 2-3 x daily - 1 sets - 10 reps - Seated Gripping Towel  - 1 x daily - 7 x weekly - 2 sets - 10 reps - 10 sec  hold - Seated Supination and Pronation Coordination  - 1 x daily - 7 x weekly - 2 sets - 10 reps - Seated Elbow Extension with Self-Anchored Resistance  - 1 x daily - 7 x weekly - 2 sets - 10  reps - 5 hold - Standing Elbow Flexion with Resistance  - 1 x daily - 7 x weekly - 2 sets - 10 reps - 5 hold - Seated Shoulder Abduction Towel Slide at Table Top  - 1 x daily - 7 x weekly - 2 sets - 10 reps - 10-20 hold - Supine Shoulder Flexion Extension AAROM with Dowel  - 1 x daily - 7 x weekly - 2 sets - 10 reps - 10-20 hold - Standing Shoulder External Rotation Stretch in Doorway  - 1 x daily - 7 x weekly - 2 sets - 10 reps - 10-20 hold   ASSESSMENT:  CLINICAL IMPRESSION:  Patient continues to progress through his protocol.  Tightness and pain with end range active assisted range of motion see below for measurements. AROM flexion 145 supine , ER 32 at sl abd AROM IR 60 deg + Patient will continue to benefit from skilled physical therapy as he recovers from this surgery.   OBJECTIVE IMPAIRMENTS: decreased activity tolerance, decreased endurance, decreased mobility, decreased ROM, decreased strength, impaired perceived functional ability, impaired flexibility, impaired UE functional use, postural dysfunction, and pain.   ACTIVITY LIMITATIONS: carrying, lifting, bending, sleeping, transfers, bathing, toileting, dressing, self feeding, reach over head, and hygiene/grooming  PARTICIPATION LIMITATIONS: meal prep, cleaning, laundry, driving, shopping, community activity, occupation, and yard work  PERSONAL FACTORS: Time since onset of injury/illness/exacerbation and 3+ comorbidities: anxiety, DM, HTN  are also affecting patient's functional outcome.   REHAB POTENTIAL: Good  CLINICAL DECISION MAKING: Stable/uncomplicated  EVALUATION COMPLEXITY: Low   GOALS: Goals reviewed with patient? No  SHORT TERM GOALS: Target date: 01/06/2023 Pt will demonstrate appropriate understanding and performance of initially prescribed HEP in order to facilitate improved independence with management of symptoms.  Baseline: HEP provided on eval Goal status:MET   2. Pt will score greater than or equal to  45 on FOTO in order to demonstrate improved perception of function due to symptoms.  Baseline: 29  Goal status: MET  LONG TERM GOALS: Target date: 02/17/2023   Pt will score 64 on FOTO in order to demonstrate improved perception of function due to symptoms. Baseline: 29 Goal status: ongoing   2.  Pt will  demonstrate at least 150 degrees of active shoulder elevation in order to demonstrate improved tolerance to functional movement patterns such as overhead reaching. Baseline: deferred on eval given acuity of surgery Goal status: ongoing   3.  Pt will demonstrate at least 4+/5 shoulder flex/abduction MMT for improved symmetry of UE strength and improved tolerance to functional movements.  Baseline: deferred on eval given acuity of surgery Goal status: INITIAL  4. Pt will report/demonstrate ability to perform upper body dressing with less than 2 point increase in pain on NPS in order to indicate improved tolerance/independence to ADLs.  Baseline: unable to perform, requiring family assist  Goal status: INITIAL   5. Pt will be able to lift at least 40# from floor to waist with less than 2 pt increase in pain in order to facilitate improved tolerance to work tasks.  Baseline: unable  Goal status: INITIAL  PLAN:  PT FREQUENCY: 2x/week  PT DURATION: 12 weeks  PLANNED INTERVENTIONS: 97164- PT Re-evaluation, 97110-Therapeutic exercises, 97530- Therapeutic activity, 97112- Neuromuscular re-education, 97535- Self Care, 65784- Manual therapy, 97014- Electrical stimulation (unattended), 97016- Vasopneumatic device, Patient/Family education, Balance training, Stair training, Taping, Dry Needling, Joint mobilization, Spinal mobilization, Scar mobilization, Cryotherapy, and Moist heat  PLAN FOR NEXT SESSION: check new HEP .  Progress AAROM to AROM.  Manual therapy, scapular retraction, posture  Karie Mainland, PT 12/27/22 11:16 AM Phone: [phone] Fax: [phone]

## 2022-12-27 ENCOUNTER — Encounter: Payer: Self-pay | Admitting: Physical Therapy

## 2022-12-27 ENCOUNTER — Ambulatory Visit: Payer: 59 | Admitting: Physical Therapy

## 2022-12-27 DIAGNOSIS — M25511 Pain in right shoulder: Secondary | ICD-10-CM | POA: Diagnosis not present

## 2022-12-27 DIAGNOSIS — M25611 Stiffness of right shoulder, not elsewhere classified: Secondary | ICD-10-CM

## 2022-12-27 DIAGNOSIS — M6281 Muscle weakness (generalized): Secondary | ICD-10-CM

## 2022-12-30 NOTE — Therapy (Signed)
OUTPATIENT PHYSICAL THERAPY SHOULDER TREATMENT   Patient Name: [name] [name] MRN: [id number] DOB:[birth year], 47 y.o., male Today's Date: 12/31/2022  END OF SESSION:  PT End of Session - 12/31/22 1054     Visit Number 10    Number of Visits 25    Date for PT Re-Evaluation 02/18/23    Authorization Type U[employer]    Authorization Time Period 60VL no auth required    Progress Note Due on Visit 10    PT Start Time 1100    PT Stop Time 1145    PT Time Calculation (min) 45 min    Activity Tolerance Patient tolerated treatment well    Behavior During Therapy WFL for tasks assessed/performed                    Past Medical History:  Diagnosis Date   Anxiety    Diabetes mellitus without complication (HCC)    GERD (gastroesophageal reflux disease)    Hypertension    Obesity (BMI 30-39.9)    Plantar fasciitis of right foot    Skin lesion    Tendonitis    Traumatic tear of left rotator cuff 05/20/2016   Past Surgical History:  Procedure Laterality Date   SHOULDER ACROMIOPLASTY Right 11/23/2022   Procedure: RIGHT SHOULDER ACROMIOPLASTY;  Surgeon: Huel Cote, MD;  Location: [facility];  Service: Orthopedics;  Laterality: Right;   SHOULDER ARTHROSCOPY WITH ROTATOR CUFF REPAIR AND SUBACROMIAL DECOMPRESSION Left 05/21/2016   Procedure: SHOULDER ARTHROSCOPY WITH ROTATOR CUFF REPAIR AND SUBACROMIAL DECOMPRESSION LABRAL DEBRIDEMENT;  Surgeon: Salvatore Marvel, MD;  Location: [facility];  Service: Orthopedics;  Laterality: Left;   VASECTOMY     Patient Active Problem List   Diagnosis Date Noted   Traumatic complete tear of right rotator cuff 11/23/2022   Lab test positive for detection of COVID-19 virus 02/23/2022   Paresthesia 10/23/2018   Traumatic tear of left rotator cuff 05/20/2016   Family history of hypertrophic cardiomyopathy 08/18/14   Family history of sudden cardiac death in son 18-Aug-2014    PCP: Farris Has, MD  REFERRING  PROVIDER: Huel Cote, MD  REFERRING DIAG: [phone] (ICD-10-CM) - Traumatic complete tear of right rotator cuff, initial encounter  THERAPY DIAG:  Right shoulder pain, unspecified chronicity  Stiffness of right shoulder, not elsewhere classified  Muscle weakness (generalized)  Rationale for Evaluation and Treatment: Rehabilitation  ONSET DATE: 11/23/22 rotator cuff repair, superior capsular reconstruction, acromioplasty  SUBJECTIVE:  SUBJECTIVE STATEMENT: Pt doing well today.  He has been doing his HEP 2 x per day.  Rt triceps burning but not today/sensitive.  Sore.  Can't really sleep as well in the bed.   EVAL: In August pt was working on a ladder, fell backwards but unsure quite how he landed. States he did have a mild concussion but that has since resolved. Previously independent and active for work. Since surgery on 11/23/22, has been receiving assistance from wife and kids for daily tasks. Sleeping in recliner with sling and pillow under arm. Icing 3-4x/day ~26min.  Hand dominance: Right  PERTINENT HISTORY: BIL traumatic RC tears, anxiety, DM, GERD, HTN H/O Lt RCR 2018    PAIN:  Are you having pain: 0/10 today 12/09/22 Location/description: occasional burning and popping; R shoulder, superiorly Best-worst over past week: 0-7/10  - aggravating factors: sleeping and mornings,  - Easing factors: sling, medication    PRECAUTIONS: s/p R RCR 11/23/22; using Dr. Steward Drone rotator cuff repair protocol per op note   WEIGHT BEARING RESTRICTIONS: Yes NWB surgical limb  FALLS:  Has patient fallen in last 6 months? Yes. Number of falls August, precipitating episode, fell off ladder  LIVING ENVIRONMENT: 1 story house 8STE Lives with spouse and 2 children (adults) Housework split between everybody, pt  typically does housework Drives  OCCUPATION: Works for [employer], [employer] - does athletic field maintenance; pushing, pulling, backpack blower, bagging leaves, lifting heavy items. Lifts and carries up to ~50#  PLOF: Independent  PATIENT GOALS: get back to playing golf, shooting pool  NEXT MD VISIT: Nov 18th   OBJECTIVE:  Note: Objective measures were completed at Evaluation unless otherwise noted.  DIAGNOSTIC FINDINGS:  S/p shoulder surgery 11/23/22 Panel 1: Right RIGHT SHOULDER ARTHROSCOPY WITH SUPERIOR CAPSULAR RECONSTRUCTION / EXTENSIVE DEBRIDEMENT with Huel Cote, MD  Panel 1: Right RIGHT SHOULDER ACROMIOPLASTY with Huel Cote, MD   PATIENT SURVEYS:  FOTO 29>64 predicted Visit 8 , 12/23/22 : 56%  COGNITION: Overall cognitive status: Within functional limits for tasks assessed     SENSATION/OBSERVATION: Surgical site obscured by bandaging Pt denies any sensory complaints since nerve block wore off  POSTURE: Sling RUE, increased R UT elevation  UPPER EXTREMITY ROM:  ROM Right Eval NT Right 12/01/22 Rt  12/21/22  Shoulder flexion  115 AAROM 137 deg   Shoulder abduction  80 PROM 100 deg   Shoulder internal rotation   AROM FR to Rt lumbar L3  Shoulder external rotation  18 PROM 45 AROM 40   Elbow flexion     Elbow extension     Wrist flexion     Wrist extension      (Blank rows = not tested) (Key: WFL = within functional limits not formally assessed, * = concordant pain, s = stiffness/stretching sensation, NT = not tested)  Comments: deferred given acuity of surgery, phase 1 on eval, elbow flex/ext AAROM grossly WNL  UPPER EXTREMITY MMT:  MMT Right eval R 12/27/22   Shoulder flexion    Shoulder extension    Shoulder abduction    Shoulder extension    Shoulder internal rotation  5  Shoulder external rotation  4-  Elbow flexion    Elbow extension    Grip strength    (Blank rows = not tested)  (Key: WFL = within functional limits  not formally assessed, * = concordant pain, s = stiffness/stretching sensation, NT = not tested)  Comments: deferred given acuity of surgery  SHOULDER SPECIAL TESTS:  Deferred given acuity of surgery  JOINT MOBILITY TESTING:  Deferred given acuity of surgery    TREATMENT:           [facility] Adult PT Treatment:                                                DATE: 12/31/22 Therapeutic Exercise: Supine AAROM with dowel flexion, add/abd ER and chest press  Standing AAROM extension , IR 2  x 10  Seated AAROM abduction, flexion, ER  Standing closed chain shoulder taps , reaches  Lateral high plank walk Wall circle sweeps  Pulleys overhead 140 deg AAROM and scaption  Manual Therapy: Seated inferior glide for GH motion abduction  Teres minor and subscapularis , upper trap pressure point   [facility] Adult PT Treatment:                                                DATE: 12/27/22 Therapeutic Exercise: Pulleys flexion, scaption, IR  Supine AAROM overhead , chest press, cross body and ER  Wall slides with towel flexion Wall ladder  Scapular row green band x 15  Standing AAROM overhead, chest press and ER  x 10  Ball press standing (ball on mat) double arm Shoulder ROM with ball in standing Seated ER/IR with scapular setting Table ball rolls 1 min each in scaption Seated ROM ER at table  Manual Therapy: PROM all planes to tolerance, limiting combined abduction and external rotation Measure ROM  Seated manual to Rt UE inferior glide Pressure point release to teres minor   [facility] Adult PT Treatment:                                                DATE: 12/23/22 Therapeutic Exercise: Pulleys flexion, scaption and horizontal abd, 2 min each  UE ranger seated flexion, circles and added leaning forward for increased ROM (no pain at all)  Scapular retraction blue band x 20 Sidelying AROM Rt UE flexion to about 90-100, 2 x 10  Sidelying scaption x 10 Sidelying "row" : AROM x 10  Sidelying ER towel  at side x 10 x 2  Supine UE ranger chest press and overhead lift x 15 each  Supine UE ranger cross body reaching x  15   Manual Therapy: ROM all planes in supine: flexion, abduction, external rotation, internal rot. Gentle long axis distraction Modalities: Cold pack 10 min  Self Care: Continues reinforcement about using for ROM but not adding wgt.     [facility] Adult PT Treatment:                                                DATE: 12/21/22 Therapeutic Exercise: AAROM: table slides in flexion, scaption and ER gentle seated beside adjustable mat Pulleys flexion gentle over head 3 min measured Standing abduction and ER with dowel x 10  Supine dowel AAROM : chest press, overhead, ER 2 x 10  Standing ER  in doorway  Scapular retraction Biceps red band x 15  Triceps red band x 15   Manual Therapy: ROM all planes in supine: flexion, abduction, external rotation, internal rot. Gentle long axis distraction Modalities: Cold pack 10 min  Self Care: AAROM vs AROM, avoid resistance   [facility] Adult PT Treatment:                                                DATE: 12/15/22 Therapeutic Exercise: Pendulum  Seated triceps red loop Seated bicep 2 lbs 2 x 15  Wrist AROM Flexion and Ext 3 lbs x 2 x 15 Pronation and supination 3lb x20 Manual Therapy: ROM all planes in supine: flexion (110 deg), abduction (80 deg), ER (30) to tolerance Inferior GHJ mobs grade III AP GHJ mobs grade III Gentle long axis distraction     PATIENT EDUCATION: Education details: Pt education on PT impairments, prognosis, and POC. Informed consent. Rationale for interventions, safe/appropriate HEP performance Person educated: Patient Education method: Explanation, Demonstration, Tactile cues, Verbal cues, and Handouts Education comprehension: verbalized understanding, returned demonstration, verbal cues required, tactile cues required, and needs further education    HOME EXERCISE PROGRAM: Access Code: [email or web address] URL:  [email or web address] Date: 12/21/2022 Prepared by: Karie Mainland    ASSESSMENT:  CLINICAL IMPRESSION:  Patient is overall progressing through his protocol, able to achieve 140 deg with AAROM.  Increased soreness in Rt UE due to doing more at home. Began to work on closed chain in standing . Cont POC, sees MD 12/18. Asked to schedule into Jan.    OBJECTIVE IMPAIRMENTS: decreased activity tolerance, decreased endurance, decreased mobility, decreased ROM, decreased strength, impaired perceived functional ability, impaired flexibility, impaired UE functional use, postural dysfunction, and pain.   ACTIVITY LIMITATIONS: carrying, lifting, bending, sleeping, transfers, bathing, toileting, dressing, self feeding, reach over head, and hygiene/grooming  PARTICIPATION LIMITATIONS: meal prep, cleaning, laundry, driving, shopping, community activity, occupation, and yard work  PERSONAL FACTORS: Time since onset of injury/illness/exacerbation and 3+ comorbidities: anxiety, DM, HTN  are also affecting patient's functional outcome.   REHAB POTENTIAL: Good  CLINICAL DECISION MAKING: Stable/uncomplicated  EVALUATION COMPLEXITY: Low   GOALS: Goals reviewed with patient? No  SHORT TERM GOALS: Target date: 01/06/2023 Pt will demonstrate appropriate understanding and performance of initially prescribed HEP in order to facilitate improved independence with management of symptoms.  Baseline: HEP provided on eval Goal status:MET   2. Pt will score greater than or equal to 45 on FOTO in order to demonstrate improved perception of function due to symptoms.  Baseline: 29  Goal status: MET  LONG TERM GOALS: Target date: 02/17/2023   Pt will score 64 on FOTO in order to demonstrate improved perception of function due to symptoms. Baseline: 29 Goal status: ongoing   2.  Pt will demonstrate at least 150 degrees of active shoulder elevation in order to demonstrate improved tolerance to  functional movement patterns such as overhead reaching. Baseline: deferred on eval given acuity of surgery Goal status: ongoing   3.  Pt will demonstrate at least 4+/5 shoulder flex/abduction MMT for improved symmetry of UE strength and improved tolerance to functional movements.  Baseline: deferred on eval given acuity of surgery Goal status: INITIAL  4. Pt will report/demonstrate ability to perform upper body dressing with less than 2 point increase in pain on NPS in order to indicate improved  tolerance/independence to ADLs.  Baseline: unable to perform, requiring family assist  Goal status: INITIAL   5. Pt will be able to lift at least 40# from floor to waist with less than 2 pt increase in pain in order to facilitate improved tolerance to work tasks.  Baseline: unable  Goal status: INITIAL  PLAN:  PT FREQUENCY: 2x/week  PT DURATION: 12 weeks  PLANNED INTERVENTIONS: 97164- PT Re-evaluation, 97110-Therapeutic exercises, 97530- Therapeutic activity, 97112- Neuromuscular re-education, 97535- Self Care, 91478- Manual therapy, 97014- Electrical stimulation (unattended), 97016- Vasopneumatic device, Patient/Family education, Balance training, Stair training, Taping, Dry Needling, Joint mobilization, Spinal mobilization, Scar mobilization, Cryotherapy, and Moist heat  PLAN FOR NEXT SESSION: check new HEP .  Progress AAROM to AROM.  Manual therapy, scapular retraction, posture  Karie Mainland, PT 12/31/22 11:51 AM Phone: [phone] Fax: [phone]

## 2022-12-31 ENCOUNTER — Ambulatory Visit: Payer: 59 | Admitting: Physical Therapy

## 2022-12-31 ENCOUNTER — Encounter: Payer: Self-pay | Admitting: Physical Therapy

## 2022-12-31 DIAGNOSIS — M25511 Pain in right shoulder: Secondary | ICD-10-CM | POA: Diagnosis not present

## 2022-12-31 DIAGNOSIS — M6281 Muscle weakness (generalized): Secondary | ICD-10-CM

## 2022-12-31 DIAGNOSIS — M25611 Stiffness of right shoulder, not elsewhere classified: Secondary | ICD-10-CM

## 2023-01-03 NOTE — Therapy (Signed)
OUTPATIENT PHYSICAL THERAPY SHOULDER TREATMENT   Patient Name: [name] [name] MRN: [id number] DOB:April 08, 1975, 47 y.o., male Today's Date: 01/04/2023  END OF SESSION:  PT End of Session - 01/04/23 0935     Visit Number 11    Number of Visits 25    Date for PT Re-Evaluation 02/18/23    Authorization Type UHC    Authorization Time Period 60VL no auth required    Progress Note Due on Visit 10    PT Start Time 0932    PT Stop Time 1015    PT Time Calculation (min) 43 min    Activity Tolerance Patient tolerated treatment well    Behavior During Therapy WFL for tasks assessed/performed               Past Medical History:  Diagnosis Date   Anxiety    Diabetes mellitus without complication (HCC)    GERD (gastroesophageal reflux disease)    Hypertension    Obesity (BMI 30-39.9)    Plantar fasciitis of right foot    Skin lesion    Tendonitis    Traumatic tear of left rotator cuff 05/20/2016   Past Surgical History:  Procedure Laterality Date   SHOULDER ACROMIOPLASTY Right 11/23/2022   Procedure: RIGHT SHOULDER ACROMIOPLASTY;  Surgeon: Huel Cote, MD;  Location: [facility];  Service: Orthopedics;  Laterality: Right;   SHOULDER ARTHROSCOPY WITH ROTATOR CUFF REPAIR AND SUBACROMIAL DECOMPRESSION Left 05/21/2016   Procedure: SHOULDER ARTHROSCOPY WITH ROTATOR CUFF REPAIR AND SUBACROMIAL DECOMPRESSION LABRAL DEBRIDEMENT;  Surgeon: Salvatore Marvel, MD;  Location: [facility];  Service: Orthopedics;  Laterality: Left;   VASECTOMY     Patient Active Problem List   Diagnosis Date Noted   Traumatic complete tear of right rotator cuff 11/23/2022   Lab test positive for detection of COVID-19 virus 02/23/2022   Paresthesia 10/23/2018   Traumatic tear of left rotator cuff 05/20/2016   Family history of hypertrophic cardiomyopathy Aug 11, 2014   Family history of sudden cardiac death in son 11-Aug-2014    PCP: Farris Has, MD  REFERRING PROVIDER:  Huel Cote, MD  REFERRING DIAG: [phone] (ICD-10-CM) - Traumatic complete tear of right rotator cuff, initial encounter  THERAPY DIAG:  Right shoulder pain, unspecified chronicity  Stiffness of right shoulder, not elsewhere classified  Muscle weakness (generalized)  Rationale for Evaluation and Treatment: Rehabilitation  ONSET DATE: 11/23/22 rotator cuff repair, superior capsular reconstruction, acromioplasty  SUBJECTIVE:                                                                                                                                                                                      SUBJECTIVE  STATEMENT: Shoulder is feeling great! No pain right now, was sore yesterday.   EVAL: In August pt was working on a ladder, fell backwards but unsure quite how he landed. States he did have a mild concussion but that has since resolved. Previously independent and active for work. Since surgery on 11/23/22, has been receiving assistance from wife and kids for daily tasks. Sleeping in recliner with sling and pillow under arm. Icing 3-4x/day ~60min.  Hand dominance: Right  PERTINENT HISTORY: BIL traumatic RC tears, anxiety, DM, GERD, HTN H/O Lt RCR 2018    PAIN:  Are you having pain: 0/10 today 12/09/22 Location/description: occasional burning and popping; R shoulder, superiorly Best-worst over past week: 0-7/10  - aggravating factors: sleeping and mornings,  - Easing factors: sling, medication    PRECAUTIONS: s/p R RCR 11/23/22; using Dr. Steward Drone rotator cuff repair protocol per op note   WEIGHT BEARING RESTRICTIONS: Yes NWB surgical limb  FALLS:  Has patient fallen in last 6 months? Yes. 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 Lifts and carries up to ~50#  PLOF: Independent  PATIENT GOALS: get back to playing golf, shooting pool  NEXT MD VISIT: Nov 18th   OBJECTIVE:  Note: Objective measures were completed at Evaluation unless otherwise noted.  DIAGNOSTIC FINDINGS:  S/p shoulder surgery 11/23/22 Panel 1: Right RIGHT SHOULDER ARTHROSCOPY WITH SUPERIOR CAPSULAR RECONSTRUCTION / EXTENSIVE DEBRIDEMENT with Huel Cote, MD  Panel 1: Right RIGHT SHOULDER ACROMIOPLASTY with Huel Cote, MD   PATIENT SURVEYS:  FOTO 29>64 predicted Visit 8 , 12/23/22 : 56%  COGNITION: Overall cognitive status: Within functional limits for tasks assessed     SENSATION/OBSERVATION: Surgical site obscured by bandaging Pt denies any sensory complaints since nerve block wore off  POSTURE: Sling RUE, increased R UT elevation  UPPER EXTREMITY ROM:  ROM Right Eval NT Right 12/01/22 Rt  12/21/22 Rt. 01/04/23  Shoulder flexion  115 AAROM 137 deg  AROM 155 supine Standing 70 deg with shoulder hike   Shoulder abduction  80 PROM 100 deg  AROM 126 sidelying   Shoulder internal rotation   AROM FR to Rt lumbar L3 Functional reach  L1-2  Shoulder external rotation  18 PROM 45 AROM 40  AROM at 30 deg 47 deg   Elbow flexion      Elbow extension      Wrist flexion      Wrist extension       (Blank rows = not tested) (Key: WFL = within functional limits not formally assessed, * = concordant pain, s = stiffness/stretching sensation, NT = not tested)  Comments: deferred given acuity of surgery, phase 1 on eval, elbow flex/ext AAROM grossly WNL  UPPER EXTREMITY MMT:  MMT Right eval R 12/27/22   Shoulder flexion    Shoulder extension    Shoulder abduction    Shoulder extension    Shoulder internal rotation  5  Shoulder external rotation  4-  Elbow flexion    Elbow extension    Grip strength    (Blank rows = not tested)   (Key: WFL = within functional limits not formally assessed, * = concordant pain, s = stiffness/stretching sensation, NT = not tested)  Comments: deferred  given acuity of surgery  SHOULDER SPECIAL TESTS: Deferred given acuity of surgery  JOINT MOBILITY TESTING:  Deferred given acuity of surgery    TREATMENT:           [facility] Adult PT Treatment:                                                DATE: 01/04/23 Therapeutic Exercise: Pulleys flexion and scaption 2 min each  Wall slides with foam roller flexion  AAROM foam roller along spine: overhead flexion  Protraction/retraction  AAROM abduction and external rotation Supine scapular retraction  x 10  Rt UE AROM in supine and sidelying Flexion, scaption , ER x 15, punch (flexion) , reverse fly (AROM)  Row x 15 added rotation x 10 green band  Biceps and triceps green band x 15  Manual Therapy: PROM all ranges to tolerance  [facility] Adult PT Treatment:                                                DATE: 12/31/22 Therapeutic Exercise: Supine AAROM with dowel flexion, add/abd ER and chest press  Standing AAROM extension , IR 2  x 10  Seated AAROM abduction, flexion, ER  Standing closed chain shoulder taps , reaches  Lateral high plank walk Wall circle sweeps  Pulleys overhead 140 deg AAROM and scaption  Manual Therapy: Seated inferior glide for GH motion abduction  Teres minor and subscapularis , upper trap pressure point      PATIENT EDUCATION: Education details: Pt education on PT impairments, prognosis, and POC. Informed consent. Rationale for interventions, safe/appropriate HEP performance Person educated: Patient Education method: Explanation, Demonstration, Tactile cues, Verbal cues, and Handouts Education comprehension: verbalized understanding, returned demonstration, verbal cues required, tactile cues required, and needs further education    HOME EXERCISE PROGRAM: Access Code: [email or web address] URL:  [email or web address] Date: 12/21/2022 Prepared by: Karie Mainland    ASSESSMENT:  CLINICAL IMPRESSION:  See above for recent range of motion measurements.  Patient has good pain management, complaining of pain only when reaching endrange and complete resolution with rest.  We have progressed toward active range of motion with his right upper extremity have become strengthening lats upper back and biceps triceps.  His arm fatigues quickly but I expect he will do quite well once cleared for full activity.  He is 6 weeks postop today.Cont POC.    OBJECTIVE IMPAIRMENTS: decreased activity tolerance, decreased endurance, decreased mobility, decreased ROM, decreased strength, impaired perceived functional ability, impaired flexibility, impaired UE functional use, postural dysfunction, and pain.   ACTIVITY LIMITATIONS: carrying, lifting, bending, sleeping, transfers, bathing, toileting, dressing, self feeding, reach over head, and hygiene/grooming  PARTICIPATION LIMITATIONS: meal prep, cleaning, laundry, driving, shopping, community activity, occupation, and yard work  PERSONAL FACTORS: Time since onset of injury/illness/exacerbation and 3+ comorbidities: anxiety, DM, HTN  are also affecting patient's functional outcome.   REHAB POTENTIAL: Good  CLINICAL DECISION MAKING: Stable/uncomplicated  EVALUATION COMPLEXITY: Low   GOALS: Goals reviewed with patient? No  SHORT TERM GOALS: Target date: 01/06/2023 Pt will demonstrate appropriate understanding and performance of initially prescribed HEP in order to facilitate improved independence with management of symptoms.  Baseline: HEP provided on eval Goal status:MET  2. Pt will score greater than or equal to 45 on FOTO in order to demonstrate improved perception of function due to symptoms.  Baseline: 29  Goal status: MET  LONG TERM GOALS: Target date: 02/17/2023   Pt will score 64 on FOTO in order to demonstrate improved  perception of function due to symptoms. Baseline: 29 Goal status: ongoing   2.  Pt will demonstrate at least 150 degrees of active shoulder elevation in order to demonstrate improved tolerance to functional movement patterns such as overhead reaching. Baseline: deferred on eval given acuity of surgery Goal status: ongoing   3.  Pt will demonstrate at least 4+/5 shoulder flex/abduction MMT for improved symmetry of UE strength and improved tolerance to functional movements.  Baseline: deferred on eval given acuity of surgery Goal status: ongoing   4. Pt will report/demonstrate ability to perform upper body dressing with less than 2 point increase in pain on NPS in order to indicate improved tolerance/independence to ADLs.  Baseline: unable to perform, requiring family assist  Goal status: INITIAL   5. Pt will be able to lift at least 40# from floor to waist with less than 2 pt increase in pain in order to facilitate improved tolerance to work tasks.  Baseline: unable  Goal status: INITIAL  PLAN:  PT FREQUENCY: 2x/week  PT DURATION: 12 weeks  PLANNED INTERVENTIONS: 97164- PT Re-evaluation, 97110-Therapeutic exercises, 97530- Therapeutic activity, 97112- Neuromuscular re-education, 97535- Self Care, 78469- Manual therapy, 97014- Electrical stimulation (unattended), 97016- Vasopneumatic device, Patient/Family education, Balance training, Stair training, Taping, Dry Needling, Joint mobilization, Spinal mobilization, Scar mobilization, Cryotherapy, and Moist heat  PLAN FOR NEXT SESSION: what did MD say? check new HEP .  Progress AAROM to AROM.  Manual therapy, scapular retraction, posture  Karie Mainland, PT 01/04/23 2:01 PM Phone: [phone] Fax: [phone]

## 2023-01-04 ENCOUNTER — Ambulatory Visit: Payer: 59 | Admitting: Physical Therapy

## 2023-01-04 DIAGNOSIS — M6281 Muscle weakness (generalized): Secondary | ICD-10-CM

## 2023-01-04 DIAGNOSIS — M25511 Pain in right shoulder: Secondary | ICD-10-CM

## 2023-01-04 DIAGNOSIS — M25611 Stiffness of right shoulder, not elsewhere classified: Secondary | ICD-10-CM

## 2023-01-05 ENCOUNTER — Ambulatory Visit (HOSPITAL_BASED_OUTPATIENT_CLINIC_OR_DEPARTMENT_OTHER): Payer: 59 | Admitting: Orthopaedic Surgery

## 2023-01-05 DIAGNOSIS — S46011A Strain of muscle(s) and tendon(s) of the rotator cuff of right shoulder, initial encounter: Secondary | ICD-10-CM

## 2023-01-05 NOTE — Progress Notes (Signed)
Post Operative Evaluation    Procedure/Date of Surgery: Right shoulder superior capsular reconstruction 11/5  Interval History:   Presents today 6 weeks status post right shoulder superior capsular reconstruction.  Overall he is continuing to improve.  He has begun the active range of motion phase of the protocol.  He has discontinued the sling at this time.  PMH/PSH/Family History/Social History/Meds/Allergies:    Past Medical History:  Diagnosis Date   Anxiety    Diabetes mellitus without complication (HCC)    GERD (gastroesophageal reflux disease)    Hypertension    Obesity (BMI 30-39.9)    Plantar fasciitis of right foot    Skin lesion    Tendonitis    Traumatic tear of left rotator cuff 05/20/2016   Past Surgical History:  Procedure Laterality Date   SHOULDER ACROMIOPLASTY Right 11/23/2022   Procedure: RIGHT SHOULDER ACROMIOPLASTY;  Surgeon: Huel Cote, MD;  Location: [facility];  Service: Orthopedics;  Laterality: Right;   SHOULDER ARTHROSCOPY WITH ROTATOR CUFF REPAIR AND SUBACROMIAL DECOMPRESSION Left 05/21/2016   Procedure: SHOULDER ARTHROSCOPY WITH ROTATOR CUFF REPAIR AND SUBACROMIAL DECOMPRESSION LABRAL DEBRIDEMENT;  Surgeon: Salvatore Marvel, MD;  Location: [facility];  Service: Orthopedics;  Laterality: Left;   VASECTOMY     Social History   Socioeconomic History   Marital status: Married    Spouse name: Not on file   Number of children: 4   Years of education: Not on file   Highest education level: Not on file  Occupational History   Occupation: [employer]  Tobacco Use   Smoking status: Former    Types: Cigarettes   Smokeless tobacco: Former    Types: Snuff  Vaping Use   Vaping status: Never Used  Substance and Sexual Activity   Alcohol use: Yes    Alcohol/week: 0.0 standard drinks of alcohol    Comment: Occas   Drug use: No   Sexual activity: Not on file  Other Topics Concern    Not on file  Social History Narrative   Not on file   Social Drivers of Health   Financial Resource Strain: Not on file  Food Insecurity: Not on file  Transportation Needs: Not on file  Physical Activity: Not on file  Stress: Not on file  Social Connections: Not on file   Family History  Problem Relation Age of Onset   Hypertrophic cardiomyopathy Son    Diabetes type I Son    No Known Allergies Current Outpatient Medications  Medication Sig Dispense Refill   aspirin EC 325 MG tablet Take 1 tablet (325 mg total) by mouth daily. 14 tablet 0   atorvastatin (LIPITOR) 40 MG tablet Take 40 mg by mouth daily.     JARDIANCE 25 MG TABS tablet Take 25 mg by mouth daily.     lisinopril (ZESTRIL) 10 MG tablet Take 10 mg by mouth daily.     metFORMIN (GLUCOPHAGE) 1000 MG tablet Take 1,000 mg by mouth 2 (two) times daily.     oxyCODONE (ROXICODONE) 5 MG immediate release tablet Take 1 tablet (5 mg total) by mouth every 4 (four) hours as needed for severe pain (pain score 7-10) or breakthrough pain. (Patient not taking: Reported on 01/04/2023) 15 tablet 0   sertraline (ZOLOFT) 100 MG tablet Take 200 mg by mouth daily.  Tirzepatide [facility]) Inject into the skin once a week.     No current facility-administered medications for this visit.   No results found.  Review of Systems:   A ROS was performed including pertinent positives and negatives as documented in the HPI.   Musculoskeletal Exam:     Right shoulder is well-appearing without erythema or drainage around surgery sites.  Active forward elevation is to 60 degrees with 140 in the spine position.  External rotation at side is to 45 degrees  Imaging:      I personally reviewed and interpreted the radiographs.   Assessment:   6-week status post right shoulder superior capsular reconstruction overall doing very well.  At this time I discussed I would like him to continue through the active range of motion protocol and  to work on overhead active range of motion.  Overall he is doing quite well for this stage.  I will plan to see him back in 6 weeks for reassessment  Plan :    -Return to clinic in 6 weeks for reassessment      I personally saw and evaluated the patient, and participated in the management and treatment plan.  Huel Cote, MD Attending Physician, Orthopedic Surgery  This document was dictated using Dragon voice recognition software. A reasonable attempt at proof reading has been made to minimize errors.

## 2023-01-05 NOTE — Therapy (Unsigned)
OUTPATIENT PHYSICAL THERAPY SHOULDER TREATMENT   Patient Name: [name] [name] MRN: [id number] DOB:December 24, 1975, 47 y.o., male Today's Date: 01/06/2023  END OF SESSION:  PT End of Session - 01/06/23 1155     Visit Number 12    Number of Visits 25    Date for PT Re-Evaluation 02/18/23    Authorization Type [employer]    Authorization Time Period 60VL no auth required    Progress Note Due on Visit 10    PT Start Time 1148    PT Stop Time 1230    PT Time Calculation (min) 42 min    Activity Tolerance Patient tolerated treatment well    Behavior During Therapy WFL for tasks assessed/performed                Past Medical History:  Diagnosis Date   Anxiety    Diabetes mellitus without complication (HCC)    GERD (gastroesophageal reflux disease)    Hypertension    Obesity (BMI 30-39.9)    Plantar fasciitis of right foot    Skin lesion    Tendonitis    Traumatic tear of left rotator cuff 05/20/2016   Past Surgical History:  Procedure Laterality Date   SHOULDER ACROMIOPLASTY Right 11/23/2022   Procedure: RIGHT SHOULDER ACROMIOPLASTY;  Surgeon: Huel Cote, MD;  Location: [facility];  Service: Orthopedics;  Laterality: Right;   SHOULDER ARTHROSCOPY WITH ROTATOR CUFF REPAIR AND SUBACROMIAL DECOMPRESSION Left 05/21/2016   Procedure: SHOULDER ARTHROSCOPY WITH ROTATOR CUFF REPAIR AND SUBACROMIAL DECOMPRESSION LABRAL DEBRIDEMENT;  Surgeon: Salvatore Marvel, MD;  Location: [facility];  Service: Orthopedics;  Laterality: Left;   VASECTOMY     Patient Active Problem List   Diagnosis Date Noted   Traumatic complete tear of right rotator cuff 11/23/2022   Lab test positive for detection of COVID-19 virus 02/23/2022   Paresthesia 10/23/2018   Traumatic tear of left rotator cuff 05/20/2016   Family history of hypertrophic cardiomyopathy 07/29/14   Family history of sudden cardiac death in son 2014/07/29    PCP: Farris Has, MD  REFERRING PROVIDER:  Huel Cote, MD  REFERRING DIAG: [phone] (ICD-10-CM) - Traumatic complete tear of right rotator cuff, initial encounter  THERAPY DIAG:  Right shoulder pain, unspecified chronicity  Stiffness of right shoulder, not elsewhere classified  Muscle weakness (generalized)  Rationale for Evaluation and Treatment: Rehabilitation  ONSET DATE: 11/23/22 rotator cuff repair, superior capsular reconstruction, acromioplasty  SUBJECTIVE:  SUBJECTIVE STATEMENT: Saw Dr and he was very pleased! He wont be seen until Feb. now.  No pain .   EVAL: In August pt was working on a ladder, fell backwards but unsure quite how he landed. States he did have a mild concussion but that has since resolved. Previously independent and active for work. Since surgery on 11/23/22, has been receiving assistance from wife and kids for daily tasks. Sleeping in recliner with sling and pillow under arm. Icing 3-4x/day ~34min.  Hand dominance: Right  PERTINENT HISTORY: BIL traumatic RC tears, anxiety, DM, GERD, HTN H/O Lt RCR 2018  Recent note : MD  01/05/23: 6-week status post right shoulder superior capsular reconstruction overall doing very well.  At this time I discussed I would like him to continue through the active range of motion protocol and to work on overhead active range of motion.  Overall he is doing quite well for this stage.  I will plan to see him back in 6 weeks for reassessment   PAIN:  Are you having pain: 0/10 today 12/09/22 Location/description: occasional burning and popping; R shoulder, superiorly Best-worst over past week: 0-7/10  - aggravating factors: sleeping and mornings,  - Easing factors: sling, medication    PRECAUTIONS: s/p R RCR 11/23/22; using Dr. Steward Drone rotator cuff repair protocol per op note   WEIGHT  BEARING RESTRICTIONS: Yes NWB surgical limb  FALLS:  Has patient fallen in last 6 months? Yes. 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 Lifts and carries up to ~50#  PLOF: Independent  PATIENT GOALS: get back to playing golf, shooting pool  NEXT MD VISIT: Nov 18th   OBJECTIVE:  Note: Objective measures were completed at Evaluation unless otherwise noted.  DIAGNOSTIC FINDINGS:  S/p shoulder surgery 11/23/22 Panel 1: Right RIGHT SHOULDER ARTHROSCOPY WITH SUPERIOR CAPSULAR RECONSTRUCTION / EXTENSIVE DEBRIDEMENT with Huel Cote, MD  Panel 1: Right RIGHT SHOULDER ACROMIOPLASTY with Huel Cote, MD   PATIENT SURVEYS:  FOTO 29>64 predicted Visit 8 , 12/23/22 : 56%  COGNITION: Overall cognitive status: Within functional limits for tasks assessed     SENSATION/OBSERVATION: Surgical site obscured by bandaging Pt denies any sensory complaints since nerve block wore off  POSTURE: Sling RUE, increased R UT elevation  UPPER EXTREMITY ROM:  ROM Right Eval NT Right 12/01/22 Rt  12/21/22 Rt. 01/04/23  Shoulder flexion  115 AAROM 137 deg  AROM 155 supine Standing 70 deg with shoulder hike   Shoulder abduction  80 PROM 100 deg  AROM 126 sidelying   Shoulder internal rotation   AROM FR to Rt lumbar L3 Functional reach  L1-2  Shoulder external rotation  18 PROM 45 AROM 40  AROM at 30 deg 47 deg   Elbow flexion      Elbow extension      Wrist flexion      Wrist extension       (Blank rows = not tested) (Key: WFL = within functional limits not formally assessed, * = concordant pain, s = stiffness/stretching sensation, NT = not tested)   Comments: deferred given acuity of surgery, phase 1 on eval, elbow flex/ext AAROM grossly WNL  UPPER EXTREMITY MMT:  MMT Right eval R 12/27/22  Shoulder flexion    Shoulder extension    Shoulder abduction    Shoulder extension    Shoulder internal rotation  5  Shoulder external rotation  4-  Elbow flexion    Elbow extension    Grip strength    (Blank rows = not tested)  (Key: WFL = within functional limits not formally assessed, * = concordant pain, s = stiffness/stretching sensation, NT = not tested)  Comments: deferred given acuity of surgery  SHOULDER SPECIAL TESTS: Deferred given acuity of surgery  JOINT MOBILITY TESTING:  Deferred given acuity of surgery    TREATMENT:           [facility] Adult PT Treatment:                                                DATE: 01/06/23 Therapeutic Exercise: Pulleys Dowel AAROM seated : overhead , cross body reaching Standing extension and IR with Dowel  Standing flexion double arm on the wall  Single arm scaption ball on wall  Supine AAROM flexion AROM goal post x 10 , ER and IR  Supine circles x 10 each direction  Triceps x 15 (skull crusher with arm support)   Prone extension 2 x 10  Prone horiz. Abd  Prone row (all AROM )   Manual Therapy: PROM all planes in supine and prone  to tolerance    [facility] Adult PT Treatment:                                                DATE: 01/04/23 Therapeutic Exercise: Pulleys flexion and scaption 2 min each  Wall slides with foam roller flexion  AAROM foam roller along spine: overhead flexion  Protraction/retraction  AAROM abduction and external rotation Supine scapular retraction  x 10  Rt UE AROM in supine and sidelying Flexion, scaption , ER x 15, punch (flexion) , reverse fly (AROM)  Row x 15 added rotation x 10 green band  Biceps and triceps green band x 15  Manual Therapy: PROM all ranges to tolerance  [facility] Adult PT Treatment:                                                DATE:  12/31/22 Therapeutic Exercise: Supine AAROM with dowel flexion, add/abd ER and chest press  Standing AAROM extension , IR 2  x 10  Seated AAROM abduction, flexion, ER  Standing closed chain shoulder taps , reaches  Lateral high plank walk Wall circle sweeps  Pulleys overhead 140 deg AAROM and scaption  Manual Therapy: Seated inferior glide for GH motion abduction  Teres minor and subscapularis , upper trap pressure point      PATIENT EDUCATION: Education details: Pt education on PT impairments, prognosis, and POC. Informed consent. Rationale for interventions, safe/appropriate HEP performance Person educated: Patient Education method: Explanation, Demonstration, Tactile cues, Verbal cues, and Handouts Education comprehension: verbalized understanding, returned demonstration, verbal cues required, tactile cues required, and needs further education    HOME EXERCISE PROGRAM: Access Code: [email or web address] URL: [email or web address] Date: 12/21/2022 Prepared by: Karie Mainland  ASSESSMENT:  CLINICAL IMPRESSION:  Patient received reinforcement on his positive progress from his doctor so far.  He has definitely been able to improve his active range of motion over the past couple of visits.  Will continue to work on active range of motion only until week 8 and accordance with his protocol.  Continue plan of care and push as tolerated.  He was written out of work for 6 more weeks.  OBJECTIVE IMPAIRMENTS: decreased activity tolerance, decreased endurance, decreased mobility, decreased ROM, decreased strength, impaired perceived functional ability, impaired flexibility, impaired UE functional use, postural dysfunction, and pain.   ACTIVITY LIMITATIONS: carrying, lifting, bending, sleeping, transfers, bathing, toileting, dressing, self feeding, reach over head, and hygiene/grooming  PARTICIPATION LIMITATIONS: meal prep, cleaning, laundry, driving, shopping, community activity,  occupation, and yard work  PERSONAL FACTORS: Time since onset of injury/illness/exacerbation and 3+ comorbidities: anxiety, DM, HTN  are also affecting patient's functional outcome.   REHAB POTENTIAL: Good  CLINICAL DECISION MAKING: Stable/uncomplicated  EVALUATION COMPLEXITY: Low   GOALS: Goals reviewed with patient? No  SHORT TERM GOALS: Target date: 01/06/2023 Pt will demonstrate appropriate understanding and performance of initially prescribed HEP in order to facilitate improved independence with management of symptoms.  Baseline: HEP provided on eval Goal status:MET   2. Pt will score greater than or equal to 45 on FOTO in order to demonstrate improved perception of function due to symptoms.  Baseline: 29  Goal status: MET  LONG TERM GOALS: Target date: 02/17/2023   Pt will score 64 on FOTO in order to demonstrate improved perception of function due to symptoms. Baseline: 29 Goal status: ongoing   2.  Pt will demonstrate at least 150 degrees of active shoulder elevation in order to demonstrate improved tolerance to functional movement patterns such as overhead reaching. Baseline: deferred on eval given acuity of surgery Goal status: ongoing   3.  Pt will demonstrate at least 4+/5 shoulder flex/abduction MMT for improved symmetry of UE strength and improved tolerance to functional movements.  Baseline: deferred on eval given acuity of surgery Goal status: ongoing   4. Pt will report/demonstrate ability to perform upper body dressing with less than 2 point increase in pain on NPS in order to indicate improved tolerance/independence to ADLs.  Baseline: unable to perform, requiring family assist  Goal status: INITIAL   5. Pt will be able to lift at least 40# from floor to waist with less than 2 pt increase in pain in order to facilitate improved tolerance to work tasks.  Baseline: unable  Goal status: INITIAL  PLAN:  PT FREQUENCY: 2x/week  PT DURATION: 12  weeks  PLANNED INTERVENTIONS: 97164- PT Re-evaluation, 97110-Therapeutic exercises, 97530- Therapeutic activity, 97112- Neuromuscular re-education, 97535- Self Care, 69629- Manual therapy, 97014- Electrical stimulation (unattended), 97016- Vasopneumatic device, Patient/Family education, Balance training, Stair training, Taping, Dry Needling, Joint mobilization, Spinal mobilization, Scar mobilization, Cryotherapy, and Moist heat  PLAN FOR NEXT SESSION:  Progress AAROM to AROM.  Manual therapy, scapular retraction, posture  Karie Mainland, PT 01/06/23 1:29 PM Phone: [phone] Fax: [phone]

## 2023-01-06 ENCOUNTER — Ambulatory Visit: Payer: 59 | Admitting: Physical Therapy

## 2023-01-06 ENCOUNTER — Encounter: Payer: Self-pay | Admitting: Physical Therapy

## 2023-01-06 DIAGNOSIS — M25511 Pain in right shoulder: Secondary | ICD-10-CM

## 2023-01-06 DIAGNOSIS — M6281 Muscle weakness (generalized): Secondary | ICD-10-CM

## 2023-01-06 DIAGNOSIS — M25611 Stiffness of right shoulder, not elsewhere classified: Secondary | ICD-10-CM

## 2023-01-09 NOTE — Therapy (Unsigned)
OUTPATIENT PHYSICAL THERAPY SHOULDER TREATMENT   Patient Name: [name] [name] MRN: [id number] DOB:1975/05/22, 47 y.o., male Today's Date: 01/09/2023  END OF SESSION:       Past Medical History:  Diagnosis Date   Anxiety    Diabetes mellitus without complication (HCC)    GERD (gastroesophageal reflux disease)    Hypertension    Obesity (BMI 30-39.9)    Plantar fasciitis of right foot    Skin lesion    Tendonitis    Traumatic tear of left rotator cuff 05/20/2016   Past Surgical History:  Procedure Laterality Date   SHOULDER ACROMIOPLASTY Right 11/23/2022   Procedure: RIGHT SHOULDER ACROMIOPLASTY;  Surgeon: Huel Cote, MD;  Location: [facility];  Service: Orthopedics;  Laterality: Right;   SHOULDER ARTHROSCOPY WITH ROTATOR CUFF REPAIR AND SUBACROMIAL DECOMPRESSION Left 05/21/2016   Procedure: SHOULDER ARTHROSCOPY WITH ROTATOR CUFF REPAIR AND SUBACROMIAL DECOMPRESSION LABRAL DEBRIDEMENT;  Surgeon: Salvatore Marvel, MD;  Location: [facility];  Service: Orthopedics;  Laterality: Left;   VASECTOMY     Patient Active Problem List   Diagnosis Date Noted   Traumatic complete tear of right rotator cuff 11/23/2022   Lab test positive for detection of COVID-19 virus 02/23/2022   Paresthesia 10/23/2018   Traumatic tear of left rotator cuff 05/20/2016   Family history of hypertrophic cardiomyopathy 08/15/14   Family history of sudden cardiac death in son 08-15-14    PCP: Farris Has, MD  REFERRING PROVIDER: Huel Cote, MD  REFERRING DIAG: [phone] (ICD-10-CM) - Traumatic complete tear of right rotator cuff, initial encounter  THERAPY DIAG:  No diagnosis found.  Rationale for Evaluation and Treatment: Rehabilitation  ONSET DATE: 11/23/22 rotator cuff repair, superior capsular reconstruction, acromioplasty  SUBJECTIVE:                                                                                                                                                                                       SUBJECTIVE STATEMENT: Saw Dr and he was very pleased! He wont be seen until Feb. now.  No pain .   EVAL: In August pt was working on a ladder, fell backwards but unsure quite how he landed. States he did have a mild concussion but that has since resolved. Previously independent and active for work. Since surgery on 11/23/22, has been receiving assistance from wife and kids for daily tasks. Sleeping in recliner with sling and pillow under arm. Icing 3-4x/day ~64min.  Hand dominance: Right  PERTINENT HISTORY: BIL traumatic RC tears, anxiety, DM, GERD, HTN H/O Lt RCR 2018  Recent note : MD  01/05/23: 6-week status post right shoulder superior capsular reconstruction overall doing  very well.  At this time I discussed I would like him to continue through the active range of motion protocol and to work on overhead active range of motion.  Overall he is doing quite well for this stage.  I will plan to see him back in 6 weeks for reassessment   PAIN:  Are you having pain: 0/10 today 12/09/22 Location/description: occasional burning and popping; R shoulder, superiorly Best-worst over past week: 0-7/10  - aggravating factors: sleeping and mornings,  - Easing factors: sling, medication    PRECAUTIONS: s/p R RCR 11/23/22; using Dr. Steward Drone rotator cuff repair protocol per op note   WEIGHT BEARING RESTRICTIONS: Yes NWB surgical limb  FALLS:  Has patient fallen in last 6 months? Yes. 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 Lifts and carries up to ~50#  PLOF: Independent  PATIENT GOALS: get back to  playing golf, shooting pool  NEXT MD VISIT: Nov 18th   OBJECTIVE:  Note: Objective measures were completed at Evaluation unless otherwise noted.  DIAGNOSTIC FINDINGS:  S/p shoulder surgery 11/23/22 Panel 1: Right RIGHT SHOULDER ARTHROSCOPY WITH SUPERIOR CAPSULAR RECONSTRUCTION / EXTENSIVE DEBRIDEMENT with Huel Cote, MD  Panel 1: Right RIGHT SHOULDER ACROMIOPLASTY with Huel Cote, MD   PATIENT SURVEYS:  FOTO 29>64 predicted Visit 8 , 12/23/22 : 56%  COGNITION: Overall cognitive status: Within functional limits for tasks assessed     SENSATION/OBSERVATION: Surgical site obscured by bandaging Pt denies any sensory complaints since nerve block wore off  POSTURE: Sling RUE, increased R UT elevation  UPPER EXTREMITY ROM:  ROM Right Eval NT Right 12/01/22 Rt  12/21/22 Rt. 01/04/23  Shoulder flexion  115 AAROM 137 deg  AROM 155 supine Standing 70 deg with shoulder hike   Shoulder abduction  80 PROM 100 deg  AROM 126 sidelying   Shoulder internal rotation   AROM FR to Rt lumbar L3 Functional reach  L1-2  Shoulder external rotation  18 PROM 45 AROM 40  AROM at 30 deg 47 deg   Elbow flexion      Elbow extension      Wrist flexion      Wrist extension       (Blank rows = not tested) (Key: WFL = within functional limits not formally assessed, * = concordant pain, s = stiffness/stretching sensation, NT = not tested)  Comments: deferred given acuity of surgery, phase 1 on eval, elbow flex/ext AAROM grossly WNL  UPPER EXTREMITY MMT:  MMT Right eval R 12/27/22   Shoulder flexion    Shoulder extension    Shoulder abduction    Shoulder extension    Shoulder internal rotation  5  Shoulder external rotation  4-  Elbow flexion    Elbow extension    Grip strength    (Blank rows = not tested)  (Key: WFL = within functional limits not formally assessed, * = concordant pain, s = stiffness/stretching sensation, NT = not tested)  Comments: deferred given acuity of  surgery  SHOULDER SPECIAL TESTS: Deferred given acuity of surgery  JOINT MOBILITY TESTING:  Deferred given acuity of surgery    TREATMENT:          [facility] Adult PT Treatment:  DATE: *** Therapeutic Exercise: *** Manual Therapy: *** Neuromuscular re-ed: *** Therapeutic Activity: *** Modalities: *** Self Care: ***  [facility] Adult PT Treatment:                                                DATE: 01/06/23 Therapeutic Exercise: Pulleys Dowel AAROM seated : overhead , cross body reaching Standing extension and IR with Dowel  Standing flexion double arm on the wall  Single arm scaption ball on wall  Supine AAROM flexion AROM goal post x 10 , ER and IR  Supine circles x 10 each direction  Triceps x 15 (skull crusher with arm support)   Prone extension 2 x 10  Prone horiz. Abd  Prone row (all AROM )   Manual Therapy: PROM all planes in supine and prone  to tolerance    [facility] Adult PT Treatment:                                                DATE: 01/04/23 Therapeutic Exercise: Pulleys flexion and scaption 2 min each  Wall slides with foam roller flexion  AAROM foam roller along spine: overhead flexion  Protraction/retraction  AAROM abduction and external rotation Supine scapular retraction  x 10  Rt UE AROM in supine and sidelying Flexion, scaption , ER x 15, punch (flexion) , reverse fly (AROM)  Row x 15 added rotation x 10 green band  Biceps and triceps green band x 15  Manual Therapy: PROM all ranges to tolerance  [facility] Adult PT Treatment:                                                DATE: 12/31/22 Therapeutic Exercise: Supine AAROM with dowel flexion, add/abd ER and chest press  Standing AAROM extension , IR 2  x 10  Seated AAROM abduction, flexion, ER  Standing closed chain shoulder taps , reaches  Lateral high plank walk Wall circle sweeps  Pulleys overhead 140 deg AAROM and scaption  Manual Therapy: Seated  inferior glide for GH motion abduction  Teres minor and subscapularis , upper trap pressure point      PATIENT EDUCATION: Education details: Pt education on PT impairments, prognosis, and POC. Informed consent. Rationale for interventions, safe/appropriate HEP performance Person educated: Patient Education method: Explanation, Demonstration, Tactile cues, Verbal cues, and Handouts Education comprehension: verbalized understanding, returned demonstration, verbal cues required, tactile cues required, and needs further education    HOME EXERCISE PROGRAM: Access Code: [email or web address] URL: [email or web address] Date: 12/21/2022 Prepared by: Karie Mainland    ASSESSMENT:  CLINICAL IMPRESSION:  Patient received reinforcement on his positive progress from his doctor so far.  He has definitely been able to improve his active range of motion over the past couple of visits.  Will continue to work on active range of motion only until week 8 and accordance with his protocol.  Continue plan of care and push as tolerated.  He was written out of work for 6 more weeks.  OBJECTIVE IMPAIRMENTS: decreased activity tolerance, decreased endurance, decreased mobility, decreased ROM, decreased strength, impaired perceived functional ability,  impaired flexibility, impaired UE functional use, postural dysfunction, and pain.   ACTIVITY LIMITATIONS: carrying, lifting, bending, sleeping, transfers, bathing, toileting, dressing, self feeding, reach over head, and hygiene/grooming  PARTICIPATION LIMITATIONS: meal prep, cleaning, laundry, driving, shopping, community activity, occupation, and yard work  PERSONAL FACTORS: Time since onset of injury/illness/exacerbation and 3+ comorbidities: anxiety, DM, HTN  are also affecting patient's functional outcome.   REHAB POTENTIAL: Good  CLINICAL DECISION MAKING: Stable/uncomplicated  EVALUATION COMPLEXITY: Low   GOALS: Goals reviewed with patient? No  SHORT  TERM GOALS: Target date: 01/06/2023 Pt will demonstrate appropriate understanding and performance of initially prescribed HEP in order to facilitate improved independence with management of symptoms.  Baseline: HEP provided on eval Goal status:MET   2. Pt will score greater than or equal to 45 on FOTO in order to demonstrate improved perception of function due to symptoms.  Baseline: 29  Goal status: MET  LONG TERM GOALS: Target date: 02/17/2023   Pt will score 64 on FOTO in order to demonstrate improved perception of function due to symptoms. Baseline: 29 Goal status: ongoing   2.  Pt will demonstrate at least 150 degrees of active shoulder elevation in order to demonstrate improved tolerance to functional movement patterns such as overhead reaching. Baseline: deferred on eval given acuity of surgery Goal status: ongoing   3.  Pt will demonstrate at least 4+/5 shoulder flex/abduction MMT for improved symmetry of UE strength and improved tolerance to functional movements.  Baseline: deferred on eval given acuity of surgery Goal status: ongoing   4. Pt will report/demonstrate ability to perform upper body dressing with less than 2 point increase in pain on NPS in order to indicate improved tolerance/independence to ADLs.  Baseline: unable to perform, requiring family assist  Goal status: INITIAL   5. Pt will be able to lift at least 40# from floor to waist with less than 2 pt increase in pain in order to facilitate improved tolerance to work tasks.  Baseline: unable  Goal status: INITIAL  PLAN:  PT FREQUENCY: 2x/week  PT DURATION: 12 weeks  PLANNED INTERVENTIONS: 97164- PT Re-evaluation, 97110-Therapeutic exercises, 97530- Therapeutic activity, 97112- Neuromuscular re-education, 97535- Self Care, 44034- Manual therapy, 97014- Electrical stimulation (unattended), 97016- Vasopneumatic device, Patient/Family education, Balance training, Stair training, Taping, Dry Needling, Joint  mobilization, Spinal mobilization, Scar mobilization, Cryotherapy, and Moist heat  PLAN FOR NEXT SESSION:  Progress AAROM to AROM.  Manual therapy, scapular retraction, posture  Karie Mainland, PT 01/09/23 7:06 PM Phone: [phone] Fax: [phone]

## 2023-01-10 ENCOUNTER — Ambulatory Visit: Payer: 59 | Admitting: Physical Therapy

## 2023-01-10 ENCOUNTER — Encounter: Payer: Self-pay | Admitting: Physical Therapy

## 2023-01-10 DIAGNOSIS — M6281 Muscle weakness (generalized): Secondary | ICD-10-CM

## 2023-01-10 DIAGNOSIS — M25511 Pain in right shoulder: Secondary | ICD-10-CM

## 2023-01-10 DIAGNOSIS — M25611 Stiffness of right shoulder, not elsewhere classified: Secondary | ICD-10-CM

## 2023-01-10 NOTE — Therapy (Signed)
OUTPATIENT PHYSICAL THERAPY SHOULDER TREATMENT   Patient Name: [name] [name] MRN: [id number] DOB:December 26, 1975, 47 y.o., male Today's Date: 01/10/2023  END OF SESSION:  PT End of Session - 01/10/23 1018     Visit Number 13    Number of Visits 25    Date for PT Re-Evaluation 02/18/23    Authorization Type [employer]    Authorization Time Period 60VL no auth required    Progress Note Due on Visit 10    PT Start Time 1016    PT Stop Time 1054    PT Time Calculation (min) 38 min                Past Medical History:  Diagnosis Date   Anxiety    Diabetes mellitus without complication (HCC)    GERD (gastroesophageal reflux disease)    Hypertension    Obesity (BMI 30-39.9)    Plantar fasciitis of right foot    Skin lesion    Tendonitis    Traumatic tear of left rotator cuff 05/20/2016   Past Surgical History:  Procedure Laterality Date   SHOULDER ACROMIOPLASTY Right 11/23/2022   Procedure: RIGHT SHOULDER ACROMIOPLASTY;  Surgeon: Huel Cote, MD;  Location: [facility];  Service: Orthopedics;  Laterality: Right;   SHOULDER ARTHROSCOPY WITH ROTATOR CUFF REPAIR AND SUBACROMIAL DECOMPRESSION Left 05/21/2016   Procedure: SHOULDER ARTHROSCOPY WITH ROTATOR CUFF REPAIR AND SUBACROMIAL DECOMPRESSION LABRAL DEBRIDEMENT;  Surgeon: Salvatore Marvel, MD;  Location: [facility];  Service: Orthopedics;  Laterality: Left;   VASECTOMY     Patient Active Problem List   Diagnosis Date Noted   Traumatic complete tear of right rotator cuff 11/23/2022   Lab test positive for detection of COVID-19 virus 02/23/2022   Paresthesia 10/23/2018   Traumatic tear of left rotator cuff 05/20/2016   Family history of hypertrophic cardiomyopathy 08/01/14   Family history of sudden cardiac death in son 2014/08/01    PCP: Farris Has, MD  REFERRING PROVIDER: Huel Cote, MD  REFERRING DIAG: [phone] (ICD-10-CM) - Traumatic complete tear of right rotator cuff, initial  encounter  THERAPY DIAG:  Right shoulder pain, unspecified chronicity  Stiffness of right shoulder, not elsewhere classified  Muscle weakness (generalized)  Rationale for Evaluation and Treatment: Rehabilitation  ONSET DATE: 11/23/22 rotator cuff repair, superior capsular reconstruction, acromioplasty  SUBJECTIVE:                                                                                                                                                                                      SUBJECTIVE STATEMENT: No pain .   EVAL: In August pt was working on a ladder, fell backwards but  unsure quite how he landed. States he did have a mild concussion but that has since resolved. Previously independent and active for work. Since surgery on 11/23/22, has been receiving assistance from wife and kids for daily tasks. Sleeping in recliner with sling and pillow under arm. Icing 3-4x/day ~79min.  Hand dominance: Right  PERTINENT HISTORY: BIL traumatic RC tears, anxiety, DM, GERD, HTN H/O Lt RCR 2018  Recent note : MD  01/05/23: 6-week status post right shoulder superior capsular reconstruction overall doing very well.  At this time I discussed I would like him to continue through the active range of motion protocol and to work on overhead active range of motion.  Overall he is doing quite well for this stage.  I will plan to see him back in 6 weeks for reassessment   PAIN:  Are you having pain: 0/10 today 12/09/22 Location/description: occasional burning and popping; R shoulder, superiorly Best-worst over past week: 0-7/10  - aggravating factors: sleeping and mornings,  - Easing factors: sling, medication    PRECAUTIONS: s/p R RCR 11/23/22; using Dr. Steward Drone rotator cuff repair protocol per op note   WEIGHT BEARING RESTRICTIONS: Yes NWB surgical limb  FALLS:  Has patient fallen in last 6 months? Yes. 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 Lifts and carries up to ~50#  PLOF: Independent  PATIENT GOALS: get back to playing golf, shooting pool  NEXT MD VISIT: Nov 18th   OBJECTIVE:  Note: Objective measures were completed at Evaluation unless otherwise noted.  DIAGNOSTIC FINDINGS:  S/p shoulder surgery 11/23/22 Panel 1: Right RIGHT SHOULDER ARTHROSCOPY WITH SUPERIOR CAPSULAR RECONSTRUCTION / EXTENSIVE DEBRIDEMENT with Huel Cote, MD  Panel 1: Right RIGHT SHOULDER ACROMIOPLASTY with Huel Cote, MD   PATIENT SURVEYS:  FOTO 29>64 predicted Visit 8 , 12/23/22 : 56%  COGNITION: Overall cognitive status: Within functional limits for tasks assessed     SENSATION/OBSERVATION: Surgical site obscured by bandaging Pt denies any sensory complaints since nerve block wore off  POSTURE: Sling RUE, increased R UT elevation  UPPER EXTREMITY ROM:  ROM Right Eval NT Right 12/01/22 Rt  12/21/22 Rt. 01/04/23 RT 01/10/23  Shoulder flexion  115 AAROM 137 deg  AROM 155 supine Standing 70 deg with shoulder hike    Shoulder abduction  80 PROM 100 deg  AROM 126 sidelying    Shoulder internal rotation   AROM FR to Rt lumbar L3 Functional reach  L1-2   Shoulder external rotation  18 PROM 45 AROM 40  AROM at 30 deg 47 deg  FR to right top of head   Elbow flexion       Elbow extension       Wrist flexion       Wrist extension        (Blank rows = not tested) (Key: WFL = within functional limits not formally assessed, * = concordant pain, s = stiffness/stretching sensation, NT = not tested)  Comments: deferred given acuity of surgery, phase 1 on eval, elbow flex/ext AAROM grossly WNL  UPPER EXTREMITY MMT:  MMT Right eval  R 12/27/22   Shoulder flexion    Shoulder extension    Shoulder abduction    Shoulder extension  Shoulder internal rotation  5  Shoulder external rotation  4-  Elbow flexion    Elbow extension    Grip strength    (Blank rows = not tested)  (Key: WFL = within functional limits not formally assessed, * = concordant pain, s = stiffness/stretching sensation, NT = not tested)  Comments: deferred given acuity of surgery  SHOULDER SPECIAL TESTS: Deferred given acuity of surgery  JOINT MOBILITY TESTING:  Deferred given acuity of surgery    TREATMENT:          [facility] Adult PT Treatment:                                                DATE: 01/10/23 Therapeutic Exercise: Pulleys  Standing cane flexion, abdct ER AAROM Standing cane ext and IR AAROM Side lying abdct AROM Sidelying flexion AROM  Sidelying ER AROM  Supine chest press and pullovers  Supine short lever flexion punch  Supine protraction AROM  Supine long lever AROM flexion x 10 ER stretch in doorway , low  Manual Therapy: PROM all ranges to tolerance      [facility] Adult PT Treatment:                                                DATE: 01/06/23 Therapeutic Exercise: Pulleys Dowel AAROM seated : overhead , cross body reaching Standing extension and IR with Dowel  Standing flexion double arm on the wall  Single arm scaption ball on wall  Supine AAROM flexion AROM goal post x 10 , ER and IR  Supine circles x 10 each direction  Triceps x 15 (skull crusher with arm support)   Prone extension 2 x 10  Prone horiz. Abd  Prone row (all AROM )   Manual Therapy: PROM all planes in supine and prone  to tolerance    [facility] Adult PT Treatment:                                                DATE: 01/04/23 Therapeutic Exercise: Pulleys flexion and scaption 2 min each  Wall slides with foam roller flexion  AAROM foam roller along spine: overhead flexion  Protraction/retraction  AAROM abduction and external rotation Supine  scapular retraction  x 10  Rt UE AROM in supine and sidelying Flexion, scaption , ER x 15, punch (flexion) , reverse fly (AROM)  Row x 15 added rotation x 10 green band  Biceps and triceps green band x 15  Manual Therapy: PROM all ranges to tolerance    [facility] Adult PT Treatment:                                                DATE: 12/31/22 Therapeutic Exercise: Supine AAROM with dowel flexion, add/abd ER and chest press  Standing AAROM extension , IR 2  x 10  Seated AAROM abduction, flexion, ER  Standing closed chain shoulder taps , reaches  Lateral high plank walk  Wall circle sweeps  Pulleys overhead 140 deg AAROM and scaption  Manual Therapy: Seated inferior glide for GH motion abduction  Teres minor and subscapularis , upper trap pressure point      PATIENT EDUCATION: Education details: Pt education on PT impairments, prognosis, and POC. Informed consent. Rationale for interventions, safe/appropriate HEP performance Person educated: Patient Education method: Explanation, Demonstration, Tactile cues, Verbal cues, and Handouts Education comprehension: verbalized understanding, returned demonstration, verbal cues required, tactile cues required, and needs further education    HOME EXERCISE PROGRAM: Access Code: [email or web address] URL: [email or web address] Date: 12/21/2022 Prepared by: Karie Mainland    ASSESSMENT:  CLINICAL IMPRESSION:  Pt reports no pain on arrival. Continued with PROM, AAROM and AROM. Encouraged low stretching at wall for ER. He fatigues with repetitive AROM in all planes. He is limited in AROM for ER, reaching to the right side of his head.  Will continue to work on active range of motion only until week 8 and accordance with his protocol.  Continue plan of care and push as tolerated.  He was written out of work for 6 more weeks.  OBJECTIVE IMPAIRMENTS: decreased activity tolerance, decreased endurance, decreased mobility, decreased ROM, decreased  strength, impaired perceived functional ability, impaired flexibility, impaired UE functional use, postural dysfunction, and pain.   ACTIVITY LIMITATIONS: carrying, lifting, bending, sleeping, transfers, bathing, toileting, dressing, self feeding, reach over head, and hygiene/grooming  PARTICIPATION LIMITATIONS: meal prep, cleaning, laundry, driving, shopping, community activity, occupation, and yard work  PERSONAL FACTORS: Time since onset of injury/illness/exacerbation and 3+ comorbidities: anxiety, DM, HTN  are also affecting patient's functional outcome.   REHAB POTENTIAL: Good  CLINICAL DECISION MAKING: Stable/uncomplicated  EVALUATION COMPLEXITY: Low   GOALS: Goals reviewed with patient? No  SHORT TERM GOALS: Target date: 01/06/2023 Pt will demonstrate appropriate understanding and performance of initially prescribed HEP in order to facilitate improved independence with management of symptoms.  Baseline: HEP provided on eval Goal status:MET   2. Pt will score greater than or equal to 45 on FOTO in order to demonstrate improved perception of function due to symptoms.  Baseline: 29  Goal status: MET  LONG TERM GOALS: Target date: 02/17/2023   Pt will score 64 on FOTO in order to demonstrate improved perception of function due to symptoms. Baseline: 29 Goal status: ongoing   2.  Pt will demonstrate at least 150 degrees of active shoulder elevation in order to demonstrate improved tolerance to functional movement patterns such as overhead reaching. Baseline: deferred on eval given acuity of surgery Goal status: ongoing   3.  Pt will demonstrate at least 4+/5 shoulder flex/abduction MMT for improved symmetry of UE strength and improved tolerance to functional movements.  Baseline: deferred on eval given acuity of surgery Goal status: ongoing   4. Pt will report/demonstrate ability to perform upper body dressing with less than 2 point increase in pain on NPS in order to  indicate improved tolerance/independence to ADLs.  Baseline: unable to perform, requiring family assist  Goal status: INITIAL   5. Pt will be able to lift at least 40# from floor to waist with less than 2 pt increase in pain in order to facilitate improved tolerance to work tasks.  Baseline: unable  Goal status: INITIAL  PLAN:  PT FREQUENCY: 2x/week  PT DURATION: 12 weeks  PLANNED INTERVENTIONS: 97164- PT Re-evaluation, 97110-Therapeutic exercises, 97530- Therapeutic activity, 97112- Neuromuscular re-education, 97535- Self Care, 69629- Manual therapy, 97014- Electrical stimulation (unattended), 97016- Vasopneumatic device, Patient/Family education, Balance training, Stair training,  Taping, Dry Needling, Joint mobilization, Spinal mobilization, Scar mobilization, Cryotherapy, and Moist heat  PLAN FOR NEXT SESSION:  Progress AAROM to AROM.  Manual therapy, scapular retraction, posture  Jannette Spanner, PTA 01/10/23 11:12 AM Phone: [phone] Fax: [phone]

## 2023-01-13 NOTE — Therapy (Signed)
OUTPATIENT PHYSICAL THERAPY SHOULDER TREATMENT   Patient Name: [name] [name] MRN: [id number] DOB:06-07-1975, 47 y.o., male Today's Date: 01/14/2023  END OF SESSION:  PT End of Session - 01/14/23 0938     Visit Number 14    Number of Visits 25    Date for PT Re-Evaluation 02/18/23    Authorization Type [employer]    Authorization Time Period 60VL no auth required    Progress Note Due on Visit 10    PT Start Time 0935    PT Stop Time 1015    PT Time Calculation (min) 40 min    Activity Tolerance Patient tolerated treatment well    Behavior During Therapy WFL for tasks assessed/performed                 Past Medical History:  Diagnosis Date   Anxiety    Diabetes mellitus without complication (HCC)    GERD (gastroesophageal reflux disease)    Hypertension    Obesity (BMI 30-39.9)    Plantar fasciitis of right foot    Skin lesion    Tendonitis    Traumatic tear of left rotator cuff 05/20/2016   Past Surgical History:  Procedure Laterality Date   SHOULDER ACROMIOPLASTY Right 11/23/2022   Procedure: RIGHT SHOULDER ACROMIOPLASTY;  Surgeon: Huel Cote, MD;  Location: [facility];  Service: Orthopedics;  Laterality: Right;   SHOULDER ARTHROSCOPY WITH ROTATOR CUFF REPAIR AND SUBACROMIAL DECOMPRESSION Left 05/21/2016   Procedure: SHOULDER ARTHROSCOPY WITH ROTATOR CUFF REPAIR AND SUBACROMIAL DECOMPRESSION LABRAL DEBRIDEMENT;  Surgeon: Salvatore Marvel, MD;  Location: [facility];  Service: Orthopedics;  Laterality: Left;   VASECTOMY     Patient Active Problem List   Diagnosis Date Noted   Traumatic complete tear of right rotator cuff 11/23/2022   Lab test positive for detection of COVID-19 virus 02/23/2022   Paresthesia 10/23/2018   Traumatic tear of left rotator cuff 05/20/2016   Family history of hypertrophic cardiomyopathy 08/02/2014   Family history of sudden cardiac death in son 08-02-2014    PCP: Farris Has, MD  REFERRING PROVIDER:  Huel Cote, MD  REFERRING DIAG: [phone] (ICD-10-CM) - Traumatic complete tear of right rotator cuff, initial encounter  THERAPY DIAG:  Right shoulder pain, unspecified chronicity  Stiffness of right shoulder, not elsewhere classified  Muscle weakness (generalized)  Rationale for Evaluation and Treatment: Rehabilitation  ONSET DATE: 11/23/22 rotator cuff repair, superior capsular reconstruction, acromioplasty  SUBJECTIVE:  SUBJECTIVE STATEMENT: Arm just feels tight no pain during the day.  Going to the beach for a week!  EVAL: In August pt was working on a ladder, fell backwards but unsure quite how he landed. States he did have a mild concussion but that has since resolved. Previously independent and active for work. Since surgery on 11/23/22, has been receiving assistance from wife and kids for daily tasks. Sleeping in recliner with sling and pillow under arm. Icing 3-4x/day ~69min.  Hand dominance: Right  PERTINENT HISTORY: BIL traumatic RC tears, anxiety, DM, GERD, HTN H/O Lt RCR 2018  Recent note : MD  01/05/23: 6-week status post right shoulder superior capsular reconstruction overall doing very well.  At this time I discussed I would like him to continue through the active range of motion protocol and to work on overhead active range of motion.  Overall he is doing quite well for this stage.  I will plan to see him back in 6 weeks for reassessment   PAIN:  Are you having pain: 0/10 today 12/09/22 Location/description: occasional burning and popping; R shoulder, superiorly Best-worst over past week: 0-7/10  - aggravating factors: sleeping and mornings,  - Easing factors: sling, medication    PRECAUTIONS: s/p R RCR 11/23/22; using Dr. Steward Drone rotator cuff repair protocol per op note   WEIGHT  BEARING RESTRICTIONS: Yes NWB surgical limb  FALLS:  Has patient fallen in last 6 months? Yes. 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 Lifts and carries up to ~50#  PLOF: Independent  PATIENT GOALS: get back to playing golf, shooting pool  NEXT MD VISIT: Nov 18th   OBJECTIVE:  Note: Objective measures were completed at Evaluation unless otherwise noted.  DIAGNOSTIC FINDINGS:  S/p shoulder surgery 11/23/22 Panel 1: Right RIGHT SHOULDER ARTHROSCOPY WITH SUPERIOR CAPSULAR RECONSTRUCTION / EXTENSIVE DEBRIDEMENT with Huel Cote, MD  Panel 1: Right RIGHT SHOULDER ACROMIOPLASTY with Huel Cote, MD   PATIENT SURVEYS:  FOTO 29>64 predicted Visit 8 , 12/23/22 : 56%    COGNITION: Overall cognitive status: Within functional limits for tasks assessed     SENSATION/OBSERVATION: Surgical site obscured by bandaging Pt denies any sensory complaints since nerve block wore off  POSTURE: Sling RUE, increased R UT elevation  UPPER EXTREMITY ROM:  ROM Right Eval NT Right 12/01/22 Rt  12/21/22 Rt. 01/04/23 RT 01/10/23  Shoulder flexion  115 AAROM 137 deg  AROM 155 supine Standing 70 deg with shoulder hike    Shoulder abduction  80 PROM 100 deg  AROM 126 sidelying    Shoulder internal rotation   AROM FR to Rt lumbar L3 Functional reach  L1-2   Shoulder external rotation  18 PROM 45 AROM 40  AROM at 30 deg 47 deg  FR to right top of head   Elbow flexion       Elbow extension       Wrist flexion       Wrist extension        (Blank rows = not tested) (Key: WFL = within functional limits not formally assessed, * = concordant pain, s =  stiffness/stretching sensation, NT = not tested)  Comments: deferred given acuity of surgery, phase 1 on eval, elbow  flex/ext AAROM grossly WNL  UPPER EXTREMITY MMT:  MMT Right eval R 12/27/22   Shoulder flexion    Shoulder extension    Shoulder abduction    Shoulder extension    Shoulder internal rotation  5  Shoulder external rotation  4-  Elbow flexion    Elbow extension    Grip strength    (Blank rows = not tested)  (Key: WFL = within functional limits not formally assessed, * = concordant pain, s = stiffness/stretching sensation, NT = not tested)  Comments: deferred given acuity of surgery  SHOULDER SPECIAL TESTS: Deferred given acuity of surgery  JOINT MOBILITY TESTING:  Deferred given acuity of surgery    TREATMENT:           [facility] Adult PT Treatment:                                                DATE: 01/14/23 Therapeutic Exercise: Pulleys flexion, scaption, internal rotation Standing dowel overhead lift , cross body reaching , abduction, extension  Extension/IR dowel  Red ball rolls 110 deg 1 min each direction Doorway/corner stretch 30 sec  x 5  Supine dowel flexion full ROM  Cactus Arms ER/IR  Manual Therapy: PROM all ranges to tolerance   [facility] Adult PT Treatment:                                                DATE: 01/10/23 Therapeutic Exercise: Pulleys  Standing cane flexion, abdct ER AAROM Standing cane ext and IR AAROM Side lying abdct AROM Sidelying flexion AROM  Sidelying ER AROM  Supine chest press and pullovers  Supine short lever flexion punch  Supine protraction AROM  Supine long lever AROM flexion x 10 ER stretch in doorway , low  Manual Therapy: PROM all ranges to tolerance      [facility] Adult PT Treatment:                                                DATE: 01/06/23 Therapeutic Exercise: Pulleys Dowel AAROM seated : overhead , cross body reaching Standing extension and IR with Dowel  Standing flexion double arm on the wall   Single arm scaption ball on wall  Supine AAROM flexion AROM goal post x 10 , ER and IR  Supine circles x 10 each direction  Triceps x 15 (skull crusher with arm support)   Prone extension 2 x 10  Prone horiz. Abd  Prone row (all AROM )   Manual Therapy: PROM all planes in supine and prone to tolerance    [facility] Adult PT Treatment:                                                DATE: 01/04/23 Therapeutic Exercise: Pulleys flexion and scaption 2 min each  Wall slides with foam roller flexion  AAROM foam roller along spine: overhead flexion  Protraction/retraction  AAROM abduction and external rotation Supine scapular  retraction  x 10  Rt UE AROM in supine and sidelying Flexion, scaption , ER x 15, punch (flexion) , reverse fly (AROM)  Row x 15 added rotation x 10 green band  Biceps and triceps green band x 15  Manual Therapy: PROM all ranges to tolerance    [facility] Adult PT Treatment:                                                DATE: 12/31/22 Therapeutic Exercise: Supine AAROM with dowel flexion, add/abd ER and chest press  Standing AAROM extension , IR 2  x 10  Seated AAROM abduction, flexion, ER  Standing closed chain shoulder taps , reaches  Lateral high plank walk Wall circle sweeps  Pulleys overhead 140 deg AAROM and scaption  Manual Therapy: Seated inferior glide for GH motion abduction  Teres minor and subscapularis , upper trap pressure point      PATIENT EDUCATION: Education details: Pt education on PT impairments, prognosis, and POC. Informed consent. Rationale for interventions, safe/appropriate HEP performance Person educated: Patient Education method: Explanation, Demonstration, Tactile cues, Verbal cues, and Handouts Education comprehension: verbalized understanding, returned demonstration, verbal cues required, tactile cues required, and needs further education    HOME EXERCISE PROGRAM:  Access Code: [email or web address] URL:  [email or web address] Date: 01/14/2023 Prepared by: Karie Mainland  Program Notes - with seated elbow flexion, work on gently straightening and bending the elbow with the support of the other arm  Exercises - Seated Elbow Extension with Self-Anchored Resistance  - 1 x daily - 7 x weekly - 2 sets - 10 reps - 5 hold - Standing Elbow Flexion with Resistance  - 1 x daily - 7 x weekly - 2 sets - 10 reps - 5 hold - Standing Shoulder External Rotation Stretch in Doorway  - 1 x daily - 7 x weekly - 2 sets - 10 reps - 10-20 hold - Standing Shoulder Abduction AAROM with Dowel  - 1 x daily - 7 x weekly - 2 sets - 10 reps - 10 hold - Standing Shoulder Extension with Dowel  - 1 x daily - 7 x weekly - 2 sets - 10 reps - 10 hold - Standing Shoulder Flexion AAROM with Dowel  - 1 x daily - 7 x weekly - 2 sets - 10 reps - 10 hold - Standing Shoulder Internal Rotation AAROM with Dowel  - 1 x daily - 7 x weekly - 2 sets - 10 reps - 10 hold - Seated Scapular Retraction  - 1 x daily - 7 x weekly - 2 sets - 10 reps - 10 hold - Corner Pec Major Stretch  - 1-2 x daily - 7 x weekly - 1 sets - 5 reps - 30 hold  ASSESSMENT:  CLINICAL IMPRESSION:  Patient continues to demonstrate functional gains with Rt UE attaining near full ROM actively in flexion, IR.  He will have > 1 week off from PT for a trip.  He was encouraged to cont to perform his stretching exercises to maintain his mobility and net week begins strengthening phase of PT.    OBJECTIVE IMPAIRMENTS: decreased activity tolerance, decreased endurance, decreased mobility, decreased ROM, decreased strength, impaired perceived functional ability, impaired flexibility, impaired UE functional use, postural dysfunction, and pain.   ACTIVITY LIMITATIONS: carrying, lifting, bending, sleeping, transfers, bathing, toileting, dressing, self feeding, reach  over head, and hygiene/grooming  PARTICIPATION LIMITATIONS: meal prep, cleaning, laundry, driving,  shopping, community activity, occupation, and yard work  PERSONAL FACTORS: Time since onset of injury/illness/exacerbation and 3+ comorbidities: anxiety, DM, HTN  are also affecting patient's functional outcome.   REHAB POTENTIAL: Good  CLINICAL DECISION MAKING: Stable/uncomplicated  EVALUATION COMPLEXITY: Low   GOALS: Goals reviewed with patient? No  SHORT TERM GOALS: Target date: 01/06/2023 Pt will demonstrate appropriate understanding and performance of initially prescribed HEP in order to facilitate improved independence with management of symptoms.  Baseline: HEP provided on eval Goal status:MET   2. Pt will score greater than or equal to 45 on FOTO in order to demonstrate improved perception of function due to symptoms.  Baseline: 29  Goal status: MET  LONG TERM GOALS: Target date: 02/17/2023   Pt will score 64 on FOTO in order to demonstrate improved perception of function due to symptoms. Baseline: 29 Goal status: ongoing   2.  Pt will demonstrate at least 150 degrees of active shoulder elevation in order to demonstrate improved tolerance to functional movement patterns such as overhead reaching. Baseline: deferred on eval given acuity of surgery Goal status: MET   3.  Pt will demonstrate at least 4+/5 shoulder flex/abduction MMT for improved symmetry of UE strength and improved tolerance to functional movements.  Baseline: deferred on eval given acuity of surgery Goal status: ongoing   4. Pt will report/demonstrate ability to perform upper body dressing with less than 2 point increase in pain on NPS in order to indicate improved tolerance/independence to ADLs.  Baseline: unable to perform, requiring family assist  Goal status: INITIAL   5. Pt will be able to lift at least 40# from floor to waist with less than 2 pt increase in pain in order to facilitate improved tolerance to work tasks.  Baseline: unable  Goal status: INITIAL  PLAN:  PT FREQUENCY: 2x/week  PT  DURATION: 12 weeks  PLANNED INTERVENTIONS: 97164- PT Re-evaluation, 97110-Therapeutic exercises, 97530- Therapeutic activity, 97112- Neuromuscular re-education, 97535- Self Care, 06269- Manual therapy, 97014- Electrical stimulation (unattended), 97016- Vasopneumatic device, Patient/Family education, Balance training, Stair training, Taping, Dry Needling, Joint mobilization, Spinal mobilization, Scar mobilization, Cryotherapy, and Moist heat  PLAN FOR NEXT SESSION:  Begin Strength 12/31. Progress AAROM to AROM.  Manual therapy, scapular retraction, posture  Karie Mainland, PT 01/14/23 10:46 AM Phone: [phone] Fax: [phone]

## 2023-01-14 ENCOUNTER — Encounter: Payer: Self-pay | Admitting: Physical Therapy

## 2023-01-14 ENCOUNTER — Ambulatory Visit: Payer: 59 | Admitting: Physical Therapy

## 2023-01-14 DIAGNOSIS — M25511 Pain in right shoulder: Secondary | ICD-10-CM

## 2023-01-14 DIAGNOSIS — M25611 Stiffness of right shoulder, not elsewhere classified: Secondary | ICD-10-CM

## 2023-01-14 DIAGNOSIS — M6281 Muscle weakness (generalized): Secondary | ICD-10-CM

## 2023-01-25 ENCOUNTER — Encounter: Payer: Self-pay | Admitting: Physical Therapy

## 2023-01-25 ENCOUNTER — Ambulatory Visit: Payer: 59 | Attending: Orthopaedic Surgery | Admitting: Physical Therapy

## 2023-01-25 DIAGNOSIS — M25511 Pain in right shoulder: Secondary | ICD-10-CM | POA: Insufficient documentation

## 2023-01-25 DIAGNOSIS — M25611 Stiffness of right shoulder, not elsewhere classified: Secondary | ICD-10-CM | POA: Insufficient documentation

## 2023-01-25 DIAGNOSIS — M6281 Muscle weakness (generalized): Secondary | ICD-10-CM | POA: Diagnosis present

## 2023-01-25 NOTE — Therapy (Signed)
 OUTPATIENT PHYSICAL THERAPY SHOULDER TREATMENT   Patient Name: [name] [name] MRN: [id number] DOB:1975/03/24, 48 y.o., male Today's Date: 01/25/2023  END OF SESSION:  PT End of Session - 01/25/23 1021     Visit Number 15    Number of Visits 25    Date for PT Re-Evaluation 02/18/23    Authorization Type [employer]    Authorization Time Period 60VL no auth required    PT Start Time 1005    PT Stop Time 1045    PT Time Calculation (min) 40 min                 Past Medical History:  Diagnosis Date   Anxiety    Diabetes mellitus without complication (HCC)    GERD (gastroesophageal reflux disease)    Hypertension    Obesity (BMI 30-39.9)    Plantar fasciitis of right foot    Skin lesion    Tendonitis    Traumatic tear of left rotator cuff 05/20/2016   Past Surgical History:  Procedure Laterality Date   SHOULDER ACROMIOPLASTY Right 11/23/2022   Procedure: RIGHT SHOULDER ACROMIOPLASTY;  Surgeon: Genelle Standing, MD;  Location: [facility];  Service: Orthopedics;  Laterality: Right;   SHOULDER ARTHROSCOPY WITH ROTATOR CUFF REPAIR AND SUBACROMIAL DECOMPRESSION Left 05/21/2016   Procedure: SHOULDER ARTHROSCOPY WITH ROTATOR CUFF REPAIR AND SUBACROMIAL DECOMPRESSION LABRAL DEBRIDEMENT;  Surgeon: Jane Charleston, MD;  Location: [facility];  Service: Orthopedics;  Laterality: Left;   VASECTOMY     Patient Active Problem List   Diagnosis Date Noted   Traumatic complete tear of right rotator cuff 11/23/2022   Lab test positive for detection of COVID-19 virus 02/23/2022   Paresthesia 10/23/2018   Traumatic tear of left rotator cuff 05/20/2016   Family history of hypertrophic cardiomyopathy August 09, 2014   Family history of sudden cardiac death in son 08-09-2014    PCP: Kip Righter, MD  REFERRING PROVIDER: Genelle Standing, MD  REFERRING DIAG: [phone] (ICD-10-CM) - Traumatic complete tear of right rotator cuff, initial encounter  THERAPY DIAG:  Right  shoulder pain, unspecified chronicity  Stiffness of right shoulder, not elsewhere classified  Muscle weakness (generalized)  Rationale for Evaluation and Treatment: Rehabilitation  ONSET DATE: 11/23/22 rotator cuff repair, superior capsular reconstruction, acromioplasty  SUBJECTIVE:                                                                                                                                                                                      SUBJECTIVE STATEMENT: Sore in the morning improves with dowel exercises. Just soreness , not really pain.    EVAL: In August pt was working on a  ladder, fell backwards but unsure quite how he landed. States he did have a mild concussion but that has since resolved. Previously independent and active for work. Since surgery on 11/23/22, has been receiving assistance from wife and kids for daily tasks. Sleeping in recliner with sling and pillow under arm. Icing 3-4x/day ~35min.  Hand dominance: Right  PERTINENT HISTORY: BIL traumatic RC tears, anxiety, DM, GERD, HTN H/O Lt RCR 2018  Recent note : MD  01/05/23: 6-week status post right shoulder superior capsular reconstruction overall doing very well.  At this time I discussed I would like him to continue through the active range of motion protocol and to work on overhead active range of motion.  Overall he is doing quite well for this stage.  I will plan to see him back in 6 weeks for reassessment   PAIN:  Are you having pain: 0/10 today  Location/description: occasional burning and popping; R shoulder, superiorly Best-worst over past week: 0-7/10  - aggravating factors: sleeping and mornings,  - Easing factors: sling, medication    PRECAUTIONS: s/p R RCR 11/23/22; using Dr. Genelle rotator cuff repair protocol per op note   WEIGHT BEARING RESTRICTIONS: Yes NWB surgical limb  FALLS:  Has patient fallen in last 6 months? Yes. 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 Lifts and carries up to ~50#  PLOF: Independent  PATIENT GOALS: get back to playing golf, shooting pool  NEXT MD VISIT: Nov 18th   OBJECTIVE:  Note: Objective measures were completed at Evaluation unless otherwise noted.  DIAGNOSTIC FINDINGS:  S/p shoulder surgery 11/23/22 Panel 1: Right RIGHT SHOULDER ARTHROSCOPY WITH SUPERIOR CAPSULAR RECONSTRUCTION / EXTENSIVE DEBRIDEMENT with Genelle Standing, MD  Panel 1: Right RIGHT SHOULDER ACROMIOPLASTY with Genelle Standing, MD   PATIENT SURVEYS:  FOTO 29>64 predicted Visit 8 , 12/23/22 : 56%    COGNITION: Overall cognitive status: Within functional limits for tasks assessed     SENSATION/OBSERVATION: Surgical site obscured by bandaging Pt denies any sensory complaints since nerve block wore off  POSTURE: Sling RUE, increased R UT elevation  UPPER EXTREMITY ROM:  ROM Right Eval NT Right 12/01/22 Rt  12/21/22 Rt. 01/04/23 RT 01/10/23 RT 01/25/23  Shoulder flexion  115 AAROM 137 deg  AROM 155 supine Standing 70 deg with shoulder hike   90 deg standing   Shoulder abduction  80 PROM 100 deg  AROM 126 sidelying     Shoulder internal rotation   AROM FR to Rt lumbar L3 Functional reach  L1-2    Shoulder external rotation  18 PROM 45 AROM 40  AROM at 30 deg 47 deg  FR to right top of head    Elbow flexion        Elbow extension        Wrist flexion        Wrist extension         (Blank rows = not tested) (Key: WFL = within functional limits not formally assessed, * = concordant pain, s = stiffness/stretching sensation, NT = not tested)  Comments: deferred given acuity of surgery, phase 1 on eval, elbow flex/ext  AAROM grossly WNL  UPPER EXTREMITY MMT:  MMT Right eval R 12/27/22  Shoulder flexion    Shoulder extension    Shoulder abduction    Shoulder extension    Shoulder internal rotation  5  Shoulder external rotation  4-  Elbow flexion    Elbow extension    Grip strength    (Blank rows = not tested)  (Key: WFL = within functional limits not formally assessed, * = concordant pain, s = stiffness/stretching sensation, NT = not tested)  Comments: deferred given acuity of surgery  SHOULDER SPECIAL TESTS: Deferred given acuity of surgery  JOINT MOBILITY TESTING:  Deferred given acuity of surgery    TREATMENT:          [facility] Adult PT Treatment:                                                DATE: 01/25/23 Therapeutic Exercise: Pulleys for flexion and scaption  Side lying abduction, flexion and horizontal abduction Supine protraction AROM 2 x 10 Supine long lever AROM flexion 2 x 10 Inclined long lever AROM x 10- more difficult  Prone horiz abdct 10 x 2  Prone row 10 x 2  Isometric flex, ER, IR, ext 5 sec x 10 each  GTB row  Manual Therapy: PROM, side lying scap mobs, TPR upper trap/ levator      [facility] Adult PT Treatment:                                                DATE: 01/14/23 Therapeutic Exercise: Pulleys flexion, scaption, internal rotation Standing dowel overhead lift , cross body reaching , abduction, extension  Extension/IR dowel  Red ball rolls 110 deg 1 min each direction Doorway/corner stretch 30 sec  x 5  Supine dowel flexion full ROM  Cactus Arms ER/IR  Manual Therapy: PROM all ranges to tolerance   [facility] Adult PT Treatment:                                                DATE: 01/10/23 Therapeutic Exercise: Pulleys  Standing cane flexion, abdct ER AAROM Standing cane ext and IR AAROM Side lying abdct AROM Sidelying flexion AROM  Sidelying ER AROM  Supine chest press and pullovers  Supine short lever flexion punch  Supine protraction AROM  Supine long  lever AROM flexion x 10 ER stretch in doorway , low  Manual Therapy: PROM all ranges to tolerance      [facility] Adult PT Treatment:                                                DATE: 01/06/23 Therapeutic Exercise: Pulleys Dowel AAROM seated : overhead , cross body reaching Standing extension and IR with Dowel  Standing flexion double arm on the wall  Single arm scaption ball on wall  Supine AAROM flexion AROM goal post x 10 , ER and IR  Supine circles x 10 each direction  Triceps x 15 (skull crusher with arm support)   Prone  extension 2 x 10  Prone horiz. Abd  Prone row (all AROM )   Manual Therapy: PROM all planes in supine and prone to tolerance    [facility] Adult PT Treatment:                                                DATE: 01/04/23 Therapeutic Exercise: Pulleys flexion and scaption 2 min each  Wall slides with foam roller flexion  AAROM foam roller along spine: overhead flexion  Protraction/retraction  AAROM abduction and external rotation Supine scapular retraction  x 10  Rt UE AROM in supine and sidelying Flexion, scaption , ER x 15, punch (flexion) , reverse fly (AROM)  Row x 15 added rotation x 10 green band  Biceps and triceps green band x 15  Manual Therapy: PROM all ranges to tolerance    [facility] Adult PT Treatment:                                                DATE: 12/31/22 Therapeutic Exercise: Supine AAROM with dowel flexion, add/abd ER and chest press  Standing AAROM extension , IR 2  x 10  Seated AAROM abduction, flexion, ER  Standing closed chain shoulder taps , reaches  Lateral high plank walk Wall circle sweeps  Pulleys overhead 140 deg AAROM and scaption  Manual Therapy: Seated inferior glide for GH motion abduction  Teres minor and subscapularis , upper trap pressure point      PATIENT EDUCATION: Education details: Pt education on PT impairments, prognosis, and POC. Informed consent. Rationale for interventions, safe/appropriate HEP  performance Person educated: Patient Education method: Explanation, Demonstration, Tactile cues, Verbal cues, and Handouts Education comprehension: verbalized understanding, returned demonstration, verbal cues required, tactile cues required, and needs further education    HOME EXERCISE PROGRAM:  Access Code: [email or web address] URL: [email or web address] Date: 01/14/2023 Prepared by: Delon Norma  Program Notes - with seated elbow flexion, work on gently straightening and bending the elbow with the support of the other arm  Exercises - Seated Elbow Extension with Self-Anchored Resistance  - 1 x daily - 7 x weekly - 2 sets - 10 reps - 5 hold - Standing Elbow Flexion with Resistance  - 1 x daily - 7 x weekly - 2 sets - 10 reps - 5 hold - Standing Shoulder External Rotation Stretch in Doorway  - 1 x daily - 7 x weekly - 2 sets - 10 reps - 10-20 hold - Standing Shoulder Abduction AAROM with Dowel  - 1 x daily - 7 x weekly - 2 sets - 10 reps - 10 hold - Standing Shoulder Extension with Dowel  - 1 x daily - 7 x weekly - 2 sets - 10 reps - 10 hold - Standing Shoulder Flexion AAROM with Dowel  - 1 x daily - 7 x weekly - 2 sets - 10 reps - 10 hold - Standing Shoulder Internal Rotation AAROM with Dowel  - 1 x daily - 7 x weekly - 2 sets - 10 reps - 10 hold - Seated Scapular Retraction  - 1 x daily - 7 x weekly - 2 sets - 10 reps - 10 hold - Corner Pec Major Stretch  - 1-2 x daily -  7 x weekly - 1 sets - 5 reps - 30 hold Added 4 way isometrics   ASSESSMENT:  CLINICAL IMPRESSION:  Pt reports soreness, not really pain. He is doing well with supine AROM, nearly full for flexion and abduction however in standing he is limited to 90 degrees and below with shoulder hike. Progressed supine AROM flexion to an incline with pt reporting increased challenge but able to complete full ROM. Also initiated isometrics with good tolerance. Updated HEP with isometrics. Will plan to review iso and continued  inclined flexion AROM.     OBJECTIVE IMPAIRMENTS: decreased activity tolerance, decreased endurance, decreased mobility, decreased ROM, decreased strength, impaired perceived functional ability, impaired flexibility, impaired UE functional use, postural dysfunction, and pain.   ACTIVITY LIMITATIONS: carrying, lifting, bending, sleeping, transfers, bathing, toileting, dressing, self feeding, reach over head, and hygiene/grooming  PARTICIPATION LIMITATIONS: meal prep, cleaning, laundry, driving, shopping, community activity, occupation, and yard work  PERSONAL FACTORS: Time since onset of injury/illness/exacerbation and 3+ comorbidities: anxiety, DM, HTN  are also affecting patient's functional outcome.   REHAB POTENTIAL: Good  CLINICAL DECISION MAKING: Stable/uncomplicated  EVALUATION COMPLEXITY: Low   GOALS: Goals reviewed with patient? No  SHORT TERM GOALS: Target date: 01/06/2023 Pt will demonstrate appropriate understanding and performance of initially prescribed HEP in order to facilitate improved independence with management of symptoms.  Baseline: HEP provided on eval Goal status:MET   2. Pt will score greater than or equal to 45 on FOTO in order to demonstrate improved perception of function due to symptoms.  Baseline: 29  Goal status: MET  LONG TERM GOALS: Target date: 02/17/2023   Pt will score 64 on FOTO in order to demonstrate improved perception of function due to symptoms. Baseline: 29 Goal status: ongoing   2.  Pt will demonstrate at least 150 degrees of active shoulder elevation in order to demonstrate improved tolerance to functional movement patterns such as overhead reaching. Baseline: deferred on eval given acuity of surgery Goal status: MET   3.  Pt will demonstrate at least 4+/5 shoulder flex/abduction MMT for improved symmetry of UE strength and improved tolerance to functional movements.  Baseline: deferred on eval given acuity of surgery Goal status:  ongoing   4. Pt will report/demonstrate ability to perform upper body dressing with less than 2 point increase in pain on NPS in order to indicate improved tolerance/independence to ADLs.  Baseline: unable to perform, requiring family assist  Goal status: INITIAL   5. Pt will be able to lift at least 40# from floor to waist with less than 2 pt increase in pain in order to facilitate improved tolerance to work tasks.  Baseline: unable  Goal status: INITIAL  PLAN:  PT FREQUENCY: 2x/week  PT DURATION: 12 weeks  PLANNED INTERVENTIONS: 97164- PT Re-evaluation, 97110-Therapeutic exercises, 97530- Therapeutic activity, 97112- Neuromuscular re-education, 97535- Self Care, 02859- Manual therapy, 97014- Electrical stimulation (unattended), 97016- Vasopneumatic device, Patient/Family education, Balance training, Stair training, Taping, Dry Needling, Joint mobilization, Spinal mobilization, Scar mobilization, Cryotherapy, and Moist heat  PLAN FOR NEXT SESSION:  Begin Strength 12/31. Progress AAROM to AROM.  Manual therapy, scapular retraction, posture  Harlene Persons, PTA 01/25/23 12:17 PM Phone: [phone] Fax: [phone]

## 2023-01-26 NOTE — Therapy (Signed)
 OUTPATIENT PHYSICAL THERAPY SHOULDER TREATMENT   Patient Name: [name] [name] MRN: [id number] DOB:June 23, 1975, 48 y.o., male Today's Date: 01/27/2023  END OF SESSION:  PT End of Session - 01/27/23 1025     Visit Number 16    Number of Visits 25    Date for PT Re-Evaluation 02/18/23    Authorization Type [employer]    Authorization Time Period 60VL no auth required    Progress Note Due on Visit 10    PT Start Time 1020    PT Stop Time 1100    PT Time Calculation (min) 40 min    Activity Tolerance Patient tolerated treatment well    Behavior During Therapy WFL for tasks assessed/performed                  Past Medical History:  Diagnosis Date   Anxiety    Diabetes mellitus without complication (HCC)    GERD (gastroesophageal reflux disease)    Hypertension    Obesity (BMI 30-39.9)    Plantar fasciitis of right foot    Skin lesion    Tendonitis    Traumatic tear of left rotator cuff 05/20/2016   Past Surgical History:  Procedure Laterality Date   SHOULDER ACROMIOPLASTY Right 11/23/2022   Procedure: RIGHT SHOULDER ACROMIOPLASTY;  Surgeon: Genelle Standing, MD;  Location: [facility];  Service: Orthopedics;  Laterality: Right;   SHOULDER ARTHROSCOPY WITH ROTATOR CUFF REPAIR AND SUBACROMIAL DECOMPRESSION Left 05/21/2016   Procedure: SHOULDER ARTHROSCOPY WITH ROTATOR CUFF REPAIR AND SUBACROMIAL DECOMPRESSION LABRAL DEBRIDEMENT;  Surgeon: Jane Charleston, MD;  Location: [facility];  Service: Orthopedics;  Laterality: Left;   VASECTOMY     Patient Active Problem List   Diagnosis Date Noted   Traumatic complete tear of right rotator cuff 11/23/2022   Lab test positive for detection of COVID-19 virus 02/23/2022   Paresthesia 10/23/2018   Traumatic tear of left rotator cuff 05/20/2016   Family history of hypertrophic cardiomyopathy 08/22/2014   Family history of sudden cardiac death in son 08/22/2014    PCP: Kip Righter, MD  REFERRING PROVIDER:  Genelle Standing, MD  REFERRING DIAG: [phone] (ICD-10-CM) - Traumatic complete tear of right rotator cuff, initial encounter  THERAPY DIAG:  Right shoulder pain, unspecified chronicity  Stiffness of right shoulder, not elsewhere classified  Muscle weakness (generalized)  Rationale for Evaluation and Treatment: Rehabilitation  ONSET DATE: 11/23/22 rotator cuff repair, superior capsular reconstruction, acromioplasty  SUBJECTIVE:  SUBJECTIVE STATEMENT: No pain at all.     EVAL: In August pt was working on a ladder, fell backwards but unsure quite how he landed. States he did have a mild concussion but that has since resolved. Previously independent and active for work. Since surgery on 11/23/22, has been receiving assistance from wife and kids for daily tasks. Sleeping in recliner with sling and pillow under arm. Icing 3-4x/day ~42min.  Hand dominance: Right  PERTINENT HISTORY: BIL traumatic RC tears, anxiety, DM, GERD, HTN H/O Lt RCR 2018  Recent note : MD  01/05/23: 6-week status post right shoulder superior capsular reconstruction overall doing very well.  At this time I discussed I would like him to continue through the active range of motion protocol and to work on overhead active range of motion.  Overall he is doing quite well for this stage.  I will plan to see him back in 6 weeks for reassessment   PAIN:  Are you having pain: 0/10 today  Location/description: occasional burning and popping; R shoulder, superiorly Best-worst over past week: 0-7/10  - aggravating factors: sleeping and mornings,  - Easing factors: sling, medication    PRECAUTIONS: s/p R RCR 11/23/22; using Dr. Genelle rotator cuff repair protocol per op note   WEIGHT BEARING RESTRICTIONS: Yes NWB surgical limb  FALLS:  Has patient  fallen in last 6 months? Yes. 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 Lifts and carries up to ~50#  PLOF: Independent  PATIENT GOALS: get back to playing golf, shooting pool  NEXT MD VISIT: Nov 18th   OBJECTIVE:  Note: Objective measures were completed at Evaluation unless otherwise noted.  DIAGNOSTIC FINDINGS:  S/p shoulder surgery 11/23/22 Panel 1: Right RIGHT SHOULDER ARTHROSCOPY WITH SUPERIOR CAPSULAR RECONSTRUCTION / EXTENSIVE DEBRIDEMENT with Genelle Standing, MD  Panel 1: Right RIGHT SHOULDER ACROMIOPLASTY with Genelle Standing, MD   PATIENT SURVEYS:  FOTO 29>64 predicted Visit 8 , 12/23/22 : 56%  Visit 16: 01/27/23:    COGNITION: Overall cognitive status: Within functional limits for tasks assessed     SENSATION/OBSERVATION: Surgical site obscured by bandaging Pt denies any sensory complaints since nerve block wore off  POSTURE: Sling RUE, increased R UT elevation  UPPER EXTREMITY ROM:  ROM Right Eval NT Right 12/01/22 Rt  12/21/22 Rt. 01/04/23 RT 01/10/23 RT 01/25/23  Shoulder flexion  115 AAROM 137 deg  AROM 155 supine Standing 70 deg with shoulder hike   90 deg standing   Shoulder abduction  80 PROM 100 deg  AROM 126 sidelying     Shoulder internal rotation   AROM FR to Rt lumbar L3 Functional reach  L1-2    Shoulder external rotation  18 PROM 45 AROM 40  AROM at 30 deg 47 deg  FR to right top of head    Elbow flexion        Elbow extension        Wrist flexion        Wrist extension         (Blank rows = not tested) (Key: WFL = within functional limits not formally assessed, * = concordant pain, s = stiffness/stretching  sensation, NT = not tested)  Comments: deferred given acuity  of surgery, phase 1 on eval, elbow flex/ext AAROM grossly WNL  UPPER EXTREMITY MMT:  MMT Right eval R 12/27/22  Rt. 01/27/23  Shoulder flexion   3-/5  Shoulder extension     Shoulder abduction   3-/5  Shoulder extension     Shoulder internal rotation  5 5/5  Shoulder external rotation  4- 4/5  Elbow flexion     Elbow extension     Grip strength     (Blank rows = not tested)  (Key: WFL = within functional limits not formally assessed, * = concordant pain, s = stiffness/stretching sensation, NT = not tested)  Comments: deferred given acuity of surgery  SHOULDER SPECIAL TESTS: Deferred given acuity of surgery  JOINT MOBILITY TESTING:  Deferred given acuity of surgery    TREATMENT:           [facility] Adult PT Treatment:                                                DATE: 01/27/23 Therapeutic Exercise: UBE 5 min L 1 for 5 min  Supine chest press 4 lbs dowel Supine 0-140 with weight on dowel x 15, then 0-90 deg x 10  Scapular protraction/retraction  4 lbs dowel 2 x 10  Isometric flex, ER, IR, ext 5 sec x 10 each, done with PT x 5 first then standing near wall  Added longer lever flexion/abduction  AAROM wall slides flexion , unable to do abduction  Seated chest press green band with lats active x 2 x 10  Standing black band retraction  x 15  Standing adduction green band x 15  Manual Therapy: PROM all planes  [facility] Adult PT Treatment:                                                DATE: 01/25/23 Therapeutic Exercise: Pulleys for flexion and scaption  Side lying abduction, flexion and horizontal abduction Supine protraction AROM 2 x 10 Supine long lever AROM flexion 2 x 10 Inclined long lever AROM x 10- more difficult  Prone horiz abdct 10 x 2  Prone row 10 x 2  Isometric flex, ER, IR, ext 5 sec x 10 each  GTB row  Manual Therapy: PROM, side lying scap mobs, TPR upper trap/ levator      [facility] Adult PT Treatment:                                                 DATE: 01/14/23 Therapeutic Exercise: Pulleys flexion, scaption, internal rotation Standing dowel overhead lift , cross body reaching , abduction, extension  Extension/IR dowel  Red ball rolls 110 deg 1 min each direction Doorway/corner stretch 30 sec  x 5  Supine dowel flexion full ROM  Cactus Arms ER/IR  Manual Therapy: PROM all ranges to tolerance   [facility] Adult PT Treatment:  DATE: 01/10/23 Therapeutic Exercise: Pulleys  Standing cane flexion, abdct ER AAROM Standing cane ext and IR AAROM Side lying abdct AROM Sidelying flexion AROM  Sidelying ER AROM  Supine chest press and pullovers  Supine short lever flexion punch  Supine protraction AROM  Supine long lever AROM flexion x 10 ER stretch in doorway , low  Manual Therapy: PROM all ranges to tolerance      [facility] Adult PT Treatment:                                                DATE: 01/06/23 Therapeutic Exercise: Pulleys Dowel AAROM seated : overhead , cross body reaching Standing extension and IR with Dowel  Standing flexion double arm on the wall  Single arm scaption ball on wall  Supine AAROM flexion AROM goal post x 10 , ER and IR  Supine circles x 10 each direction  Triceps x 15 (skull crusher with arm support)   Prone extension 2 x 10  Prone horiz. Abd  Prone row (all AROM )   Manual Therapy: PROM all planes in supine and prone to tolerance    [facility] Adult PT Treatment:                                                DATE: 01/04/23 Therapeutic Exercise: Pulleys flexion and scaption 2 min each  Wall slides with foam roller flexion  AAROM foam roller along spine: overhead flexion  Protraction/retraction  AAROM abduction and external rotation Supine scapular retraction  x 10  Rt UE AROM in supine and sidelying Flexion, scaption , ER x 15, punch (flexion) , reverse fly (AROM)  Row x 15 added rotation x 10 green band   Biceps and triceps green band x 15  Manual Therapy: PROM all ranges to tolerance    [facility] Adult PT Treatment:                                                DATE: 12/31/22 Therapeutic Exercise: Supine AAROM with dowel flexion, add/abd ER and chest press  Standing AAROM extension , IR 2  x 10  Seated AAROM abduction, flexion, ER  Standing closed chain shoulder taps , reaches  Lateral high plank walk Wall circle sweeps  Pulleys overhead 140 deg AAROM and scaption  Manual Therapy: Seated inferior glide for GH motion abduction  Teres minor and subscapularis , upper trap pressure point      PATIENT EDUCATION: Education details: Pt education on PT impairments, prognosis, and POC. Informed consent. Rationale for interventions, safe/appropriate HEP performance Person educated: Patient Education method: Explanation, Demonstration, Tactile cues, Verbal cues, and Handouts Education comprehension: verbalized understanding, returned demonstration, verbal cues required, tactile cues required, and needs further education    HOME EXERCISE PROGRAM:  Access Code: [email or web address] URL: [email or web address] Date: 01/14/2023 Prepared by: Delon Norma  Program Notes - with seated elbow flexion, work on gently straightening and bending the elbow with the support of the other arm  Exercises - Seated Elbow Extension with Self-Anchored Resistance  - 1 x daily - 7 x weekly -  2 sets - 10 reps - 5 hold - Standing Elbow Flexion with Resistance  - 1 x daily - 7 x weekly - 2 sets - 10 reps - 5 hold - Standing Shoulder External Rotation Stretch in Doorway  - 1 x daily - 7 x weekly - 2 sets - 10 reps - 10-20 hold - Standing Shoulder Abduction AAROM with Dowel  - 1 x daily - 7 x weekly - 2 sets - 10 reps - 10 hold - Standing Shoulder Extension with Dowel  - 1 x daily - 7 x weekly - 2 sets - 10 reps - 10 hold - Standing Shoulder Flexion AAROM with Dowel  - 1 x daily - 7 x weekly - 2 sets - 10 reps -  10 hold - Standing Shoulder Internal Rotation AAROM with Dowel  - 1 x daily - 7 x weekly - 2 sets - 10 reps - 10 hold - Seated Scapular Retraction  - 1 x daily - 7 x weekly - 2 sets - 10 reps - 10 hold - Corner Pec Major Stretch  - 1-2 x daily - 7 x weekly - 1 sets - 5 reps - 30 hold Added 4 way isometrics   ASSESSMENT:  CLINICAL IMPRESSION:   Patient continues to demonstrate heavy compensations with scapula with shoulder flexion and abduction.  His pain is well controlled and is even sleeping on his Rt shoulder. Strength in Rt shoulder flexion and abduction is 3-/5.  Cont with POC in phase of AROM and light strength.    Pt reports soreness, not really pain. He is doing well with supine AROM, nearly full for flexion and abduction however in standing he is limited to 90 degrees and below with shoulder hike. Progressed supine AROM flexion to an incline with pt reporting increased challenge but able to complete full ROM. Also initiated isometrics with good tolerance. Updated HEP with isometrics. Will plan to review iso and continued inclined flexion AROM.     OBJECTIVE IMPAIRMENTS: decreased activity tolerance, decreased endurance, decreased mobility, decreased ROM, decreased strength, impaired perceived functional ability, impaired flexibility, impaired UE functional use, postural dysfunction, and pain.   ACTIVITY LIMITATIONS: carrying, lifting, bending, sleeping, transfers, bathing, toileting, dressing, self feeding, reach over head, and hygiene/grooming  PARTICIPATION LIMITATIONS: meal prep, cleaning, laundry, driving, shopping, community activity, occupation, and yard work  PERSONAL FACTORS: Time since onset of injury/illness/exacerbation and 3+ comorbidities: anxiety, DM, HTN  are also affecting patient's functional outcome.   REHAB POTENTIAL: Good  CLINICAL DECISION MAKING: Stable/uncomplicated  EVALUATION COMPLEXITY: Low   GOALS: Goals reviewed with patient? No  SHORT TERM  GOALS: Target date: 01/06/2023 Pt will demonstrate appropriate understanding and performance of initially prescribed HEP in order to facilitate improved independence with management of symptoms.  Baseline: HEP provided on eval Goal status:MET   2. Pt will score greater than or equal to 45 on FOTO in order to demonstrate improved perception of function due to symptoms.  Baseline: 29  Goal status: MET  LONG TERM GOALS: Target date: 02/17/2023   Pt will score 64 on FOTO in order to demonstrate improved perception of function due to symptoms. Baseline: 29 Goal status: ongoing   2.  Pt will demonstrate at least 150 degrees of active shoulder elevation in order to demonstrate improved tolerance to functional movement patterns such as overhead reaching. Baseline: deferred on eval given acuity of surgery Goal status: MET   3.  Pt will demonstrate at least 4+/5 shoulder flex/abduction MMT for improved  symmetry of UE strength and improved tolerance to functional movements.  Baseline: deferred on eval given acuity of surgery Goal status: ongoing   4. Pt will report/demonstrate ability to perform upper body dressing with less than 2 point increase in pain on NPS in order to indicate improved tolerance/independence to ADLs.  Baseline: unable to perform, requiring family assist  Goal status: INITIAL   5. Pt will be able to lift at least 40# from floor to waist with less than 2 pt increase in pain in order to facilitate improved tolerance to work tasks.  Baseline: unable  Goal status: INITIAL  PLAN:  PT FREQUENCY: 2x/week  PT DURATION: 12 weeks  PLANNED INTERVENTIONS: 97164- PT Re-evaluation, 97110-Therapeutic exercises, 97530- Therapeutic activity, 97112- Neuromuscular re-education, 97535- Self Care, [id number]- Manual therapy, 97014- Electrical stimulation (unattended), 97016- Vasopneumatic device, Patient/Family education, Balance training, Stair training, Taping, Dry Needling, Joint mobilization,  Spinal mobilization, Scar mobilization, Cryotherapy, and Moist heat  PLAN FOR NEXT SESSION:  Begin Strength 12/31. Progress AAROM to AROM.  Manual therapy, scapular retraction, posture  Delon Norma, PT 01/27/23 12:09 PM Phone: [phone] Fax: [phone]

## 2023-01-27 ENCOUNTER — Ambulatory Visit: Payer: 59 | Admitting: Physical Therapy

## 2023-01-27 ENCOUNTER — Encounter: Payer: Self-pay | Admitting: Physical Therapy

## 2023-01-27 DIAGNOSIS — M25511 Pain in right shoulder: Secondary | ICD-10-CM

## 2023-01-27 DIAGNOSIS — M6281 Muscle weakness (generalized): Secondary | ICD-10-CM

## 2023-01-27 DIAGNOSIS — M25611 Stiffness of right shoulder, not elsewhere classified: Secondary | ICD-10-CM

## 2023-02-01 ENCOUNTER — Encounter: Payer: Self-pay | Admitting: Physical Therapy

## 2023-02-01 ENCOUNTER — Ambulatory Visit: Payer: 59 | Admitting: Physical Therapy

## 2023-02-01 DIAGNOSIS — M25511 Pain in right shoulder: Secondary | ICD-10-CM | POA: Diagnosis not present

## 2023-02-01 DIAGNOSIS — M25611 Stiffness of right shoulder, not elsewhere classified: Secondary | ICD-10-CM

## 2023-02-01 DIAGNOSIS — M6281 Muscle weakness (generalized): Secondary | ICD-10-CM

## 2023-02-01 NOTE — Therapy (Signed)
 OUTPATIENT PHYSICAL THERAPY SHOULDER TREATMENT   Patient Name: [name] [name] MRN: [id number] DOB:10-31-1975, 48 y.o., male Today's Date: 02/01/2023  END OF SESSION:  PT End of Session - 02/01/23 1037     Visit Number 17    Number of Visits 25    Date for PT Re-Evaluation 02/18/23    Authorization Type [employer]    Authorization Time Period 60VL no auth required    PT Start Time 1032   pt late   PT Stop Time 1100    PT Time Calculation (min) 28 min                  Past Medical History:  Diagnosis Date   Anxiety    Diabetes mellitus without complication (HCC)    GERD (gastroesophageal reflux disease)    Hypertension    Obesity (BMI 30-39.9)    Plantar fasciitis of right foot    Skin lesion    Tendonitis    Traumatic tear of left rotator cuff 05/20/2016   Past Surgical History:  Procedure Laterality Date   SHOULDER ACROMIOPLASTY Right 11/23/2022   Procedure: RIGHT SHOULDER ACROMIOPLASTY;  Surgeon: Genelle Standing, MD;  Location: [facility];  Service: Orthopedics;  Laterality: Right;   SHOULDER ARTHROSCOPY WITH ROTATOR CUFF REPAIR AND SUBACROMIAL DECOMPRESSION Left 05/21/2016   Procedure: SHOULDER ARTHROSCOPY WITH ROTATOR CUFF REPAIR AND SUBACROMIAL DECOMPRESSION LABRAL DEBRIDEMENT;  Surgeon: Jane Charleston, MD;  Location: [facility];  Service: Orthopedics;  Laterality: Left;   VASECTOMY     Patient Active Problem List   Diagnosis Date Noted   Traumatic complete tear of right rotator cuff 11/23/2022   Lab test positive for detection of COVID-19 virus 02/23/2022   Paresthesia 10/23/2018   Traumatic tear of left rotator cuff 05/20/2016   Family history of hypertrophic cardiomyopathy August 08, 2014   Family history of sudden cardiac death in son August 08, 2014    PCP: Kip Righter, MD  REFERRING PROVIDER: Genelle Standing, MD  REFERRING DIAG: [phone] (ICD-10-CM) - Traumatic complete tear of right rotator cuff, initial encounter  THERAPY  DIAG:  Right shoulder pain, unspecified chronicity  Stiffness of right shoulder, not elsewhere classified  Muscle weakness (generalized)  Rationale for Evaluation and Treatment: Rehabilitation  ONSET DATE: 11/23/22 rotator cuff repair, superior capsular reconstruction, acromioplasty  SUBJECTIVE:                                                                                                                                                                                      SUBJECTIVE STATEMENT: Sore from the weekend of snow. I didn't ride or drive anything.    EVAL: In August pt was working  on a ladder, fell backwards but unsure quite how he landed. States he did have a mild concussion but that has since resolved. Previously independent and active for work. Since surgery on 11/23/22, has been receiving assistance from wife and kids for daily tasks. Sleeping in recliner with sling and pillow under arm. Icing 3-4x/day ~40min.  Hand dominance: Right  PERTINENT HISTORY: BIL traumatic RC tears, anxiety, DM, GERD, HTN H/O Lt RCR 2018  Recent note : MD  01/05/23: 6-week status post right shoulder superior capsular reconstruction overall doing very well.  At this time I discussed I would like him to continue through the active range of motion protocol and to work on overhead active range of motion.  Overall he is doing quite well for this stage.  I will plan to see him back in 6 weeks for reassessment   PAIN:  Are you having pain: 0/10 today  Location/description: occasional burning and popping; R shoulder, superiorly Best-worst over past week: 0-7/10  - aggravating factors: sleeping and mornings,  - Easing factors: sling, medication    PRECAUTIONS: s/p R RCR 11/23/22; using Dr. Genelle rotator cuff repair protocol per op note   WEIGHT BEARING RESTRICTIONS: Yes NWB surgical limb  FALLS:  Has patient fallen in last 6 months? Yes. 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 Lifts and carries up to ~50#  PLOF: Independent  PATIENT GOALS: get back to playing golf, shooting pool  NEXT MD VISIT: Nov 18th   OBJECTIVE:  Note: Objective measures were completed at Evaluation unless otherwise noted.  DIAGNOSTIC FINDINGS:  S/p shoulder surgery 11/23/22 Panel 1: Right RIGHT SHOULDER ARTHROSCOPY WITH SUPERIOR CAPSULAR RECONSTRUCTION / EXTENSIVE DEBRIDEMENT with Genelle Standing, MD  Panel 1: Right RIGHT SHOULDER ACROMIOPLASTY with Genelle Standing, MD   PATIENT SURVEYS:  FOTO 29>64 predicted Visit 8 , 12/23/22 : 56%  Visit 16: 01/27/23:    COGNITION: Overall cognitive status: Within functional limits for tasks assessed     SENSATION/OBSERVATION: Surgical site obscured by bandaging Pt denies any sensory complaints since nerve block wore off  POSTURE: Sling RUE, increased R UT elevation  UPPER EXTREMITY ROM:  ROM Right Eval NT Right 12/01/22 Rt  12/21/22 Rt. 01/04/23 RT 01/10/23 RT 01/25/23 RT  02/01/23  Shoulder flexion  115 AAROM 137 deg  AROM 155 supine Standing 70 deg with shoulder hike   90 deg standing  90 with compensations  Shoulder abduction  80 PROM 100 deg  AROM 126 sidelying      Shoulder internal rotation   AROM FR to Rt lumbar L3 Functional reach  L1-2     Shoulder external rotation  18 PROM 45 AROM 40  AROM at 30 deg 47 deg  FR to right top of head     Elbow flexion         Elbow extension         Wrist flexion         Wrist extension          (Blank rows = not tested) (Key: WFL = within functional limits not formally assessed, * = concordant pain, s = stiffness/stretching sensation, NT = not tested)  Comments:  deferred given acuity of surgery, phase  1 on eval, elbow flex/ext AAROM grossly WNL  UPPER EXTREMITY MMT:  MMT Right eval R 12/27/22  Rt. 01/27/23  Shoulder flexion   3-/5  Shoulder extension     Shoulder abduction   3-/5  Shoulder extension     Shoulder internal rotation  5 5/5  Shoulder external rotation  4- 4/5  Elbow flexion     Elbow extension     Grip strength     (Blank rows = not tested)  (Key: WFL = within functional limits not formally assessed, * = concordant pain, s = stiffness/stretching sensation, NT = not tested)  Comments: deferred given acuity of surgery  SHOULDER SPECIAL TESTS: Deferred given acuity of surgery  JOINT MOBILITY TESTING:  Deferred given acuity of surgery    TREATMENT:          [facility] Adult PT Treatment:                                                DATE: 02/01/23 Therapeutic Exercise: Pulleys flexion and scaption Supine Shoulder flexion x 10 50 deg inclined shoulder flexion 10 x 2  Supine shoulder iso IR and ER with manual resist from PTA S/L shoulder abduction AROM x 10, 1# x10 S/L shoulder flexion AROM x 10, 1# x 10  S/L shoulder ER AROM x 10 , 1# x 10 Supine 1# protraction x 10  Supine rhythmic stabilization at 90  Manual Therapy: PROM all planes      [facility] Adult PT Treatment:                                                DATE: 01/27/23 Therapeutic Exercise: UBE 5 min L 1 for 5 min  Supine chest press 4 lbs dowel Supine 0-140 with weight on dowel x 15, then 0-90 deg x 10  Scapular protraction/retraction  4 lbs dowel 2 x 10  Isometric flex, ER, IR, ext 5 sec x 10 each, done with PT x 5 first then standing near wall  Added longer lever flexion/abduction  AAROM wall slides flexion , unable to do abduction  Seated chest press green band with lats active x 2 x 10  Standing black band retraction  x 15  Standing adduction green band x 15  Manual Therapy: PROM all planes  [facility] Adult PT Treatment:                                                 DATE: 01/25/23 Therapeutic Exercise: Pulleys for flexion and scaption  Side lying abduction, flexion and horizontal abduction Supine protraction AROM 2 x 10 Supine long lever AROM flexion 2 x 10 Inclined long lever AROM x 10- more difficult  Prone horiz abdct 10 x 2  Prone row 10 x 2  Isometric flex, ER, IR, ext 5 sec x 10 each  GTB row  Manual Therapy: PROM, side lying scap mobs, TPR upper trap/ levator      [facility] Adult PT Treatment:  DATE: 01/14/23 Therapeutic Exercise: Pulleys flexion, scaption, internal rotation Standing dowel overhead lift , cross body reaching , abduction, extension  Extension/IR dowel  Red ball rolls 110 deg 1 min each direction Doorway/corner stretch 30 sec  x 5  Supine dowel flexion full ROM  Cactus Arms ER/IR  Manual Therapy: PROM all ranges to tolerance   [facility] Adult PT Treatment:                                                DATE: 01/10/23 Therapeutic Exercise: Pulleys  Standing cane flexion, abdct ER AAROM Standing cane ext and IR AAROM Side lying abdct AROM Sidelying flexion AROM  Sidelying ER AROM  Supine chest press and pullovers  Supine short lever flexion punch  Supine protraction AROM  Supine long lever AROM flexion x 10 ER stretch in doorway , low  Manual Therapy: PROM all ranges to tolerance      [facility] Adult PT Treatment:                                                DATE: 01/06/23 Therapeutic Exercise: Pulleys Dowel AAROM seated : overhead , cross body reaching Standing extension and IR with Dowel  Standing flexion double arm on the wall  Single arm scaption ball on wall  Supine AAROM flexion AROM goal post x 10 , ER and IR  Supine circles x 10 each direction  Triceps x 15 (skull crusher with arm support)   Prone extension 2 x 10  Prone horiz. Abd  Prone row (all AROM )   Manual Therapy: PROM all planes in supine and prone to tolerance    [facility] Adult PT Treatment:                                                 DATE: 01/04/23 Therapeutic Exercise: Pulleys flexion and scaption 2 min each  Wall slides with foam roller flexion  AAROM foam roller along spine: overhead flexion  Protraction/retraction  AAROM abduction and external rotation Supine scapular retraction  x 10  Rt UE AROM in supine and sidelying Flexion, scaption , ER x 15, punch (flexion) , reverse fly (AROM)  Row x 15 added rotation x 10 green band  Biceps and triceps green band x 15  Manual Therapy: PROM all ranges to tolerance    [facility] Adult PT Treatment:                                                DATE: 12/31/22 Therapeutic Exercise: Supine AAROM with dowel flexion, add/abd ER and chest press  Standing AAROM extension , IR 2  x 10  Seated AAROM abduction, flexion, ER  Standing closed chain shoulder taps , reaches  Lateral high plank walk Wall circle sweeps  Pulleys overhead 140 deg AAROM and scaption  Manual Therapy: Seated inferior glide for GH motion abduction  Teres minor and subscapularis , upper trap pressure point  PATIENT EDUCATION: Education details: Pt education on PT impairments, prognosis, and POC. Informed consent. Rationale for interventions, safe/appropriate HEP performance Person educated: Patient Education method: Explanation, Demonstration, Tactile cues, Verbal cues, and Handouts Education comprehension: verbalized understanding, returned demonstration, verbal cues required, tactile cues required, and needs further education    HOME EXERCISE PROGRAM:  Access Code: [email or web address] URL: [email or web address] Date: 01/14/2023 Prepared by: Delon Norma  Program Notes - with seated elbow flexion, work on gently straightening and bending the elbow with the support of the other arm  Exercises - Seated Elbow Extension with Self-Anchored Resistance  - 1 x daily - 7 x weekly - 2 sets - 10 reps - 5 hold - Standing Elbow Flexion with Resistance  - 1  x daily - 7 x weekly - 2 sets - 10 reps - 5 hold - Standing Shoulder External Rotation Stretch in Doorway  - 1 x daily - 7 x weekly - 2 sets - 10 reps - 10-20 hold - Standing Shoulder Abduction AAROM with Dowel  - 1 x daily - 7 x weekly - 2 sets - 10 reps - 10 hold - Standing Shoulder Extension with Dowel  - 1 x daily - 7 x weekly - 2 sets - 10 reps - 10 hold - Standing Shoulder Flexion AAROM with Dowel  - 1 x daily - 7 x weekly - 2 sets - 10 reps - 10 hold - Standing Shoulder Internal Rotation AAROM with Dowel  - 1 x daily - 7 x weekly - 2 sets - 10 reps - 10 hold - Seated Scapular Retraction  - 1 x daily - 7 x weekly - 2 sets - 10 reps - 10 hold - Corner Pec Major Stretch  - 1-2 x daily - 7 x weekly - 1 sets - 5 reps - 30 hold Added 4 way isometrics   ASSESSMENT:  CLINICAL IMPRESSION:  Pt a little late today. Continued with supine and reclined shoulder strengthening. Patient continues to demonstrate heavy compensations with scapula with shoulder flexion and abduction.   Cont with POC in phase of AROM and light strength.    Pt reports soreness, not really pain. He is doing well with supine AROM, nearly full for flexion and abduction however in standing he is limited to 90 degrees and below with shoulder hike. Progressed supine AROM flexion to an incline with pt reporting increased challenge but able to complete full ROM. Also initiated isometrics with good tolerance. Updated HEP with isometrics. Will plan to review iso and continued inclined flexion AROM.     OBJECTIVE IMPAIRMENTS: decreased activity tolerance, decreased endurance, decreased mobility, decreased ROM, decreased strength, impaired perceived functional ability, impaired flexibility, impaired UE functional use, postural dysfunction, and pain.   ACTIVITY LIMITATIONS: carrying, lifting, bending, sleeping, transfers, bathing, toileting, dressing, self feeding, reach over head, and hygiene/grooming  PARTICIPATION LIMITATIONS: meal  prep, cleaning, laundry, driving, shopping, community activity, occupation, and yard work  PERSONAL FACTORS: Time since onset of injury/illness/exacerbation and 3+ comorbidities: anxiety, DM, HTN  are also affecting patient's functional outcome.   REHAB POTENTIAL: Good  CLINICAL DECISION MAKING: Stable/uncomplicated  EVALUATION COMPLEXITY: Low   GOALS: Goals reviewed with patient? No  SHORT TERM GOALS: Target date: 01/06/2023 Pt will demonstrate appropriate understanding and performance of initially prescribed HEP in order to facilitate improved independence with management of symptoms.  Baseline: HEP provided on eval Goal status:MET   2. Pt will score greater than or equal to 45 on FOTO in order to demonstrate improved perception  of function due to symptoms.  Baseline: 29  Goal status: MET  LONG TERM GOALS: Target date: 02/17/2023   Pt will score 64 on FOTO in order to demonstrate improved perception of function due to symptoms. Baseline: 29 Goal status: ongoing   2.  Pt will demonstrate at least 150 degrees of active shoulder elevation in order to demonstrate improved tolerance to functional movement patterns such as overhead reaching. Baseline: deferred on eval given acuity of surgery Goal status: MET   3.  Pt will demonstrate at least 4+/5 shoulder flex/abduction MMT for improved symmetry of UE strength and improved tolerance to functional movements.  Baseline: deferred on eval given acuity of surgery Goal status: ongoing   4. Pt will report/demonstrate ability to perform upper body dressing with less than 2 point increase in pain on NPS in order to indicate improved tolerance/independence to ADLs.  Baseline: unable to perform, requiring family assist  Goal status: INITIAL   5. Pt will be able to lift at least 40# from floor to waist with less than 2 pt increase in pain in order to facilitate improved tolerance to work tasks.  Baseline: unable  Goal status:  INITIAL  PLAN:  PT FREQUENCY: 2x/week  PT DURATION: 12 weeks  PLANNED INTERVENTIONS: 97164- PT Re-evaluation, 97110-Therapeutic exercises, 97530- Therapeutic activity, 97112- Neuromuscular re-education, 97535- Self Care, [id number]- Manual therapy, 97014- Electrical stimulation (unattended), 97016- Vasopneumatic device, Patient/Family education, Balance training, Stair training, Taping, Dry Needling, Joint mobilization, Spinal mobilization, Scar mobilization, Cryotherapy, and Moist heat  PLAN FOR NEXT SESSION:  Begin Strength 12/31. Progress AAROM to AROM.  Manual therapy, scapular retraction, posture : STRENGTHEN IN SUPINE AND RECLINED, PROGRESSING TO UPRIGHT.   Delon Norma, PT 02/01/23 12:45 PM Phone: [phone] Fax: [phone]

## 2023-02-03 ENCOUNTER — Encounter: Payer: Self-pay | Admitting: Physical Therapy

## 2023-02-03 ENCOUNTER — Ambulatory Visit: Payer: 59 | Admitting: Physical Therapy

## 2023-02-03 DIAGNOSIS — M6281 Muscle weakness (generalized): Secondary | ICD-10-CM

## 2023-02-03 DIAGNOSIS — M25511 Pain in right shoulder: Secondary | ICD-10-CM

## 2023-02-03 DIAGNOSIS — M25611 Stiffness of right shoulder, not elsewhere classified: Secondary | ICD-10-CM

## 2023-02-03 NOTE — Therapy (Signed)
OUTPATIENT PHYSICAL THERAPY SHOULDER TREATMENT   Patient Name: [name] [name] MRN: [id number] DOB:[birth year], 48 y.o., male Today's Date: 02/03/2023  END OF SESSION:  PT End of Session - 02/03/23 1026     Visit Number 18    Number of Visits 25    Date for PT Re-Evaluation 02/18/23    Authorization Type [employer]    Authorization Time Period 60VL no auth required    PT Start Time 1025   10 min late   PT Stop Time 1058    PT Time Calculation (min) 33 min                   Past Medical History:  Diagnosis Date   Anxiety    Diabetes mellitus without complication (HCC)    GERD (gastroesophageal reflux disease)    Hypertension    Obesity (BMI 30-39.9)    Plantar fasciitis of right foot    Skin lesion    Tendonitis    Traumatic tear of left rotator cuff 05/20/2016   Past Surgical History:  Procedure Laterality Date   SHOULDER ACROMIOPLASTY Right 11/23/2022   Procedure: RIGHT SHOULDER ACROMIOPLASTY;  Surgeon: Huel Cote, MD;  Location: [facility];  Service: Orthopedics;  Laterality: Right;   SHOULDER ARTHROSCOPY WITH ROTATOR CUFF REPAIR AND SUBACROMIAL DECOMPRESSION Left 05/21/2016   Procedure: SHOULDER ARTHROSCOPY WITH ROTATOR CUFF REPAIR AND SUBACROMIAL DECOMPRESSION LABRAL DEBRIDEMENT;  Surgeon: Salvatore Marvel, MD;  Location:  SURGERY CENTER;  Service: Orthopedics;  Laterality: Left;   VASECTOMY     Patient Active Problem List   Diagnosis Date Noted   Traumatic complete tear of right rotator cuff 11/23/2022   Lab test positive for detection of COVID-19 virus 02/23/2022   Paresthesia 10/23/2018   Traumatic tear of left rotator cuff 05/20/2016   Family history of hypertrophic cardiomyopathy 2014/08/03   Family history of sudden cardiac death in son 08-03-2014    PCP: Farris Has, MD  REFERRING PROVIDER: Huel Cote, MD  REFERRING DIAG: [phone] (ICD-10-CM) - Traumatic complete tear of right rotator cuff, initial  encounter  THERAPY DIAG:  Right shoulder pain, unspecified chronicity  Stiffness of right shoulder, not elsewhere classified  Muscle weakness (generalized)  Rationale for Evaluation and Treatment: Rehabilitation  ONSET DATE: 11/23/22 rotator cuff repair, superior capsular reconstruction, acromioplasty  SUBJECTIVE:                                                                                                                                                                                      SUBJECTIVE STATEMENT: Yesterday I was having stabbing pains in top of shoulder all day,  but it hasn't happened today.  EVAL: In August pt was working on a ladder, fell backwards but unsure quite how he landed. States he did have a mild concussion but that has since resolved. Previously independent and active for work. Since surgery on 11/23/22, has been receiving assistance from wife and kids for daily tasks. Sleeping in recliner with sling and pillow under arm. Icing 3-4x/day ~12min.  Hand dominance: Right  PERTINENT HISTORY: BIL traumatic RC tears, anxiety, DM, GERD, HTN H/O Lt RCR 2018  Recent note : MD  01/05/23: 6-week status post right shoulder superior capsular reconstruction overall doing very well.  At this time I discussed I would like him to continue through the active range of motion protocol and to work on overhead active range of motion.  Overall he is doing quite well for this stage.  I will plan to see him back in 6 weeks for reassessment   PAIN:  Are you having pain: 0/10 today  Location/description: occasional burning and popping; R shoulder, superiorly Best-worst over past week: 0-7/10  - aggravating factors: sleeping and mornings,  - Easing factors: sling, medication    PRECAUTIONS: s/p R RCR 11/23/22; using Dr. Steward Drone rotator cuff repair protocol per op note   WEIGHT BEARING RESTRICTIONS: Yes NWB surgical limb  FALLS:  Has patient fallen in last 6 months? Yes. 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 Lifts and carries up to ~50#  PLOF: Independent  PATIENT GOALS: get back to playing golf, shooting pool  NEXT MD VISIT: Nov 18th   OBJECTIVE:  Note: Objective measures were completed at Evaluation unless otherwise noted.  DIAGNOSTIC FINDINGS:  S/p shoulder surgery 11/23/22 Panel 1: Right RIGHT SHOULDER ARTHROSCOPY WITH SUPERIOR CAPSULAR RECONSTRUCTION / EXTENSIVE DEBRIDEMENT with Huel Cote, MD  Panel 1: Right RIGHT SHOULDER ACROMIOPLASTY with Huel Cote, MD   PATIENT SURVEYS:  FOTO 29>64 predicted Visit 8 , 12/23/22 : 56%  Visit 16: 01/27/23:    COGNITION: Overall cognitive status: Within functional limits for tasks assessed     SENSATION/OBSERVATION: Surgical site obscured by bandaging Pt denies any sensory complaints since nerve block wore off  POSTURE: Sling RUE, increased R UT elevation  UPPER EXTREMITY ROM:  ROM Right Eval NT Right 12/01/22 Rt  12/21/22 Rt. 01/04/23 RT 01/10/23 RT 01/25/23 RT  02/01/23  Shoulder flexion  115 AAROM 137 deg  AROM 155 supine Standing 70 deg with shoulder hike   90 deg standing  90 with compensations  Shoulder abduction  80 PROM 100 deg  AROM 126 sidelying      Shoulder internal rotation   AROM FR to Rt lumbar L3 Functional reach  L1-2     Shoulder external rotation  18 PROM 45 AROM 40  AROM at 30 deg 47 deg  FR to right top of head     Elbow flexion         Elbow extension         Wrist flexion         Wrist extension          (Blank rows = not tested) (Key: WFL = within functional limits not formally assessed, * = concordant pain, s =  stiffness/stretching sensation, NT = not tested)  Comments:  deferred given acuity of surgery, phase 1 on eval, elbow flex/ext AAROM grossly WNL  UPPER EXTREMITY MMT:  MMT Right eval R 12/27/22  Rt. 01/27/23  Shoulder flexion   3-/5  Shoulder extension     Shoulder abduction   3-/5  Shoulder extension     Shoulder internal rotation  5 5/5  Shoulder external rotation  4- 4/5  Elbow flexion     Elbow extension     Grip strength     (Blank rows = not tested)  (Key: WFL = within functional limits not formally assessed, * = concordant pain, s = stiffness/stretching sensation, NT = not tested)  Comments: deferred given acuity of surgery  SHOULDER SPECIAL TESTS: Deferred given acuity of surgery  JOINT MOBILITY TESTING:  Deferred given acuity of surgery    TREATMENT:          [facility] Adult PT Treatment:                                                DATE: 02/03/23 Therapeutic Exercise: UBE L2 2 min each way  Pulleys flex and scap 60 deg Inclined 7 reps AROM flexion- fatigue 60 deg Inclined Dowel flexion pullovers from lap 2 x 10  60 deg inclined Right short lever flexion x 6 - fatigued  Supine shoulder flexion AROM x 10, #1  10 x 2  S/L shoulder abduction  1# 2 x10 S/L shoulder ER AROM 1# 2 x 10 Prone AROM ext x 10, 1# x 10  Prone horiz abdct AROM 10 x 2 (neutral and ER)  Prone Row AROM x 10, 1# x 10     [facility] Adult PT Treatment:                                                DATE: 02/01/23 Therapeutic Exercise: Pulleys flexion and scaption Supine Shoulder flexion x 10 50 deg inclined shoulder flexion 10 x 2  Supine shoulder iso IR and ER with manual resist from PTA S/L shoulder abduction AROM x 10, 1# x10 S/L shoulder flexion AROM x 10, 1# x 10  S/L shoulder ER AROM x 10 , 1# x 10 Supine 1# protraction x 10  Supine rhythmic stabilization at 90  Manual Therapy: PROM all planes      [facility] Adult PT Treatment:                                                DATE:  01/27/23 Therapeutic Exercise: UBE 5 min L 1 for 5 min  Supine chest press 4 lbs dowel Supine 0-140 with weight on dowel x 15, then 0-90 deg x 10  Scapular protraction/retraction  4 lbs dowel 2 x 10  Isometric flex, ER, IR, ext 5 sec x 10 each, done with PT x 5 first then standing near wall  Added longer lever flexion/abduction  AAROM wall slides flexion , unable to do abduction  Seated chest press green band with lats active x 2 x 10  Standing black band retraction  x 15  Standing adduction green band x 15  Manual Therapy: PROM all planes  [facility] Adult PT Treatment:                                                DATE: 01/25/23 Therapeutic Exercise: Pulleys for flexion and scaption  Side lying abduction, flexion and horizontal abduction Supine protraction AROM 2 x 10 Supine long lever AROM flexion 2 x 10 Inclined long lever AROM x 10- more difficult  Prone horiz abdct 10 x 2  Prone row 10 x 2  Isometric flex, ER, IR, ext 5 sec x 10 each  GTB row  Manual Therapy: PROM, side lying scap mobs, TPR upper trap/ levator      [facility] Adult PT Treatment:                                                DATE: 01/14/23 Therapeutic Exercise: Pulleys flexion, scaption, internal rotation Standing dowel overhead lift , cross body reaching , abduction, extension  Extension/IR dowel  Red ball rolls 110 deg 1 min each direction Doorway/corner stretch 30 sec  x 5  Supine dowel flexion full ROM  Cactus Arms ER/IR  Manual Therapy: PROM all ranges to tolerance   [facility] Adult PT Treatment:                                                DATE: 01/10/23 Therapeutic Exercise: Pulleys  Standing cane flexion, abdct ER AAROM Standing cane ext and IR AAROM Side lying abdct AROM Sidelying flexion AROM  Sidelying ER AROM  Supine chest press and pullovers  Supine short lever flexion punch  Supine protraction AROM  Supine long lever AROM flexion x 10 ER stretch in doorway , low  Manual Therapy: PROM all  ranges to tolerance      [facility] Adult PT Treatment:                                                DATE: 01/06/23 Therapeutic Exercise: Pulleys Dowel AAROM seated : overhead , cross body reaching Standing extension and IR with Dowel  Standing flexion double arm on the wall  Single arm scaption ball on wall  Supine AAROM flexion AROM goal post x 10 , ER and IR  Supine circles x 10 each direction  Triceps x 15 (skull crusher with arm support)   Prone extension 2 x 10  Prone horiz. Abd  Prone row (all AROM )   Manual Therapy: PROM all planes in supine and prone to tolerance    [facility] Adult PT Treatment:                                                DATE: 01/04/23 Therapeutic Exercise: Pulleys flexion and scaption 2 min each  Wall slides with foam roller flexion  AAROM foam roller along spine: overhead flexion  Protraction/retraction  AAROM abduction and external rotation Supine scapular retraction  x 10  Rt UE AROM in supine and sidelying Flexion, scaption , ER x 15, punch (flexion) , reverse fly (AROM)  Row x 15 added rotation x 10 green band  Biceps and triceps green band x 15  Manual Therapy: PROM all ranges to tolerance    [facility] Adult PT Treatment:                                                DATE: 12/31/22 Therapeutic Exercise: Supine AAROM with dowel flexion, add/abd ER and chest press  Standing AAROM extension , IR 2  x 10  Seated AAROM abduction, flexion, ER  Standing closed chain shoulder taps , reaches  Lateral high plank walk Wall circle sweeps  Pulleys overhead 140 deg AAROM and scaption  Manual Therapy: Seated inferior glide for GH motion abduction  Teres minor and subscapularis , upper trap pressure point      PATIENT EDUCATION: Education details: Pt education on PT impairments, prognosis, and POC. Informed consent. Rationale for interventions, safe/appropriate HEP performance Person educated: Patient Education method: Explanation, Demonstration,  Tactile cues, Verbal cues, and Handouts Education comprehension: verbalized understanding, returned demonstration, verbal cues required, tactile cues required, and needs further education    HOME EXERCISE PROGRAM:  Access Code: [email or web address] URL: [email or web address] Date: 01/14/2023 Prepared by: Karie Mainland  Program Notes - with seated elbow flexion, work on gently straightening and bending the elbow with the support of the other arm  Exercises - Seated Elbow Extension with Self-Anchored Resistance  - 1 x daily - 7 x weekly - 2 sets - 10 reps - 5 hold - Standing Elbow Flexion with Resistance  - 1 x daily - 7 x weekly - 2 sets - 10 reps - 5 hold - Standing Shoulder External Rotation Stretch in Doorway  - 1 x daily - 7 x weekly - 2 sets - 10 reps - 10-20 hold - Standing Shoulder Abduction AAROM with Dowel  - 1 x daily - 7 x weekly - 2 sets - 10 reps - 10 hold - Standing Shoulder Extension with Dowel  - 1 x daily - 7 x weekly - 2 sets - 10 reps - 10 hold - Standing Shoulder Flexion AAROM with Dowel  - 1 x daily - 7 x weekly - 2 sets - 10 reps - 10 hold - Standing Shoulder Internal Rotation AAROM with Dowel  - 1 x daily - 7 x weekly - 2 sets - 10 reps - 10 hold - Seated Scapular Retraction  - 1 x daily - 7 x weekly - 2 sets - 10 reps - 10 hold - Corner Pec Major Stretch  - 1-2 x daily - 7 x weekly - 1 sets - 5 reps - 30 hold Added 4 way isometrics   ASSESSMENT:  CLINICAL IMPRESSION:  Pt a little late today. Continued with supine and reclined shoulder strengthening. Patient continues to demonstrate heavy compensations with scapula with shoulder flexion and abduction. Had some stabbing pains in superior shoulder yesterday that have resolved. Able to increase incline to 60 degrees and achieve OH AROM.   Cont with POC in phase of AROM and light strength. At end of session he reported feeling loosened up.    Pt reports soreness, not really pain. He is doing well with supine AROM,  nearly full for flexion and abduction however in standing he is limited to 90 degrees and below with shoulder hike. Progressed supine AROM flexion to an incline with pt reporting increased challenge but able to complete full ROM. Also initiated isometrics with good tolerance. Updated HEP with isometrics. Will plan to review iso and continued inclined flexion AROM.     OBJECTIVE IMPAIRMENTS: decreased activity tolerance, decreased endurance, decreased mobility, decreased ROM, decreased strength, impaired perceived functional ability, impaired flexibility, impaired UE functional use, postural dysfunction, and pain.   ACTIVITY LIMITATIONS: carrying, lifting, bending, sleeping, transfers, bathing, toileting, dressing, self feeding, reach over head, and hygiene/grooming  PARTICIPATION LIMITATIONS: meal prep, cleaning, laundry, driving, shopping, community activity, occupation, and yard work  PERSONAL FACTORS: Time since onset of injury/illness/exacerbation and 3+ comorbidities: anxiety, DM, HTN  are also affecting patient's functional outcome.   REHAB POTENTIAL: Good  CLINICAL DECISION MAKING: Stable/uncomplicated  EVALUATION COMPLEXITY: Low   GOALS: Goals reviewed with patient? No  SHORT TERM GOALS: Target date: 01/06/2023 Pt will demonstrate appropriate understanding and performance of initially prescribed HEP in order to facilitate improved independence with management of symptoms.  Baseline: HEP provided on eval Goal status:MET   2. Pt will score greater than or equal to 45 on FOTO in order to demonstrate improved perception of function due to symptoms.  Baseline: 29  Goal status: MET  LONG TERM GOALS: Target date: 02/17/2023   Pt will score 64 on FOTO in order to demonstrate improved perception of function due to symptoms. Baseline: 29 Goal status: ongoing   2.  Pt will demonstrate at least 150 degrees of active shoulder elevation in order to demonstrate improved tolerance to  functional movement patterns such as overhead reaching. Baseline: deferred on eval given acuity of surgery Goal status: MET   3.  Pt will demonstrate at least 4+/5 shoulder flex/abduction MMT for improved symmetry of UE strength and improved tolerance to functional movements.  Baseline: deferred on eval given acuity of surgery Goal status: ongoing   4. Pt will report/demonstrate ability to perform upper body dressing with less than 2 point increase in pain on NPS in order to indicate improved tolerance/independence to ADLs.  Baseline: unable to perform, requiring family assist  Goal status: INITIAL   5. Pt will be able to lift at least 40# from floor to waist with less than 2 pt increase in pain in order to facilitate improved tolerance to work tasks.  Baseline: unable  Goal status: INITIAL  PLAN:  PT FREQUENCY: 2x/week  PT DURATION: 12 weeks  PLANNED INTERVENTIONS: 97164- PT Re-evaluation, 97110-Therapeutic exercises, 97530- Therapeutic activity, 97112- Neuromuscular re-education, 97535- Self Care, 69678- Manual therapy, 97014- Electrical stimulation (unattended), 97016- Vasopneumatic device, Patient/Family education, Balance training, Stair training, Taping, Dry Needling, Joint mobilization, Spinal mobilization, Scar mobilization, Cryotherapy, and Moist heat  PLAN FOR NEXT SESSION:  Begin Strength 12/31. Progress AAROM to AROM.  Manual therapy, scapular retraction, posture : STRENGTHEN IN SUPINE AND RECLINED, PROGRESSING TO UPRIGHT.   Karie Mainland, PT 02/03/23 11:21 AM Phone: [phone] Fax: [phone]

## 2023-02-07 NOTE — Therapy (Unsigned)
OUTPATIENT PHYSICAL THERAPY SHOULDER TREATMENT   Patient Name: [name] [name] MRN: [id number] DOB:12-24-1975, 48 y.o., male Today's Date: 02/08/2023  END OF SESSION:  PT End of Session - 02/08/23 1024     Visit Number 19    Number of Visits 25    Date for PT Re-Evaluation 02/18/23    Authorization Type [employer]    Authorization Time Period 60VL no auth required    Progress Note Due on Visit 10    PT Start Time 1019    PT Stop Time 1107    PT Time Calculation (min) 48 min    Activity Tolerance Patient tolerated treatment well    Behavior During Therapy WFL for tasks assessed/performed                    Past Medical History:  Diagnosis Date   Anxiety    Diabetes mellitus without complication (HCC)    GERD (gastroesophageal reflux disease)    Hypertension    Obesity (BMI 30-39.9)    Plantar fasciitis of right foot    Skin lesion    Tendonitis    Traumatic tear of left rotator cuff 05/20/2016   Past Surgical History:  Procedure Laterality Date   SHOULDER ACROMIOPLASTY Right 11/23/2022   Procedure: RIGHT SHOULDER ACROMIOPLASTY;  Surgeon: Huel Cote, MD;  Location: [facility];  Service: Orthopedics;  Laterality: Right;   SHOULDER ARTHROSCOPY WITH ROTATOR CUFF REPAIR AND SUBACROMIAL DECOMPRESSION Left 05/21/2016   Procedure: SHOULDER ARTHROSCOPY WITH ROTATOR CUFF REPAIR AND SUBACROMIAL DECOMPRESSION LABRAL DEBRIDEMENT;  Surgeon: Salvatore Marvel, MD;  Location: [facility];  Service: Orthopedics;  Laterality: Left;   VASECTOMY     Patient Active Problem List   Diagnosis Date Noted   Traumatic complete tear of right rotator cuff 11/23/2022   Lab test positive for detection of COVID-19 virus 02/23/2022   Paresthesia 10/23/2018   Traumatic tear of left rotator cuff 05/20/2016   Family history of hypertrophic cardiomyopathy 07-30-14   Family history of sudden cardiac death in son 07/30/14    PCP: Farris Has, MD  REFERRING  PROVIDER: Huel Cote, MD  REFERRING DIAG: [phone] (ICD-10-CM) - Traumatic complete tear of right rotator cuff, initial encounter  THERAPY DIAG:  Right shoulder pain, unspecified chronicity  Stiffness of right shoulder, not elsewhere classified  Muscle weakness (generalized)  Rationale for Evaluation and Treatment: Rehabilitation  ONSET DATE: 11/23/22 rotator cuff repair, superior capsular reconstruction, acromioplasty  SUBJECTIVE:  SUBJECTIVE STATEMENT: Reports no issues with the shoulder he has discomfort but no significant pain.  EVAL: In August pt was working on a ladder, fell backwards but unsure quite how he landed. States he did have a mild concussion but that has since resolved. Previously independent and active for work. Since surgery on 11/23/22, has been receiving assistance from wife and kids for daily tasks. Sleeping in recliner with sling and pillow under arm. Icing 3-4x/day ~15min.  Hand dominance: Right  PERTINENT HISTORY: BIL traumatic RC tears, anxiety, DM, GERD, HTN H/O Lt RCR 2018  Recent note : MD  01/05/23: 6-week status post right shoulder superior capsular reconstruction overall doing very well.  At this time I discussed I would like him to continue through the active range of motion protocol and to work on overhead active range of motion.  Overall he is doing quite well for this stage.  I will plan to see him back in 6 weeks for reassessment   PAIN:  Are you having pain: 0/10 today  Location/description: occasional burning and popping; R shoulder, superiorly Best-worst over past week: 0-7/10  - aggravating factors: sleeping and mornings,  - Easing factors: sling, medication    PRECAUTIONS: s/p R RCR 11/23/22; using Dr. Steward Drone rotator cuff repair protocol per op note   WEIGHT  BEARING RESTRICTIONS: Yes NWB surgical limb  FALLS:  Has patient fallen in last 6 months? Yes. 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 Lifts and carries up to ~50#  PLOF: Independent  PATIENT GOALS: get back to playing golf, shooting pool  NEXT MD VISIT: Nov 18th   OBJECTIVE:  Note: Objective measures were completed at Evaluation unless otherwise noted.  DIAGNOSTIC FINDINGS:  S/p shoulder surgery 11/23/22 Panel 1: Right RIGHT SHOULDER ARTHROSCOPY WITH SUPERIOR CAPSULAR RECONSTRUCTION / EXTENSIVE DEBRIDEMENT with Huel Cote, MD  Panel 1: Right RIGHT SHOULDER ACROMIOPLASTY with Huel Cote, MD   PATIENT SURVEYS:  FOTO 29>64 predicted Visit 8 , 12/23/22 : 56%  Visit 16: 01/27/23:    COGNITION: Overall cognitive status: Within functional limits for tasks assessed     SENSATION/OBSERVATION: Surgical site obscured by bandaging Pt denies any sensory complaints since nerve block wore off  POSTURE: Sling RUE, increased R UT elevation  UPPER EXTREMITY ROM:  ROM Right Eval NT Right 12/01/22 Rt  12/21/22 Rt. 01/04/23 RT 01/10/23 RT 01/25/23 RT  02/01/23  Shoulder flexion  115 AAROM 137 deg  AROM 155 supine Standing 70 deg with shoulder hike   90 deg standing  90 with compensations  Shoulder abduction  80 PROM 100 deg  AROM 126 sidelying      Shoulder internal rotation   AROM FR to Rt lumbar L3 Functional reach  L1-2     Shoulder external rotation  18 PROM 45 AROM 40  AROM at 30 deg 47 deg  FR to right top of head     Elbow flexion         Elbow extension         Wrist flexion         Wrist extension          (Blank rows = not  tested) (Key: WFL = within functional limits not  formally assessed, * = concordant pain, s = stiffness/stretching sensation, NT = not tested)  Comments: deferred given acuity of surgery, phase 1 on eval, elbow flex/ext AAROM grossly WNL  UPPER EXTREMITY MMT:  MMT Right eval R 12/27/22  Rt. 01/27/23  Shoulder flexion   3-/5  Shoulder extension     Shoulder abduction   3-/5  Shoulder extension     Shoulder internal rotation  5 5/5  Shoulder external rotation  4- 4/5  Elbow flexion     Elbow extension     Grip strength     (Blank rows = not tested)  (Key: WFL = within functional limits not formally assessed, * = concordant pain, s = stiffness/stretching sensation, NT = not tested)  Comments: deferred given acuity of surgery  SHOULDER SPECIAL TESTS: Deferred given acuity of surgery  JOINT MOBILITY TESTING:  Deferred given acuity of surgery    TREATMENT:          [facility] Adult PT Treatment:                                                DATE: 02/08/23 Therapeutic Exercise: UBE L3 3 min forward and 3 min back  Supine dowel A/AROM, 2.5 # x 15 overhead and chest press  40 deg Inclined Rt UE flexion x 10 , 2 lbs x 10  40 deg Inclined Dowel flexion pullovers from lap 2 x 10, added 2.5 lbs  40 deg inclined Right short lever flexion x 6- 10 - 2 lbs x  3 sets  Standing flexion to 60 deg 2 lbs  Standing Green circle band chest press x 10 , then 0-45 deg , another set with bent elbows  ER pulse at neutral x 30 sec Seated flexion on table   Used blue band for self traction in extension, flexion, abduction, adduction Active adduction with correction for Rt GH jt.  Rhomboid stretch Red ball rolls 1 min each direction  Manual Therapy: PROM all planes Rt UE with prolonged hold into abduction, distraction  Modalities: Cold pack 10 min   [facility] Adult PT Treatment:                                                DATE: 02/03/23 Therapeutic Exercise: UBE L2 2 min each way  Pulleys flex and  scap 60 deg Inclined 7 reps AROM flexion- fatigue 60 deg Inclined Dowel flexion pullovers from lap 2 x 10  60 deg inclined Right short lever flexion x 6 - fatigued  Supine shoulder flexion AROM x 10, #1  10 x 2  S/L shoulder abduction  1# 2 x10 S/L shoulder ER AROM 1# 2 x 10 Prone AROM ext x 10, 1# x 10  Prone horiz abdct AROM 10 x 2 (neutral and ER)  Prone Row AROM x 10, 1# x 10     [facility] Adult PT Treatment:                                                DATE: 02/01/23 Therapeutic Exercise: Pulleys flexion and scaption Supine Shoulder  flexion x 10 50 deg inclined shoulder flexion 10 x 2  Supine shoulder iso IR and ER with manual resist from PTA S/L shoulder abduction AROM x 10, 1# x10 S/L shoulder flexion AROM x 10, 1# x 10  S/L shoulder ER AROM x 10 , 1# x 10 Supine 1# protraction x 10  Supine rhythmic stabilization at 90  Manual Therapy: PROM all planes      [facility] Adult PT Treatment:                                                DATE: 01/27/23 Therapeutic Exercise: UBE 5 min L 1 for 5 min  Supine chest press 4 lbs dowel Supine 0-140 with weight on dowel x 15, then 0-90 deg x 10  Scapular protraction/retraction  4 lbs dowel 2 x 10  Isometric flex, ER, IR, ext 5 sec x 10 each, done with PT x 5 first then standing near wall  Added longer lever flexion/abduction  AAROM wall slides flexion , unable to do abduction  Seated chest press green band with lats active x 2 x 10  Standing black band retraction  x 15  Standing adduction green band x 15  Manual Therapy: PROM all planes  [facility] Adult PT Treatment:                                                DATE: 01/25/23 Therapeutic Exercise: Pulleys for flexion and scaption  Side lying abduction, flexion and horizontal abduction Supine protraction AROM 2 x 10 Supine long lever AROM flexion 2 x 10 Inclined long lever AROM x 10- more difficult  Prone horiz abdct 10 x 2  Prone row 10 x 2  Isometric flex, ER, IR, ext 5 sec x 10 each   GTB row  Manual Therapy: PROM, side lying scap mobs, TPR upper trap/ levator      [facility] Adult PT Treatment:                                                DATE: 01/14/23 Therapeutic Exercise: Pulleys flexion, scaption, internal rotation Standing dowel overhead lift , cross body reaching , abduction, extension  Extension/IR dowel  Red ball rolls 110 deg 1 min each direction Doorway/corner stretch 30 sec  x 5  Supine dowel flexion full ROM  Cactus Arms ER/IR  Manual Therapy: PROM all ranges to tolerance       PATIENT EDUCATION: Education details: Pt education on PT impairments, prognosis, and POC. Informed consent. Rationale for interventions, safe/appropriate HEP performance Person educated: Patient Education method: Explanation, Demonstration, Tactile cues, Verbal cues, and Handouts Education comprehension: verbalized understanding, returned demonstration, verbal cues required, tactile cues required, and needs further education    HOME EXERCISE PROGRAM:  Access Code: [email or web address] URL: [email or web address] Date: 01/14/2023 Prepared by: Karie Mainland  Program Notes - with seated elbow flexion, work on gently straightening and bending the elbow with the support of the other arm  Exercises - Seated Elbow Extension with Self-Anchored Resistance  - 1 x daily - 7 x weekly - 2 sets - 10  reps - 5 hold - Standing Elbow Flexion with Resistance  - 1 x daily - 7 x weekly - 2 sets - 10 reps - 5 hold - Standing Shoulder External Rotation Stretch in Doorway  - 1 x daily - 7 x weekly - 2 sets - 10 reps - 10-20 hold - Standing Shoulder Abduction AAROM with Dowel  - 1 x daily - 7 x weekly - 2 sets - 10 reps - 10 hold - Standing Shoulder Extension with Dowel  - 1 x daily - 7 x weekly - 2 sets - 10 reps - 10 hold - Standing Shoulder Flexion AAROM with Dowel  - 1 x daily - 7 x weekly - 2 sets - 10 reps - 10 hold - Standing Shoulder Internal Rotation AAROM with Dowel  - 1 x daily -  7 x weekly - 2 sets - 10 reps - 10 hold - Seated Scapular Retraction  - 1 x daily - 7 x weekly - 2 sets - 10 reps - 10 hold - Corner Pec Major Stretch  - 1-2 x daily - 7 x weekly - 1 sets - 5 reps - 30 hold Added 4 way isometrics   ASSESSMENT:  CLINICAL IMPRESSION:  Patient continues to work toward right upper extremity strengthening while minimizing compensation flexion /abduction.  Utilized heavy band for self traction Rt shoulder at home in multiple planes.  Session well tolerated.  Patient advised to make more appointments today as he will likely need to continue for strengthening.  OBJECTIVE IMPAIRMENTS: decreased activity tolerance, decreased endurance, decreased mobility, decreased ROM, decreased strength, impaired perceived functional ability, impaired flexibility, impaired UE functional use, postural dysfunction, and pain.   ACTIVITY LIMITATIONS: carrying, lifting, bending, sleeping, transfers, bathing, toileting, dressing, self feeding, reach over head, and hygiene/grooming  PARTICIPATION LIMITATIONS: meal prep, cleaning, laundry, driving, shopping, community activity, occupation, and yard work  PERSONAL FACTORS: Time since onset of injury/illness/exacerbation and 3+ comorbidities: anxiety, DM, HTN  are also affecting patient's functional outcome.   REHAB POTENTIAL: Good  CLINICAL DECISION MAKING: Stable/uncomplicated  EVALUATION COMPLEXITY: Low   GOALS: Goals reviewed with patient? No  SHORT TERM GOALS: Target date: 01/06/2023 Pt will demonstrate appropriate understanding and performance of initially prescribed HEP in order to facilitate improved independence with management of symptoms.  Baseline: HEP provided on eval Goal status:MET   2. Pt will score greater than or equal to 45 on FOTO in order to demonstrate improved perception of function due to symptoms.  Baseline: 29  Goal status: MET  LONG TERM GOALS: Target date: 02/17/2023   Pt will score 64 on FOTO in order  to demonstrate improved perception of function due to symptoms. Baseline: 29 Goal status: ongoing   2.  Pt will demonstrate at least 150 degrees of active shoulder elevation in order to demonstrate improved tolerance to functional movement patterns such as overhead reaching. Baseline: deferred on eval given acuity of surgery Goal status: MET   3.  Pt will demonstrate at least 4+/5 shoulder flex/abduction MMT for improved symmetry of UE strength and improved tolerance to functional movements.  Baseline: deferred on eval given acuity of surgery Goal status: ongoing   4. Pt will report/demonstrate ability to perform upper body dressing with less than 2 point increase in pain on NPS in order to indicate improved tolerance/independence to ADLs.  Baseline: unable to perform, requiring family assist  Goal status: INITIAL   5. Pt will be able to lift at least 40# from floor to waist  with less than 2 pt increase in pain in order to facilitate improved tolerance to work tasks.  Baseline: unable  Goal status: INITIAL  PLAN:  PT FREQUENCY: 2x/week  PT DURATION: 12 weeks  PLANNED INTERVENTIONS: 97164- PT Re-evaluation, 97110-Therapeutic exercises, 97530- Therapeutic activity, 97112- Neuromuscular re-education, 97535- Self Care, 82956- Manual therapy, 97014- Electrical stimulation (unattended), 97016- Vasopneumatic device, Patient/Family education, Balance training, Stair training, Taping, Dry Needling, Joint mobilization, Spinal mobilization, Scar mobilization, Cryotherapy, and Moist heat  PLAN FOR NEXT SESSION:  Begin Strength 12/31. Progress AAROM to AROM.  Manual therapy, scapular retraction, posture : STRENGTHEN IN SUPINE AND RECLINED, PROGRESSING TO UPRIGHT.   Karie Mainland, PT 02/08/23 11:06 AM Phone: [phone] Fax: [phone]

## 2023-02-08 ENCOUNTER — Ambulatory Visit: Payer: 59 | Admitting: Physical Therapy

## 2023-02-08 ENCOUNTER — Encounter: Payer: Self-pay | Admitting: Physical Therapy

## 2023-02-08 DIAGNOSIS — M25611 Stiffness of right shoulder, not elsewhere classified: Secondary | ICD-10-CM

## 2023-02-08 DIAGNOSIS — M6281 Muscle weakness (generalized): Secondary | ICD-10-CM

## 2023-02-08 DIAGNOSIS — M25511 Pain in right shoulder: Secondary | ICD-10-CM

## 2023-02-10 ENCOUNTER — Ambulatory Visit: Payer: 59 | Admitting: Physical Therapy

## 2023-02-10 ENCOUNTER — Encounter: Payer: Self-pay | Admitting: Physical Therapy

## 2023-02-10 DIAGNOSIS — M25611 Stiffness of right shoulder, not elsewhere classified: Secondary | ICD-10-CM

## 2023-02-10 DIAGNOSIS — M25511 Pain in right shoulder: Secondary | ICD-10-CM | POA: Diagnosis not present

## 2023-02-10 DIAGNOSIS — M6281 Muscle weakness (generalized): Secondary | ICD-10-CM

## 2023-02-10 NOTE — Therapy (Signed)
OUTPATIENT PHYSICAL THERAPY SHOULDER TREATMENT   Patient Name: [name] [name] MRN: [id number] DOB:06-Aug-1975, 48 y.o., male Today's Date: 02/10/2023  END OF SESSION:  PT End of Session - 02/10/23 1017     Visit Number 20    Number of Visits 25    Date for PT Re-Evaluation 02/18/23    Authorization Type [employer]    Authorization Time Period 60VL no auth required    PT Start Time 1017    PT Stop Time 1100    PT Time Calculation (min) 43 min                    Past Medical History:  Diagnosis Date   Anxiety    Diabetes mellitus without complication (HCC)    GERD (gastroesophageal reflux disease)    Hypertension    Obesity (BMI 30-39.9)    Plantar fasciitis of right foot    Skin lesion    Tendonitis    Traumatic tear of left rotator cuff 05/20/2016   Past Surgical History:  Procedure Laterality Date   SHOULDER ACROMIOPLASTY Right 11/23/2022   Procedure: RIGHT SHOULDER ACROMIOPLASTY;  Surgeon: Huel Cote, MD;  Location: [facility];  Service: Orthopedics;  Laterality: Right;   SHOULDER ARTHROSCOPY WITH ROTATOR CUFF REPAIR AND SUBACROMIAL DECOMPRESSION Left 05/21/2016   Procedure: SHOULDER ARTHROSCOPY WITH ROTATOR CUFF REPAIR AND SUBACROMIAL DECOMPRESSION LABRAL DEBRIDEMENT;  Surgeon: Salvatore Marvel, MD;  Location:  SURGERY CENTER;  Service: Orthopedics;  Laterality: Left;   VASECTOMY     Patient Active Problem List   Diagnosis Date Noted   Traumatic complete tear of right rotator cuff 11/23/2022   Lab test positive for detection of COVID-19 virus 02/23/2022   Paresthesia 10/23/2018   Traumatic tear of left rotator cuff 05/20/2016   Family history of hypertrophic cardiomyopathy August 08, 2014   Family history of sudden cardiac death in son 08-08-14    PCP: Farris Has, MD  REFERRING PROVIDER: Huel Cote, MD  REFERRING DIAG: [phone] (ICD-10-CM) - Traumatic complete tear of right rotator cuff, initial encounter  THERAPY DIAG:   Right shoulder pain, unspecified chronicity  Stiffness of right shoulder, not elsewhere classified  Muscle weakness (generalized)  Rationale for Evaluation and Treatment: Rehabilitation  ONSET DATE: 11/23/22 rotator cuff repair, superior capsular reconstruction, acromioplasty  SUBJECTIVE:                                                                                                                                                                                      SUBJECTIVE STATEMENT: Reports stiffness in the mornings. Not really pain. Just weak.   EVAL: In August pt was working on a ladder, fell  backwards but unsure quite how he landed. States he did have a mild concussion but that has since resolved. Previously independent and active for work. Since surgery on 11/23/22, has been receiving assistance from wife and kids for daily tasks. Sleeping in recliner with sling and pillow under arm. Icing 3-4x/day ~26min.  Hand dominance: Right  PERTINENT HISTORY: BIL traumatic RC tears, anxiety, DM, GERD, HTN H/O Lt RCR 2018  Recent note : MD  01/05/23: 6-week status post right shoulder superior capsular reconstruction overall doing very well.  At this time I discussed I would like him to continue through the active range of motion protocol and to work on overhead active range of motion.  Overall he is doing quite well for this stage.  I will plan to see him back in 6 weeks for reassessment   PAIN:  Are you having pain: 0/10 today  Location/description: occasional burning and popping; R shoulder, superiorly Best-worst over past week: 0-7/10  - aggravating factors: sleeping and mornings,  - Easing factors: sling, medication    PRECAUTIONS: s/p R RCR 11/23/22; using Dr. Steward Drone rotator cuff repair protocol per op note   WEIGHT BEARING RESTRICTIONS: Yes NWB surgical limb  FALLS:  Has patient fallen in last 6 months? Yes. 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 Lifts and carries up to ~50#  PLOF: Independent  PATIENT GOALS: get back to playing golf, shooting pool  NEXT MD VISIT: Nov 18th , Feb 12   OBJECTIVE:  Note: Objective measures were completed at Evaluation unless otherwise noted.  DIAGNOSTIC FINDINGS:  S/p shoulder surgery 11/23/22 Panel 1: Right RIGHT SHOULDER ARTHROSCOPY WITH SUPERIOR CAPSULAR RECONSTRUCTION / EXTENSIVE DEBRIDEMENT with Huel Cote, MD  Panel 1: Right RIGHT SHOULDER ACROMIOPLASTY with Huel Cote, MD   PATIENT SURVEYS:  FOTO 29>64 predicted Visit 8 , 12/23/22 : 56%  Visit 16: 01/27/23:    COGNITION: Overall cognitive status: Within functional limits for tasks assessed     SENSATION/OBSERVATION: Surgical site obscured by bandaging Pt denies any sensory complaints since nerve block wore off  POSTURE: Sling RUE, increased R UT elevation  UPPER EXTREMITY ROM:  ROM Right Eval NT Right 12/01/22 Rt  12/21/22 Rt. 01/04/23 RT 01/10/23 RT 01/25/23 RT  02/01/23  Shoulder flexion  115 AAROM 137 deg  AROM 155 supine Standing 70 deg with shoulder hike   90 deg standing  90 with compensations  Shoulder abduction  80 PROM 100 deg  AROM 126 sidelying      Shoulder internal rotation   AROM FR to Rt lumbar L3 Functional reach  L1-2     Shoulder external rotation  18 PROM 45 AROM 40  AROM at 30 deg 47 deg  FR to right top of head     Elbow flexion         Elbow extension         Wrist flexion         Wrist extension          (Blank rows = not tested) (Key: WFL = within functional limits not formally assessed, * = concordant pain, s = stiffness/stretching sensation, NT = not tested)   Comments: deferred given acuity of surgery, phase 1  on eval, elbow flex/ext AAROM grossly WNL  UPPER EXTREMITY MMT:  MMT Right eval R 12/27/22  Rt. 01/27/23  Shoulder flexion   3-/5  Shoulder extension     Shoulder abduction   3-/5  Shoulder extension     Shoulder internal rotation  5 5/5  Shoulder external rotation  4- 4/5  Elbow flexion     Elbow extension     Grip strength     (Blank rows = not tested)  (Key: WFL = within functional limits not formally assessed, * = concordant pain, s = stiffness/stretching sensation, NT = not tested)  Comments: deferred given acuity of surgery  SHOULDER SPECIAL TESTS: Deferred given acuity of surgery  JOINT MOBILITY TESTING:  Deferred given acuity of surgery    TREATMENT:         [facility] Adult PT Treatment:                                                DATE: 02/10/23 Therapeutic Exercise: UBE L3  3 min each way  Pulleys flexion and scaption Supine flexion 2# 10 x 3 Supine short lever 3# flexion 10 x 3 45 degree Inclined long lever 2# flexion 5 x 2 , 1# 5  x 2  40 deg inclined Right short lever flexion 3#  x 6 reps x   3 sets  40 degree incline horiz abdct red band 8 x 2  40 degree incline bilat ER red band 10 x 3  40 degree incline Right IR GTB 10 x  2 S/L shoulder abduction 2# 2 x 10      [facility] Adult PT Treatment:                                                DATE: 02/08/23 Therapeutic Exercise: UBE L3 3 min forward and 3 min back  Supine dowel A/AROM, 2.5 # x 15 overhead and chest press  40 deg Inclined Rt UE flexion x 10 , 2 lbs x 10  40 deg Inclined Dowel flexion pullovers from lap 2 x 10, added 2.5 lbs  40 deg inclined Right short lever flexion x 6- 10 - 2 lbs x  3 sets  Standing flexion to 60 deg 2 lbs  Standing Green circle band chest press x 10 , then 0-45 deg , another set with bent elbows  ER pulse at neutral x 30 sec Seated flexion on table   Used blue band for self traction in extension, flexion, abduction,  adduction Active adduction with correction for Rt GH jt.  Rhomboid stretch Red ball rolls 1 min each direction  Manual Therapy: PROM all planes Rt UE with prolonged hold into abduction, distraction  Modalities: Cold pack 10 min   [facility] Adult PT Treatment:                                                DATE: 02/03/23 Therapeutic Exercise: UBE L2 2 min each way  Pulleys flex and scap 60 deg Inclined 7 reps AROM flexion- fatigue 60 deg Inclined Dowel flexion pullovers from lap  2 x 10  60 deg inclined Right short lever flexion x 6 - fatigued  Supine shoulder flexion AROM x 10, #1  10 x 2  S/L shoulder abduction  1# 2 x10 S/L shoulder ER AROM 1# 2 x 10 Prone AROM ext x 10, 1# x 10  Prone horiz abdct AROM 10 x 2 (neutral and ER)  Prone Row AROM x 10, 1# x 10     [facility] Adult PT Treatment:                                                DATE: 02/01/23 Therapeutic Exercise: Pulleys flexion and scaption Supine Shoulder flexion x 10 50 deg inclined shoulder flexion 10 x 2  Supine shoulder iso IR and ER with manual resist from PTA S/L shoulder abduction AROM x 10, 1# x10 S/L shoulder flexion AROM x 10, 1# x 10  S/L shoulder ER AROM x 10 , 1# x 10 Supine 1# protraction x 10  Supine rhythmic stabilization at 90  Manual Therapy: PROM all planes      [facility] Adult PT Treatment:                                                DATE: 01/27/23 Therapeutic Exercise: UBE 5 min L 1 for 5 min  Supine chest press 4 lbs dowel Supine 0-140 with weight on dowel x 15, then 0-90 deg x 10  Scapular protraction/retraction  4 lbs dowel 2 x 10  Isometric flex, ER, IR, ext 5 sec x 10 each, done with PT x 5 first then standing near wall  Added longer lever flexion/abduction  AAROM wall slides flexion , unable to do abduction  Seated chest press green band with lats active x 2 x 10  Standing black band retraction  x 15  Standing adduction green band x 15  Manual Therapy: PROM all planes  [facility] Adult PT  Treatment:                                                DATE: 01/25/23 Therapeutic Exercise: Pulleys for flexion and scaption  Side lying abduction, flexion and horizontal abduction Supine protraction AROM 2 x 10 Supine long lever AROM flexion 2 x 10 Inclined long lever AROM x 10- more difficult  Prone horiz abdct 10 x 2  Prone row 10 x 2  Isometric flex, ER, IR, ext 5 sec x 10 each  GTB row  Manual Therapy: PROM, side lying scap mobs, TPR upper trap/ levator      [facility] Adult PT Treatment:                                                DATE: 01/14/23 Therapeutic Exercise: Pulleys flexion, scaption, internal rotation Standing dowel overhead lift , cross body reaching , abduction, extension  Extension/IR dowel  Red ball rolls 110 deg 1 min each direction Doorway/corner stretch 30 sec  x 5  Supine dowel flexion full ROM  Cactus Arms ER/IR  Manual Therapy: PROM all ranges to tolerance       PATIENT EDUCATION: Education details: Pt education on PT impairments, prognosis, and POC. Informed consent. Rationale for interventions, safe/appropriate HEP performance Person educated: Patient Education method: Explanation, Demonstration, Tactile cues, Verbal cues, and Handouts Education comprehension: verbalized understanding, returned demonstration, verbal cues required, tactile cues required, and needs further education    HOME EXERCISE PROGRAM:  Access Code: [email or web address] URL: [email or web address] Date: 01/14/2023 Prepared by: Karie Mainland  Program Notes - with seated elbow flexion, work on gently straightening and bending the elbow with the support of the other arm  Exercises - Seated Elbow Extension with Self-Anchored Resistance  - 1 x daily - 7 x weekly - 2 sets - 10 reps - 5 hold - Standing Elbow Flexion with Resistance  - 1 x daily - 7 x weekly - 2 sets - 10 reps - 5 hold - Standing Shoulder External Rotation Stretch in Doorway  - 1 x daily - 7 x weekly - 2 sets  - 10 reps - 10-20 hold - Standing Shoulder Abduction AAROM with Dowel  - 1 x daily - 7 x weekly - 2 sets - 10 reps - 10 hold - Standing Shoulder Extension with Dowel  - 1 x daily - 7 x weekly - 2 sets - 10 reps - 10 hold - Standing Shoulder Flexion AAROM with Dowel  - 1 x daily - 7 x weekly - 2 sets - 10 reps - 10 hold - Standing Shoulder Internal Rotation AAROM with Dowel  - 1 x daily - 7 x weekly - 2 sets - 10 reps - 10 hold - Seated Scapular Retraction  - 1 x daily - 7 x weekly - 2 sets - 10 reps - 10 hold - Corner Pec Major Stretch  - 1-2 x daily - 7 x weekly - 1 sets - 5 reps - 30 hold Added 4 way isometrics   ASSESSMENT:  CLINICAL IMPRESSION:  Patient continues to work toward right upper extremity strengthening while minimizing compensation flexion /abduction.  Able to achieve long lever flexion from 40-45 degree incline. Otherwise unable to achieve full rom without compensations/ assistance. Session well tolerated.  Patient advised to make more appointments today as he will likely need to continue for strengthening.  OBJECTIVE IMPAIRMENTS: decreased activity tolerance, decreased endurance, decreased mobility, decreased ROM, decreased strength, impaired perceived functional ability, impaired flexibility, impaired UE functional use, postural dysfunction, and pain.   ACTIVITY LIMITATIONS: carrying, lifting, bending, sleeping, transfers, bathing, toileting, dressing, self feeding, reach over head, and hygiene/grooming  PARTICIPATION LIMITATIONS: meal prep, cleaning, laundry, driving, shopping, community activity, occupation, and yard work  PERSONAL FACTORS: Time since onset of injury/illness/exacerbation and 3+ comorbidities: anxiety, DM, HTN  are also affecting patient's functional outcome.   REHAB POTENTIAL: Good  CLINICAL DECISION MAKING: Stable/uncomplicated  EVALUATION COMPLEXITY: Low   GOALS: Goals reviewed with patient? No  SHORT TERM GOALS: Target date: 01/06/2023 Pt will  demonstrate appropriate understanding and performance of initially prescribed HEP in order to facilitate improved independence with management of symptoms.  Baseline: HEP provided on eval Goal status:MET   2. Pt will score greater than or equal to 45 on FOTO in order to demonstrate improved perception of function due to symptoms.  Baseline: 29  Goal status: MET  LONG TERM GOALS: Target date: 02/17/2023   Pt will score 64 on FOTO in order to demonstrate improved perception of function due to  symptoms. Baseline: 29 Goal status: ongoing   2.  Pt will demonstrate at least 150 degrees of active shoulder elevation in order to demonstrate improved tolerance to functional movement patterns such as overhead reaching. Baseline: deferred on eval given acuity of surgery Goal status: MET   3.  Pt will demonstrate at least 4+/5 shoulder flex/abduction MMT for improved symmetry of UE strength and improved tolerance to functional movements.  Baseline: deferred on eval given acuity of surgery Goal status: ongoing   4. Pt will report/demonstrate ability to perform upper body dressing with less than 2 point increase in pain on NPS in order to indicate improved tolerance/independence to ADLs.  Baseline: unable to perform, requiring family assist  Goal status: INITIAL   5. Pt will be able to lift at least 40# from floor to waist with less than 2 pt increase in pain in order to facilitate improved tolerance to work tasks.  Baseline: unable  Goal status: INITIAL  PLAN:  PT FREQUENCY: 2x/week  PT DURATION: 12 weeks  PLANNED INTERVENTIONS: 97164- PT Re-evaluation, 97110-Therapeutic exercises, 97530- Therapeutic activity, 97112- Neuromuscular re-education, 97535- Self Care, 71062- Manual therapy, 97014- Electrical stimulation (unattended), 97016- Vasopneumatic device, Patient/Family education, Balance training, Stair training, Taping, Dry Needling, Joint mobilization, Spinal mobilization, Scar mobilization,  Cryotherapy, and Moist heat  PLAN FOR NEXT SESSION:  Begin Strength 12/31. Progress AAROM to AROM.  Manual therapy, scapular retraction, posture : STRENGTHEN IN SUPINE AND RECLINED, PROGRESSING TO UPRIGHT.   Jannette Spanner, PTA 02/10/23 11:04 AM Phone: [phone] Fax: [phone]

## 2023-02-14 NOTE — Therapy (Unsigned)
OUTPATIENT PHYSICAL THERAPY SHOULDER TREATMENT   Patient Name: [name] [name] MRN: [id number] DOB:1975-03-22, 48 y.o., male Today's Date: 02/15/2023  END OF SESSION:  PT End of Session - 02/15/23 1021     Visit Number 21    Number of Visits 25    Date for PT Re-Evaluation 02/18/23    Authorization Type [employer]    Authorization Time Period 60VL no auth required    Progress Note Due on Visit 10    PT Start Time 1018    PT Stop Time 1059    PT Time Calculation (min) 41 min    Activity Tolerance Patient tolerated treatment well    Behavior During Therapy WFL for tasks assessed/performed                     Past Medical History:  Diagnosis Date   Anxiety    Diabetes mellitus without complication (HCC)    GERD (gastroesophageal reflux disease)    Hypertension    Obesity (BMI 30-39.9)    Plantar fasciitis of right foot    Skin lesion    Tendonitis    Traumatic tear of left rotator cuff 05/20/2016   Past Surgical History:  Procedure Laterality Date   SHOULDER ACROMIOPLASTY Right 11/23/2022   Procedure: RIGHT SHOULDER ACROMIOPLASTY;  Surgeon: Huel Cote, MD;  Location: [facility];  Service: Orthopedics;  Laterality: Right;   SHOULDER ARTHROSCOPY WITH ROTATOR CUFF REPAIR AND SUBACROMIAL DECOMPRESSION Left 05/21/2016   Procedure: SHOULDER ARTHROSCOPY WITH ROTATOR CUFF REPAIR AND SUBACROMIAL DECOMPRESSION LABRAL DEBRIDEMENT;  Surgeon: Salvatore Marvel, MD;  Location: [facility];  Service: Orthopedics;  Laterality: Left;   VASECTOMY     Patient Active Problem List   Diagnosis Date Noted   Traumatic complete tear of right rotator cuff 11/23/2022   Lab test positive for detection of COVID-19 virus 02/23/2022   Paresthesia 10/23/2018   Traumatic tear of left rotator cuff 05/20/2016   Family history of hypertrophic cardiomyopathy 08-17-14   Family history of sudden cardiac death in son 2014/08/17    PCP: Farris Has, MD  REFERRING  PROVIDER: Huel Cote, MD  REFERRING DIAG: [phone] (ICD-10-CM) - Traumatic complete tear of right rotator cuff, initial encounter  THERAPY DIAG:  Right shoulder pain, unspecified chronicity  Stiffness of right shoulder, not elsewhere classified  Muscle weakness (generalized)  Rationale for Evaluation and Treatment: Rehabilitation  ONSET DATE: 11/23/22 rotator cuff repair, superior capsular reconstruction, acromioplasty  SUBJECTIVE:  SUBJECTIVE STATEMENT: Just stiff in the AM then it gets better.    EVAL: In August pt was working on a ladder, fell backwards but unsure quite how he landed. States he did have a mild concussion but that has since resolved. Previously independent and active for work. Since surgery on 11/23/22, has been receiving assistance from wife and kids for daily tasks. Sleeping in recliner with sling and pillow under arm. Icing 3-4x/day ~59min.  Hand dominance: Right  PERTINENT HISTORY: BIL traumatic RC tears, anxiety, DM, GERD, HTN H/O Lt RCR 2018  Recent note : MD  01/05/23: 6-week status post right shoulder superior capsular reconstruction overall doing very well.  At this time I discussed I would like him to continue through the active range of motion protocol and to work on overhead active range of motion.  Overall he is doing quite well for this stage.  I will plan to see him back in 6 weeks for reassessment   PAIN:  Are you having pain: 0/10 today  Location/description: occasional burning and popping; R shoulder, superiorly Best-worst over past week: 0-7/10  - aggravating factors: sleeping and mornings,  - Easing factors: sling, medication    PRECAUTIONS: s/p R RCR 11/23/22; using Dr. Steward Drone rotator cuff repair protocol per op note   WEIGHT BEARING RESTRICTIONS: Yes NWB  surgical limb  FALLS:  Has patient fallen in last 6 months? Yes. 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 Lifts and carries up to ~50#  PLOF: Independent  PATIENT GOALS: get back to playing golf, shooting pool  NEXT MD VISIT: Nov 18th , Feb 12   OBJECTIVE:  Note: Objective measures were completed at Evaluation unless otherwise noted.  DIAGNOSTIC FINDINGS:  S/p shoulder surgery 11/23/22 Panel 1: Right RIGHT SHOULDER ARTHROSCOPY WITH SUPERIOR CAPSULAR RECONSTRUCTION / EXTENSIVE DEBRIDEMENT with Huel Cote, MD  Panel 1: Right RIGHT SHOULDER ACROMIOPLASTY with Huel Cote, MD   PATIENT SURVEYS:  FOTO 29>64 predicted Visit 8 , 12/23/22 : 56%  Visit 16: 01/27/23:    COGNITION: Overall cognitive status: Within functional limits for tasks assessed     SENSATION/OBSERVATION: Surgical site obscured by bandaging Pt denies any sensory complaints since nerve block wore off  POSTURE: Sling RUE, increased R UT elevation  UPPER EXTREMITY ROM:  ROM Right Eval NT Right 12/01/22 Rt  12/21/22 Rt. 01/04/23 RT 01/10/23 RT 01/25/23 RT  02/01/23  Shoulder flexion  115 AAROM 137 deg  AROM 155 supine Standing 70 deg with shoulder hike   90 deg standing  90 with compensations  Shoulder abduction  80 PROM 100 deg  AROM 126 sidelying      Shoulder internal rotation   AROM FR to Rt lumbar L3 Functional reach  L1-2     Shoulder external rotation  18 PROM 45 AROM 40  AROM at 30 deg 47 deg  FR to right top of head     Elbow flexion         Elbow extension         Wrist flexion         Wrist extension          (Blank rows = not tested) (Key: WFL =  within functional limits  not formally assessed, * = concordant pain, s = stiffness/stretching sensation, NT = not tested)  Comments: deferred given acuity of surgery, phase 1 on eval, elbow flex/ext AAROM grossly WNL  UPPER EXTREMITY MMT:  MMT Right eval R 12/27/22  Rt. 01/27/23  Shoulder flexion   3-/5  Shoulder extension     Shoulder abduction   3-/5  Shoulder extension     Shoulder internal rotation  5 5/5  Shoulder external rotation  4- 4/5  Elbow flexion     Elbow extension     Grip strength     (Blank rows = not tested)  (Key: WFL = within functional limits not formally assessed, * = concordant pain, s = stiffness/stretching sensation, NT = not tested)  Comments: deferred given acuity of surgery  SHOULDER SPECIAL TESTS: Deferred given acuity of surgery  JOINT MOBILITY TESTING:  Deferred given acuity of surgery    TREATMENT:          [facility] Adult PT Treatment:                                                DATE: 02/15/23 Therapeutic Exercise: Pulleys flexion and scaption Supine Rt UE flexion AAROM Yellow  looped band ER pulse then narrow grip overhead  2-3 lbs long arm flexion  x 10 x 3 (sets 2-3 are 3 lbs)  3 lbs chest press  Circles 3 lbs x 20  Sidelying scaption green band x 2 x 10  Scaption 2 lbs 2 x 10 Reverse fly 2 lbs x 10  Flexion 2 lbs 2 x 10  Lat pull double green Rt UE x 2 x 10  ER green band x 10 added sidestepping ER with flexion to 90, IR with flexion - green band  Manual Therapy: PROM all planes    [facility] Adult PT Treatment:                                                DATE: 02/10/23 Therapeutic Exercise: UBE L3  3 min each way  Pulleys flexion and scaption Supine flexion 2# 10 x 3 Supine short lever 3# flexion 10 x 3 45 degree Inclined long lever 2# flexion 5 x 2 , 1# 5  x 2  40 deg inclined Right short lever flexion 3#  x 6 reps x   3 sets  40 degree incline horiz abdct red band 8 x 2  40 degree incline bilat ER red band 10 x 3  40 degree  incline Right IR GTB 10 x  2 S/L shoulder abduction 2# 2 x 10      [facility] Adult PT Treatment:                                                DATE: 02/08/23 Therapeutic Exercise: UBE L3 3 min forward and 3 min back  Supine dowel A/AROM, 2.5 # x 15 overhead and chest press  40 deg Inclined Rt UE flexion x 10 , 2 lbs x 10  40 deg Inclined Dowel flexion pullovers from lap 2 x 10, added  2.5 lbs  40 deg inclined Right short lever flexion x 6- 10 - 2 lbs x  3 sets  Standing flexion to 60 deg 2 lbs  Standing Green circle band chest press x 10 , then 0-45 deg , another set with bent elbows  ER pulse at neutral x 30 sec Seated flexion on table   Used blue band for self traction in extension, flexion, abduction, adduction Active adduction with correction for Rt GH jt.  Rhomboid stretch Red ball rolls 1 min each direction  Manual Therapy: PROM all planes Rt UE with prolonged hold into abduction, distraction  Modalities: Cold pack 10 min   [facility] Adult PT Treatment:                                                DATE: 02/03/23 Therapeutic Exercise: UBE L2 2 min each way  Pulleys flex and scap 60 deg Inclined 7 reps AROM flexion- fatigue 60 deg Inclined Dowel flexion pullovers from lap 2 x 10  60 deg inclined Right short lever flexion x 6 - fatigued  Supine shoulder flexion AROM x 10, #1  10 x 2  S/L shoulder abduction  1# 2 x10 S/L shoulder ER AROM 1# 2 x 10 Prone AROM ext x 10, 1# x 10  Prone horiz abdct AROM 10 x 2 (neutral and ER)  Prone Row AROM x 10, 1# x 10     [facility] Adult PT Treatment:                                                DATE: 02/01/23 Therapeutic Exercise: Pulleys flexion and scaption Supine Shoulder flexion x 10 50 deg inclined shoulder flexion 10 x 2  Supine shoulder iso IR and ER with manual resist from PTA S/L shoulder abduction AROM x 10, 1# x10 S/L shoulder flexion AROM x 10, 1# x 10  S/L shoulder ER AROM x 10 , 1# x 10 Supine 1# protraction x 10  Supine  rhythmic stabilization at 90  Manual Therapy: PROM all planes      [facility] Adult PT Treatment:                                                DATE: 01/27/23 Therapeutic Exercise: UBE 5 min L 1 for 5 min  Supine chest press 4 lbs dowel Supine 0-140 with weight on dowel x 15, then 0-90 deg x 10  Scapular protraction/retraction  4 lbs dowel 2 x 10  Isometric flex, ER, IR, ext 5 sec x 10 each, done with PT x 5 first then standing near wall  Added longer lever flexion/abduction  AAROM wall slides flexion , unable to do abduction  Seated chest press green band with lats active x 2 x 10  Standing black band retraction  x 15  Standing adduction green band x 15  Manual Therapy: PROM all planes  [facility] Adult PT Treatment:  DATE: 01/25/23 Therapeutic Exercise: Pulleys for flexion and scaption  Side lying abduction, flexion and horizontal abduction Supine protraction AROM 2 x 10 Supine long lever AROM flexion 2 x 10 Inclined long lever AROM x 10- more difficult  Prone horiz abdct 10 x 2  Prone row 10 x 2  Isometric flex, ER, IR, ext 5 sec x 10 each  GTB row  Manual Therapy: PROM, side lying scap mobs, TPR upper trap/ levator      [facility] Adult PT Treatment:                                                DATE: 01/14/23 Therapeutic Exercise: Pulleys flexion, scaption, internal rotation Standing dowel overhead lift , cross body reaching , abduction, extension  Extension/IR dowel  Red ball rolls 110 deg 1 min each direction Doorway/corner stretch 30 sec  x 5  Supine dowel flexion full ROM  Cactus Arms ER/IR  Manual Therapy: PROM all ranges to tolerance       PATIENT EDUCATION: Education details: Pt education on PT impairments, prognosis, and POC. Informed consent. Rationale for interventions, safe/appropriate HEP performance Person educated: Patient Education method: Explanation, Demonstration, Tactile cues, Verbal cues, and  Handouts Education comprehension: verbalized understanding, returned demonstration, verbal cues required, tactile cues required, and needs further education    HOME EXERCISE PROGRAM:  Access Code: [email or web address] URL: [email or web address] Date: 01/14/2023 Prepared by: Karie Mainland  Program Notes - with seated elbow flexion, work on gently straightening and bending the elbow with the support of the other arm  Exercises - Seated Elbow Extension with Self-Anchored Resistance  - 1 x daily - 7 x weekly - 2 sets - 10 reps - 5 hold - Standing Elbow Flexion with Resistance  - 1 x daily - 7 x weekly - 2 sets - 10 reps - 5 hold - Standing Shoulder External Rotation Stretch in Doorway  - 1 x daily - 7 x weekly - 2 sets - 10 reps - 10-20 hold - Standing Shoulder Abduction AAROM with Dowel  - 1 x daily - 7 x weekly - 2 sets - 10 reps - 10 hold - Standing Shoulder Extension with Dowel  - 1 x daily - 7 x weekly - 2 sets - 10 reps - 10 hold - Standing Shoulder Flexion AAROM with Dowel  - 1 x daily - 7 x weekly - 2 sets - 10 reps - 10 hold - Standing Shoulder Internal Rotation AAROM with Dowel  - 1 x daily - 7 x weekly - 2 sets - 10 reps - 10 hold - Seated Scapular Retraction  - 1 x daily - 7 x weekly - 2 sets - 10 reps - 10 hold - Corner Pec Major Stretch  - 1-2 x daily - 7 x weekly - 1 sets - 5 reps - 30 hold Added 4 way isometrics   ASSESSMENT:  CLINICAL IMPRESSION:  Patient has been working on strengthening in reclined positions.  He continues to have moderate difficultly avoiding compensations with R upper trap. He will cont to benefit from skilled PT for maximizing functional use of his Rt UE.  He sees MD in a couple weeks and is hoping to get back to work soon, even at [employer] duty.   OBJECTIVE IMPAIRMENTS: decreased activity tolerance, decreased endurance, decreased mobility, decreased ROM, decreased strength, impaired perceived functional ability,  impaired flexibility, impaired UE  functional use, postural dysfunction, and pain.   ACTIVITY LIMITATIONS: carrying, lifting, bending, sleeping, transfers, bathing, toileting, dressing, self feeding, reach over head, and hygiene/grooming  PARTICIPATION LIMITATIONS: meal prep, cleaning, laundry, driving, shopping, community activity, occupation, and yard work  PERSONAL FACTORS: Time since onset of injury/illness/exacerbation and 3+ comorbidities: anxiety, DM, HTN  are also affecting patient's functional outcome.   REHAB POTENTIAL: Good  CLINICAL DECISION MAKING: Stable/uncomplicated  EVALUATION COMPLEXITY: Low   GOALS: Goals reviewed with patient? No  SHORT TERM GOALS: Target date: 01/06/2023 Pt will demonstrate appropriate understanding and performance of initially prescribed HEP in order to facilitate improved independence with management of symptoms.  Baseline: HEP provided on eval Goal status:MET   2. Pt will score greater than or equal to 45 on FOTO in order to demonstrate improved perception of function due to symptoms.  Baseline: 29  Goal status: MET  LONG TERM GOALS: Target date: 02/17/2023   Pt will score 64 on FOTO in order to demonstrate improved perception of function due to symptoms. Baseline: 29 Goal status: ongoing   2.  Pt will demonstrate at least 150 degrees of active shoulder elevation in order to demonstrate improved tolerance to functional movement patterns such as overhead reaching. Baseline: deferred on eval given acuity of surgery Goal status: MET   3.  Pt will demonstrate at least 4+/5 shoulder flex/abduction MMT for improved symmetry of UE strength and improved tolerance to functional movements.  Baseline: deferred on eval given acuity of surgery Goal status: ongoing   4. Pt will report/demonstrate ability to perform upper body dressing with less than 2 point increase in pain on NPS in order to indicate improved tolerance/independence to ADLs.  Baseline: unable to perform, requiring  family assist  Goal status: INITIAL   5. Pt will be able to lift at least 40# from floor to waist with less than 2 pt increase in pain in order to facilitate improved tolerance to work tasks.  Baseline: unable  Goal status: INITIAL  PLAN:  PT FREQUENCY: 2x/week  PT DURATION: 12 weeks  PLANNED INTERVENTIONS: 97164- PT Re-evaluation, 97110-Therapeutic exercises, 97530- Therapeutic activity, 97112- Neuromuscular re-education, 97535- Self Care, 14782- Manual therapy, 97014- Electrical stimulation (unattended), 97016- Vasopneumatic device, Patient/Family education, Balance training, Stair training, Taping, Dry Needling, Joint mobilization, Spinal mobilization, Scar mobilization, Cryotherapy, and Moist heat  PLAN FOR NEXT SESSION:  Begin Strength 12/31. Progress AAROM to AROM.  Manual therapy, scapular retraction, posture : STRENGTHEN IN SUPINE AND RECLINED, PROGRESSING TO UPRIGHT.  Karie Mainland, PT 02/15/23 1:40 PM Phone: [phone] Fax: [phone]

## 2023-02-15 ENCOUNTER — Ambulatory Visit: Payer: 59 | Admitting: Physical Therapy

## 2023-02-15 ENCOUNTER — Encounter: Payer: Self-pay | Admitting: Physical Therapy

## 2023-02-15 DIAGNOSIS — M6281 Muscle weakness (generalized): Secondary | ICD-10-CM

## 2023-02-15 DIAGNOSIS — M25511 Pain in right shoulder: Secondary | ICD-10-CM

## 2023-02-15 DIAGNOSIS — M25611 Stiffness of right shoulder, not elsewhere classified: Secondary | ICD-10-CM

## 2023-02-17 ENCOUNTER — Ambulatory Visit: Payer: 59 | Admitting: Physical Therapy

## 2023-02-17 ENCOUNTER — Encounter: Payer: Self-pay | Admitting: Physical Therapy

## 2023-02-17 DIAGNOSIS — M6281 Muscle weakness (generalized): Secondary | ICD-10-CM

## 2023-02-17 DIAGNOSIS — M25511 Pain in right shoulder: Secondary | ICD-10-CM

## 2023-02-17 DIAGNOSIS — M25611 Stiffness of right shoulder, not elsewhere classified: Secondary | ICD-10-CM

## 2023-02-17 NOTE — Therapy (Signed)
OUTPATIENT PHYSICAL THERAPY SHOULDER TREATMENT   Patient Name: ROSE HIPPLER MRN: [id number] DOB:20-Mar-1975, 48 y.o., male Today's Date: 02/17/2023  END OF SESSION:  PT End of Session - 02/17/23 1015     Visit Number 22    Number of Visits 25    Date for PT Re-Evaluation 02/18/23    Authorization Type [employer]    Authorization Time Period 60VL no auth required    Progress Note Due on Visit 10    PT Start Time 1015    PT Stop Time 1058    PT Time Calculation (min) 43 min                     Past Medical History:  Diagnosis Date   Anxiety    Diabetes mellitus without complication (HCC)    GERD (gastroesophageal reflux disease)    Hypertension    Obesity (BMI 30-39.9)    Plantar fasciitis of right foot    Skin lesion    Tendonitis    Traumatic tear of left rotator cuff 05/20/2016   Past Surgical History:  Procedure Laterality Date   SHOULDER ACROMIOPLASTY Right 11/23/2022   Procedure: RIGHT SHOULDER ACROMIOPLASTY;  Surgeon: Huel Cote, MD;  Location: [facility];  Service: Orthopedics;  Laterality: Right;   SHOULDER ARTHROSCOPY WITH ROTATOR CUFF REPAIR AND SUBACROMIAL DECOMPRESSION Left 05/21/2016   Procedure: SHOULDER ARTHROSCOPY WITH ROTATOR CUFF REPAIR AND SUBACROMIAL DECOMPRESSION LABRAL DEBRIDEMENT;  Surgeon: Salvatore Marvel, MD;  Location: [facility];  Service: Orthopedics;  Laterality: Left;   VASECTOMY     Patient Active Problem List   Diagnosis Date Noted   Traumatic complete tear of right rotator cuff 11/23/2022   Lab test positive for detection of COVID-19 virus 02/23/2022   Paresthesia 10/23/2018   Traumatic tear of left rotator cuff 05/20/2016   Family history of hypertrophic cardiomyopathy 2014-08-10   Family history of sudden cardiac death in son Aug 10, 2014    PCP: Farris Has, MD  REFERRING PROVIDER: Huel Cote, MD  REFERRING DIAG: [phone] (ICD-10-CM) - Traumatic complete tear of right rotator cuff,  initial encounter  THERAPY DIAG:  Right shoulder pain, unspecified chronicity  Stiffness of right shoulder, not elsewhere classified  Muscle weakness (generalized)  Rationale for Evaluation and Treatment: Rehabilitation  ONSET DATE: 11/23/22 rotator cuff repair, superior capsular reconstruction, acromioplasty  SUBJECTIVE:                                                                                                                                                                                      SUBJECTIVE STATEMENT: Not as stiff this morning. I was able to use arm to  wash my hair this morning.   EVAL: In August pt was working on a ladder, fell backwards but unsure quite how he landed. States he did have a mild concussion but that has since resolved. Previously independent and active for work. Since surgery on 11/23/22, has been receiving assistance from wife and kids for daily tasks. Sleeping in recliner with sling and pillow under arm. Icing 3-4x/day ~43min.  Hand dominance: Right  PERTINENT HISTORY: BIL traumatic RC tears, anxiety, DM, GERD, HTN H/O Lt RCR 2018  Recent note : MD  01/05/23: 6-week status post right shoulder superior capsular reconstruction overall doing very well.  At this time I discussed I would like him to continue through the active range of motion protocol and to work on overhead active range of motion.  Overall he is doing quite well for this stage.  I will plan to see him back in 6 weeks for reassessment   PAIN:  Are you having pain: 0/10 today  Location/description: occasional burning and popping; R shoulder, superiorly Best-worst over past week: 0-7/10  - aggravating factors: sleeping and mornings,  - Easing factors: sling, medication    PRECAUTIONS: s/p R RCR 11/23/22; using Dr. Steward Drone rotator cuff repair protocol per op note   WEIGHT BEARING RESTRICTIONS: Yes NWB surgical limb  FALLS:  Has patient fallen in last 6 months? Yes. 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 Lifts and carries up to ~50#  PLOF: Independent  PATIENT GOALS: get back to playing golf, shooting pool  NEXT MD VISIT: Nov 18th , Feb 12   OBJECTIVE:  Note: Objective measures were completed at Evaluation unless otherwise noted.  DIAGNOSTIC FINDINGS:  S/p shoulder surgery 11/23/22 Panel 1: Right RIGHT SHOULDER ARTHROSCOPY WITH SUPERIOR CAPSULAR RECONSTRUCTION / EXTENSIVE DEBRIDEMENT with Huel Cote, MD  Panel 1: Right RIGHT SHOULDER ACROMIOPLASTY with Huel Cote, MD   PATIENT SURVEYS:  FOTO 29>64 predicted Visit 8 , 12/23/22 : 56%  Visit 16: 01/27/23:    COGNITION: Overall cognitive status: Within functional limits for tasks assessed     SENSATION/OBSERVATION: Surgical site obscured by bandaging Pt denies any sensory complaints since nerve block wore off  POSTURE: Sling RUE, increased R UT elevation  UPPER EXTREMITY ROM:  ROM Right Eval NT Right 12/01/22 Rt  12/21/22 Rt. 01/04/23 RT 01/10/23 RT 01/25/23 RT  02/01/23 RT 02/17/23  Shoulder flexion  115 AAROM 137 deg  AROM 155 supine Standing 70 deg with shoulder hike   90 deg standing  90 with compensations 155 with compensation  Shoulder abduction  80 PROM 100 deg  AROM 126 sidelying     105 with compensation  Shoulder internal rotation   AROM FR to Rt lumbar L3 Functional reach  L1-2      Shoulder external rotation  18 PROM 45 AROM 40  AROM at 30 deg 47 deg  FR to right top of head      Elbow flexion          Elbow extension          Wrist flexion          Wrist extension           (Blank rows = not tested) (  Key: WFL = within functional limits  not formally assessed, * = concordant pain, s = stiffness/stretching sensation, NT = not tested)  Comments: deferred given acuity of surgery, phase 1 on eval, elbow flex/ext AAROM grossly WNL  UPPER EXTREMITY MMT:  MMT Right eval R 12/27/22  Rt. 01/27/23  Shoulder flexion   3-/5  Shoulder extension     Shoulder abduction   3-/5  Shoulder extension     Shoulder internal rotation  5 5/5  Shoulder external rotation  4- 4/5  Elbow flexion     Elbow extension     Grip strength     (Blank rows = not tested)  (Key: WFL = within functional limits not formally assessed, * = concordant pain, s = stiffness/stretching sensation, NT = not tested)  Comments: deferred given acuity of surgery  SHOULDER SPECIAL TESTS: Deferred given acuity of surgery  JOINT MOBILITY TESTING:  Deferred given acuity of surgery    TREATMENT:         [facility] Adult PT Treatment:                                                DATE: 02/17/23 Therapeutic Exercise: UBE L3 Supine long lever flexion 3# 10 x 3 Supine rhythmic stab at 90 flex  Supine GTB horiz abdct 2 x 15  Shoulder ER bilat supine 2 x 15  S/L shoulder abduction 3# 2 x 10 Sl/L ER 3# 10 x 3 Incline 40 deg shoulder long lever flexion 2# x 8, 2# x 10 Incline 60 AROM x 10 long lever flexion  x 8  Standing R IR GTB x 20 Prone 2# ext x10, 3# x 10 Prone horiz abdct 2# 10 x 2  Standing Row Black band x 20    [facility] Adult PT Treatment:                                                DATE: 02/15/23 Therapeutic Exercise: Pulleys flexion and scaption Supine Rt UE flexion AAROM Yellow  looped band ER pulse then narrow grip overhead  2-3 lbs long arm flexion  x 10 x 3 (sets 2-3 are 3 lbs)  3 lbs chest press  Circles 3 lbs x 20  Sidelying scaption green band x 2 x 10  Scaption 2 lbs 2 x 10 Reverse fly 2 lbs x 10  Flexion 2 lbs 2 x 10  Lat pull double green Rt UE x 2 x 10  ER green band x 10 added sidestepping ER with flexion to 90, IR with flexion - green band   Manual Therapy: PROM all planes    [facility] Adult PT Treatment:                                                DATE: 02/10/23 Therapeutic Exercise: UBE L3  3 min each way  Pulleys flexion and scaption Supine flexion 2# 10 x 3 Supine short lever 3# flexion 10 x 3 45 degree Inclined long lever 2# flexion 5 x 2 , 1# 5  x 2  40 deg inclined  Right short lever flexion 3#  x 6 reps x   3 sets  40 degree incline horiz abdct red band 8 x 2  40 degree incline bilat ER red band 10 x 3  40 degree incline Right IR GTB 10 x  2 S/L shoulder abduction 2# 2 x 10      [facility] Adult PT Treatment:                                                DATE: 02/08/23 Therapeutic Exercise: UBE L3 3 min forward and 3 min back  Supine dowel A/AROM, 2.5 # x 15 overhead and chest press  40 deg Inclined Rt UE flexion x 10 , 2 lbs x 10  40 deg Inclined Dowel flexion pullovers from lap 2 x 10, added 2.5 lbs  40 deg inclined Right short lever flexion x 6- 10 - 2 lbs x  3 sets  Standing flexion to 60 deg 2 lbs  Standing Green circle band chest press x 10 , then 0-45 deg , another set with bent elbows  ER pulse at neutral x 30 sec Seated flexion on table   Used blue band for self traction in extension, flexion, abduction, adduction Active adduction with correction for Rt GH jt.  Rhomboid stretch Red ball rolls 1 min each direction  Manual Therapy: PROM all planes Rt UE with prolonged hold into abduction, distraction  Modalities: Cold pack 10 min   [facility] Adult PT Treatment:                                                DATE: 02/03/23 Therapeutic Exercise: UBE L2 2 min each way  Pulleys flex and scap 60 deg Inclined 7 reps AROM flexion- fatigue 60 deg Inclined Dowel flexion pullovers from lap 2 x 10  60 deg inclined Right short lever flexion x 6 - fatigued  Supine shoulder flexion AROM x 10, #1  10 x 2  S/L shoulder abduction  1# 2 x10 S/L shoulder ER AROM 1# 2 x 10 Prone AROM ext x 10, 1# x 10  Prone horiz abdct  AROM 10 x 2 (neutral and ER)  Prone Row AROM x 10, 1# x 10     [facility] Adult PT Treatment:                                                DATE: 02/01/23 Therapeutic Exercise: Pulleys flexion and scaption Supine Shoulder flexion x 10 50 deg inclined shoulder flexion 10 x 2  Supine shoulder iso IR and ER with manual resist from PTA S/L shoulder abduction AROM x 10, 1# x10 S/L shoulder flexion AROM x 10, 1# x 10  S/L shoulder ER AROM x 10 , 1# x 10 Supine 1# protraction x 10  Supine rhythmic stabilization at 90  Manual Therapy: PROM all planes      [facility] Adult PT Treatment:  DATE: 01/27/23 Therapeutic Exercise: UBE 5 min L 1 for 5 min  Supine chest press 4 lbs dowel Supine 0-140 with weight on dowel x 15, then 0-90 deg x 10  Scapular protraction/retraction  4 lbs dowel 2 x 10  Isometric flex, ER, IR, ext 5 sec x 10 each, done with PT x 5 first then standing near wall  Added longer lever flexion/abduction  AAROM wall slides flexion , unable to do abduction  Seated chest press green band with lats active x 2 x 10  Standing black band retraction  x 15  Standing adduction green band x 15  Manual Therapy: PROM all planes  [facility] Adult PT Treatment:                                                DATE: 01/25/23 Therapeutic Exercise: Pulleys for flexion and scaption  Side lying abduction, flexion and horizontal abduction Supine protraction AROM 2 x 10 Supine long lever AROM flexion 2 x 10 Inclined long lever AROM x 10- more difficult  Prone horiz abdct 10 x 2  Prone row 10 x 2  Isometric flex, ER, IR, ext 5 sec x 10 each  GTB row  Manual Therapy: PROM, side lying scap mobs, TPR upper trap/ levator      [facility] Adult PT Treatment:                                                DATE: 01/14/23 Therapeutic Exercise: Pulleys flexion, scaption, internal rotation Standing dowel overhead lift , cross body reaching , abduction, extension   Extension/IR dowel  Red ball rolls 110 deg 1 min each direction Doorway/corner stretch 30 sec  x 5  Supine dowel flexion full ROM  Cactus Arms ER/IR  Manual Therapy: PROM all ranges to tolerance       PATIENT EDUCATION: Education details: Pt education on PT impairments, prognosis, and POC. Informed consent. Rationale for interventions, safe/appropriate HEP performance Person educated: Patient Education method: Explanation, Demonstration, Tactile cues, Verbal cues, and Handouts Education comprehension: verbalized understanding, returned demonstration, verbal cues required, tactile cues required, and needs further education    HOME EXERCISE PROGRAM:  Access Code: [email or web address] URL: [email or web address] Date: 01/14/2023 Prepared by: Karie Mainland  Program Notes - with seated elbow flexion, work on gently straightening and bending the elbow with the support of the other arm  Exercises - Seated Elbow Extension with Self-Anchored Resistance  - 1 x daily - 7 x weekly - 2 sets - 10 reps - 5 hold - Standing Elbow Flexion with Resistance  - 1 x daily - 7 x weekly - 2 sets - 10 reps - 5 hold - Standing Shoulder External Rotation Stretch in Doorway  - 1 x daily - 7 x weekly - 2 sets - 10 reps - 10-20 hold - Standing Shoulder Abduction AAROM with Dowel  - 1 x daily - 7 x weekly - 2 sets - 10 reps - 10 hold - Standing Shoulder Extension with Dowel  - 1 x daily - 7 x weekly - 2 sets - 10 reps - 10 hold - Standing Shoulder Flexion AAROM with Dowel  - 1 x daily - 7 x weekly - 2 sets - 10  reps - 10 hold - Standing Shoulder Internal Rotation AAROM with Dowel  - 1 x daily - 7 x weekly - 2 sets - 10 reps - 10 hold - Seated Scapular Retraction  - 1 x daily - 7 x weekly - 2 sets - 10 reps - 10 hold - Corner Pec Major Stretch  - 1-2 x daily - 7 x weekly - 1 sets - 5 reps - 30 hold Added 4 way isometrics   ASSESSMENT:  CLINICAL IMPRESSION:  Patient has been working on strengthening in  reclined positions using 2.8 lb can. Today he reports he was able to use RUE to assist in washing hair. He also was able to reach [facility] to 155 with shoulder hike.   He continues to have moderate difficultly avoiding compensations with R upper trap. He will cont to benefit from skilled PT for maximizing functional use of his Rt UE.  He sees MD in a couple weeks and is hoping to get back to work soon, even at [employer] duty.   OBJECTIVE IMPAIRMENTS: decreased activity tolerance, decreased endurance, decreased mobility, decreased ROM, decreased strength, impaired perceived functional ability, impaired flexibility, impaired UE functional use, postural dysfunction, and pain.   ACTIVITY LIMITATIONS: carrying, lifting, bending, sleeping, transfers, bathing, toileting, dressing, self feeding, reach over head, and hygiene/grooming  PARTICIPATION LIMITATIONS: meal prep, cleaning, laundry, driving, shopping, community activity, occupation, and yard work  PERSONAL FACTORS: Time since onset of injury/illness/exacerbation and 3+ comorbidities: anxiety, DM, HTN  are also affecting patient's functional outcome.   REHAB POTENTIAL: Good  CLINICAL DECISION MAKING: Stable/uncomplicated  EVALUATION COMPLEXITY: Low   GOALS: Goals reviewed with patient? No  SHORT TERM GOALS: Target date: 01/06/2023 Pt will demonstrate appropriate understanding and performance of initially prescribed HEP in order to facilitate improved independence with management of symptoms.  Baseline: HEP provided on eval Goal status:MET   2. Pt will score greater than or equal to 45 on FOTO in order to demonstrate improved perception of function due to symptoms.  Baseline: 29  Goal status: MET  LONG TERM GOALS: Target date: 02/17/2023   Pt will score 64 on FOTO in order to demonstrate improved perception of function due to symptoms. Baseline: 29 Goal status: ongoing   2.  Pt will demonstrate at least 150 degrees of active shoulder elevation  in order to demonstrate improved tolerance to functional movement patterns such as overhead reaching. Baseline: deferred on eval given acuity of surgery Goal status: MET   3.  Pt will demonstrate at least 4+/5 shoulder flex/abduction MMT for improved symmetry of UE strength and improved tolerance to functional movements.  Baseline: deferred on eval given acuity of surgery Goal status: ongoing   4. Pt will report/demonstrate ability to perform upper body dressing with less than 2 point increase in pain on NPS in order to indicate improved tolerance/independence to ADLs.  Baseline: unable to perform, requiring family assist  Goal status: INITIAL   5. Pt will be able to lift at least 40# from floor to waist with less than 2 pt increase in pain in order to facilitate improved tolerance to work tasks.  Baseline: unable  Goal status: INITIAL  PLAN:  PT FREQUENCY: 2x/week  PT DURATION: 12 weeks  PLANNED INTERVENTIONS: 97164- PT Re-evaluation, 97110-Therapeutic exercises, 97530- Therapeutic activity, 97112- Neuromuscular re-education, 97535- Self Care, 21308- Manual therapy, 97014- Electrical stimulation (unattended), 97016- Vasopneumatic device, Patient/Family education, Balance training, Stair training, Taping, Dry Needling, Joint mobilization, Spinal mobilization, Scar mobilization, Cryotherapy, and Moist heat  PLAN FOR NEXT SESSION:  Begin Strength 12/31. Progress AAROM to AROM.  Manual therapy, scapular retraction, posture : STRENGTHEN IN SUPINE AND RECLINED, PROGRESSING TO UPRIGHT.   Jannette Spanner, PTA 02/17/23 10:59 AM Phone: [phone] Fax: [phone]

## 2023-02-22 ENCOUNTER — Ambulatory Visit: Payer: 59 | Attending: Orthopaedic Surgery | Admitting: Physical Therapy

## 2023-02-22 ENCOUNTER — Encounter: Payer: Self-pay | Admitting: Physical Therapy

## 2023-02-22 DIAGNOSIS — M25611 Stiffness of right shoulder, not elsewhere classified: Secondary | ICD-10-CM | POA: Insufficient documentation

## 2023-02-22 DIAGNOSIS — M25511 Pain in right shoulder: Secondary | ICD-10-CM | POA: Diagnosis present

## 2023-02-22 DIAGNOSIS — M6281 Muscle weakness (generalized): Secondary | ICD-10-CM | POA: Diagnosis present

## 2023-02-22 NOTE — Therapy (Signed)
 OUTPATIENT PHYSICAL THERAPY SHOULDER TREATMENT/RE-EVALUATION   Patient Name: [name] [name] MRN: [id number] DOB:1975-09-28, 48 y.o., male Today's Date: 02/22/2023  END OF SESSION:  PT End of Session - 02/22/23 1023     Visit Number 23    Number of Visits 39    Date for PT Re-Evaluation 04/19/23    Authorization Type [employer]    Authorization Time Period 60VL no auth required    PT Start Time 1020    PT Stop Time 1104    PT Time Calculation (min) 44 min    Activity Tolerance Patient tolerated treatment well    Behavior During Therapy WFL for tasks assessed/performed              Past Medical History:  Diagnosis Date   Anxiety    Diabetes mellitus without complication (HCC)    GERD (gastroesophageal reflux disease)    Hypertension    Obesity (BMI 30-39.9)    Plantar fasciitis of right foot    Skin lesion    Tendonitis    Traumatic tear of left rotator cuff 05/20/2016   Past Surgical History:  Procedure Laterality Date   SHOULDER ACROMIOPLASTY Right 11/23/2022   Procedure: RIGHT SHOULDER ACROMIOPLASTY;  Surgeon: Genelle Standing, MD;  Location: [facility];  Service: Orthopedics;  Laterality: Right;   SHOULDER ARTHROSCOPY WITH ROTATOR CUFF REPAIR AND SUBACROMIAL DECOMPRESSION Left 05/21/2016   Procedure: SHOULDER ARTHROSCOPY WITH ROTATOR CUFF REPAIR AND SUBACROMIAL DECOMPRESSION LABRAL DEBRIDEMENT;  Surgeon: Jane Charleston, MD;  Location: [facility];  Service: Orthopedics;  Laterality: Left;   VASECTOMY     Patient Active Problem List   Diagnosis Date Noted   Traumatic complete tear of right rotator cuff 11/23/2022   Lab test positive for detection of COVID-19 virus 02/23/2022   Paresthesia 10/23/2018   Traumatic tear of left rotator cuff 05/20/2016   Family history of hypertrophic cardiomyopathy 08-08-2014   Family history of sudden cardiac death in son 2014-08-08    PCP: Kip Righter, MD  REFERRING PROVIDER: Genelle Standing,  MD  REFERRING DIAG: [phone] (ICD-10-CM) - Traumatic complete tear of right rotator cuff, initial encounter  THERAPY DIAG:  Right shoulder pain, unspecified chronicity  Stiffness of right shoulder, not elsewhere classified  Muscle weakness (generalized)  Rationale for Evaluation and Treatment: Rehabilitation  ONSET DATE: 11/23/22 rotator cuff repair, superior capsular reconstruction, acromioplasty  SUBJECTIVE:                                                                                                                                                                                      SUBJECTIVE STATEMENT: Arm is doing well, no pain at rest.  EVAL: In August pt was working on a ladder, fell backwards but unsure quite how he landed. States he did have a mild concussion but that has since resolved. Previously independent and active for work. Since surgery on 11/23/22, has been receiving assistance from wife and kids for daily tasks. Sleeping in recliner with sling and pillow under arm. Icing 3-4x/day ~54min.  Hand dominance: Right  PERTINENT HISTORY: BIL traumatic RC tears, anxiety, DM, GERD, HTN H/O Lt RCR 2018  Recent note : MD  01/05/23: 6-week status post right shoulder superior capsular reconstruction overall doing very well.  At this time I discussed I would like him to continue through the active range of motion protocol and to work on overhead active range of motion.  Overall he is doing quite well for this stage.  I will plan to see him back in 6 weeks for reassessment   PAIN:  Are you having pain: 0/10 today  Location/description: occasional burning and popping; R shoulder, superiorly Best-worst over past week: 0-7/10  - aggravating factors: sleeping and mornings,  - Easing factors: sling, medication    PRECAUTIONS: s/p R RCR 11/23/22; using Dr. Genelle rotator cuff repair protocol per op note   WEIGHT BEARING RESTRICTIONS: Yes NWB surgical limb  FALLS:  Has patient  fallen in last 6 months? Yes. 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 Lifts and carries up to ~50#  PLOF: Independent  PATIENT GOALS: get back to playing golf, shooting pool  NEXT MD VISIT: Nov 18th , Feb 12   OBJECTIVE:  Note: Objective measures were completed at Evaluation unless otherwise noted.  DIAGNOSTIC FINDINGS:  S/p shoulder surgery 11/23/22 Panel 1: Right RIGHT SHOULDER ARTHROSCOPY WITH SUPERIOR CAPSULAR RECONSTRUCTION / EXTENSIVE DEBRIDEMENT with Genelle Standing, MD  Panel 1: Right RIGHT SHOULDER ACROMIOPLASTY with Genelle Standing, MD   PATIENT SURVEYS:  FOTO 29>64 predicted Visit 8 , 12/23/22 : 56%  Visit 16: 01/27/23: NA Visit 23: 02/22/23: 58%:    COGNITION: Overall cognitive status: Within functional limits for tasks assessed     SENSATION/OBSERVATION: Surgical site obscured by bandaging Pt denies any sensory complaints since nerve block wore off  POSTURE: Sling RUE, increased R UT elevation  UPPER EXTREMITY ROM:  ROM Right Eval NT Right 12/01/22 RT 01/10/23 RT 01/25/23 RT  02/01/23 RT 02/17/23 Rt. 02/22/23  Shoulder flexion  115  90 deg standing  90 with compensations 155 with compensation  153  Shoulder abduction  80    105 with compensation 100   Shoulder internal rotation       Thumb at T12 hand to lumbar   Shoulder external rotation  18 FR to right top of head     Fingers to T1  Elbow flexion         Elbow extension         Wrist flexion         Wrist extension          (Blank rows = not tested) (Key: WFL = within functional limits not formally assessed, * = concordant pain, s = stiffness/stretching sensation, NT =  not tested)  Comments: deferred given acuity of surgery, phase 1 on eval, elbow flex/ext AAROM grossly WNL  UPPER EXTREMITY MMT:  MMT Right eval R 12/27/22  Rt. 01/27/23 Rt. 02/22/23  Shoulder flexion   3-/5 3/5  Shoulder extension      Shoulder abduction   3-/5 3/5 in available range  Shoulder extension      Shoulder internal rotation  5 5/5 5/5  Shoulder external rotation  4- 4/5 4/5  Elbow flexion      Elbow extension      Grip strength      (Blank rows = not tested)  (Key: WFL = within functional limits not formally assessed, * = concordant pain, s = stiffness/stretching sensation, NT = not tested)  Comments: deferred given acuity of surgery  SHOULDER SPECIAL TESTS: Deferred given acuity of surgery  JOINT MOBILITY TESTING:  Deferred given acuity of surgery    TREATMENT:         [facility] Adult PT Treatment:                                                DATE: 02/22/23 Manual Therapy: PROM all planes, distraction /oscillation Therapeutic Activity: UBE 5 min L3  Pulleys overhead and scaption  Measure AROM and MMT  AROM and strengthening to minimize compensation and increase function Red looped band for serratus activation Wall slides x 10 flexion with lift off  Foam roller longitudinal scapular retract Flexion 2 lbs 2 x 10 on wall/roller  Scaption 2 lbs 2 x 10  Black band row x 15 Shoulder flexion < 20 deg incline 3 lbs chest press x 20 Shoulder flexion to failure 2 lbs x 1 min   [facility] Adult PT Treatment:                                                DATE: 02/17/23 Therapeutic Exercise: UBE L3 Supine long lever flexion 3# 10 x 3 Supine rhythmic stab at 90 flex  Supine GTB horiz abdct 2 x 15  Shoulder ER bilat supine 2 x 15  S/L shoulder abduction 3# 2 x 10 Sl/L ER 3# 10 x 3 Incline 40 deg shoulder long lever flexion 2# x 8, 2# x 10 Incline 60 AROM x 10 long lever flexion  x 8  Standing R IR GTB x 20 Prone 2# ext x10, 3# x 10 Prone horiz abdct 2# 10 x 2  Standing  Row Black band x 20       PATIENT EDUCATION: Education details: Pt education on PT impairments, prognosis, and POC. Informed consent. Rationale for interventions, safe/appropriate HEP performance Person educated: Patient Education method: Explanation, Demonstration, Tactile cues, Verbal cues, and Handouts Education comprehension: verbalized understanding, returned demonstration, verbal cues required, tactile cues required, and needs further education    HOME EXERCISE PROGRAM:  Access Code: [email or web address] URL: [email or web address] Date: 01/14/2023 Prepared by: Delon Norma  Program Notes - with seated elbow flexion, work on gently straightening and bending the elbow with the support of the other arm  Exercises - Seated Elbow Extension with Self-Anchored Resistance  - 1 x daily - 7 x weekly - 2 sets - 10 reps - 5 hold - Standing Elbow Flexion with Resistance  - 1 x daily - 7 x weekly - 2 sets - 10 reps - 5 hold -  Standing Shoulder External Rotation Stretch in Doorway  - 1 x daily - 7 x weekly - 2 sets - 10 reps - 10-20 hold - Standing Shoulder Abduction AAROM with Dowel  - 1 x daily - 7 x weekly - 2 sets - 10 reps - 10 hold - Standing Shoulder Extension with Dowel  - 1 x daily - 7 x weekly - 2 sets - 10 reps - 10 hold - Standing Shoulder Flexion AAROM with Dowel  - 1 x daily - 7 x weekly - 2 sets - 10 reps - 10 hold - Standing Shoulder Internal Rotation AAROM with Dowel  - 1 x daily - 7 x weekly - 2 sets - 10 reps - 10 hold - Seated Scapular Retraction  - 1 x daily - 7 x weekly - 2 sets - 10 reps - 10 hold - Corner Pec Major Stretch  - 1-2 x daily - 7 x weekly - 1 sets - 5 reps - 30 hold Added 4 way isometrics   ASSESSMENT:  CLINICAL IMPRESSION:    Patient has made significant gains in ROM but continues to struggle with strength mostly in abduction and ER.  He continues to have moderate difficultly avoiding compensations with R upper trap.He sees MD next week and will  determine  if he will return to work. His treatment and goals will be more focused on the functional movement patterns he will need to be able to do to do his job successfully. He was able to raise Rt UE overhead in long arm flexion without much incline.  Pain is minimal and transient with > 90 deg motion.   OBJECTIVE IMPAIRMENTS: decreased activity tolerance, decreased endurance, decreased mobility, decreased ROM, decreased strength, impaired perceived functional ability, impaired flexibility, impaired UE functional use, postural dysfunction, and pain.   ACTIVITY LIMITATIONS: carrying, lifting, bending, sleeping, transfers, bathing, toileting, dressing, self feeding, reach over head, and hygiene/grooming  PARTICIPATION LIMITATIONS: meal prep, cleaning, laundry, driving, shopping, community activity, occupation, and yard work  PERSONAL FACTORS: Time since onset of injury/illness/exacerbation and 3+ comorbidities: anxiety, DM, HTN  are also affecting patient's functional outcome.   REHAB POTENTIAL: Good  CLINICAL DECISION MAKING: Stable/uncomplicated  EVALUATION COMPLEXITY: Low   GOALS: Goals reviewed with patient? No  SHORT TERM GOALS: Target date: 01/06/2023 Pt will demonstrate appropriate understanding and performance of initially prescribed HEP in order to facilitate improved independence with management of symptoms.  Baseline: HEP provided on eval Goal status:MET   2. Pt will score greater than or equal to 45 on FOTO in order to demonstrate improved perception of function due to symptoms.  Baseline: 29  Goal status: MET  LONG TERM GOALS: Target date: 02/17/2023   Pt will score 64 on FOTO in order to demonstrate improved perception of function due to symptoms. Baseline: 29, 56%  Goal status: ongoing   2.  Pt will demonstrate at least 150 degrees of active shoulder elevation in order to demonstrate improved tolerance to functional movement patterns such as overhead  reaching. Baseline: deferred on eval given acuity of surgery Goal status: MET   3.  Pt will demonstrate at least 4+/5 shoulder flex/abduction MMT for improved symmetry of UE strength and improved tolerance to functional movements.  Baseline: deferred on eval given acuity of surgery See above  Goal status: ongoing   4. Pt will report/demonstrate ability to perform upper body dressing with less than 2 point increase in pain on NPS in order to indicate improved tolerance/independence to ADLs.  Baseline: unable to perform, requiring family assist  Goal status:MET   5. Pt will be able to lift at least 40# from floor to waist with less than 2 pt increase in pain in order to facilitate improved tolerance to work tasks.  Baseline: unable  Goal status: ongoing   6. Pt will be able to return to work without limitation of pain or functional (driving, pushing and pulling materials 40-50 lbs)   Baseline: may return to work in 1-2 weeks   Goal status: NEW   7. Pt will be able to use his Rt UE while driving, reaching with ease   Baseline: difficulty reaching back and to the radio  Goal status : NEW   PLAN:  PT FREQUENCY: 2x/week  PT DURATION: 8 weeks  PLANNED INTERVENTIONS: 97164- PT Re-evaluation, 97110-Therapeutic exercises, 97530- Therapeutic activity, 97112- Neuromuscular re-education, 97535- Self Care, [id number]- Manual therapy, 97014- Electrical stimulation (unattended), 97016- Vasopneumatic device, Patient/Family education, Balance training, Stair training, Taping, Dry Needling, Joint mobilization, Spinal mobilization, Scar mobilization, Cryotherapy, and Moist heat  PLAN FOR NEXT SESSION:  Progress strength and minimize compensations.  Manual therapy, scapular retraction, posture : STRENGTHEN IN SUPINE AND RECLINED, PROGRESSING TO UPRIGHT.   Delon Norma, PT 02/22/23 12:46 PM Phone: [phone] Fax: [phone]

## 2023-02-23 NOTE — Therapy (Signed)
 OUTPATIENT PHYSICAL THERAPY SHOULDER TREATMENT  Patient Name: [name] [name] MRN: [id number] DOB:Nov 28, 1975, 48 y.o., male Today's Date: 02/24/2023  END OF SESSION:  PT End of Session - 02/24/23 1026     Visit Number 24    Number of Visits 39    Date for PT Re-Evaluation 04/19/23    Authorization Type [employer]    Authorization Time Period 60VL no auth required    Progress Note Due on Visit 20    PT Start Time 1017    PT Stop Time 1100    PT Time Calculation (min) 43 min    Activity Tolerance Patient tolerated treatment well    Behavior During Therapy WFL for tasks assessed/performed               Past Medical History:  Diagnosis Date   Anxiety    Diabetes mellitus without complication (HCC)    GERD (gastroesophageal reflux disease)    Hypertension    Obesity (BMI 30-39.9)    Plantar fasciitis of right foot    Skin lesion    Tendonitis    Traumatic tear of left rotator cuff 05/20/2016   Past Surgical History:  Procedure Laterality Date   SHOULDER ACROMIOPLASTY Right 11/23/2022   Procedure: RIGHT SHOULDER ACROMIOPLASTY;  Surgeon: Genelle Standing, MD;  Location: [facility];  Service: Orthopedics;  Laterality: Right;   SHOULDER ARTHROSCOPY WITH ROTATOR CUFF REPAIR AND SUBACROMIAL DECOMPRESSION Left 05/21/2016   Procedure: SHOULDER ARTHROSCOPY WITH ROTATOR CUFF REPAIR AND SUBACROMIAL DECOMPRESSION LABRAL DEBRIDEMENT;  Surgeon: Jane Charleston, MD;  Location: [facility];  Service: Orthopedics;  Laterality: Left;   VASECTOMY     Patient Active Problem List   Diagnosis Date Noted   Traumatic complete tear of right rotator cuff 11/23/2022   Lab test positive for detection of COVID-19 virus 02/23/2022   Paresthesia 10/23/2018   Traumatic tear of left rotator cuff 05/20/2016   Family history of hypertrophic cardiomyopathy 2014-07-29   Family history of sudden cardiac death in son 2014/07/29    PCP: Kip Righter, MD  REFERRING PROVIDER: Genelle Standing, MD  REFERRING DIAG: [phone] (ICD-10-CM) - Traumatic complete tear of right rotator cuff, initial encounter  THERAPY DIAG:  Right shoulder pain, unspecified chronicity  Stiffness of right shoulder, not elsewhere classified  Muscle weakness (generalized)  Rationale for Evaluation and Treatment: Rehabilitation  ONSET DATE: 11/23/22 rotator cuff repair, superior capsular reconstruction, acromioplasty  SUBJECTIVE:                                                                                                                                                                                      SUBJECTIVE STATEMENT:  Arm is just sore today.   EVAL: In August pt was working on a ladder, fell backwards but unsure quite how he landed. States he did have a mild concussion but that has since resolved. Previously independent and active for work. Since surgery on 11/23/22, has been receiving assistance from wife and kids for daily tasks. Sleeping in recliner with sling and pillow under arm. Icing 3-4x/day ~46min.  Hand dominance: Right  PERTINENT HISTORY: BIL traumatic RC tears, anxiety, DM, GERD, HTN H/O Lt RCR 2018  Recent note : MD  01/05/23: 6-week status post right shoulder superior capsular reconstruction overall doing very well.  At this time I discussed I would like him to continue through the active range of motion protocol and to work on overhead active range of motion.  Overall he is doing quite well for this stage.  I will plan to see him back in 6 weeks for reassessment   PAIN:  Are you having pain: 0/10 today  Location/description: occasional burning and popping; R shoulder, superiorly Best-worst over past week: 0-7/10  - aggravating factors: sleeping and mornings,  - Easing factors: sling, medication    PRECAUTIONS: s/p R RCR 11/23/22; using Dr. Genelle rotator cuff repair protocol per op note   WEIGHT BEARING RESTRICTIONS: Yes NWB surgical limb  FALLS:  Has patient  fallen in last 6 months? Yes. 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 Lifts and carries up to ~50#  PLOF: Independent  PATIENT GOALS: get back to playing golf, shooting pool  NEXT MD VISIT: Nov 18th , Feb 12   OBJECTIVE:  Note: Objective measures were completed at Evaluation unless otherwise noted.  DIAGNOSTIC FINDINGS:  S/p shoulder surgery 11/23/22 Panel 1: Right RIGHT SHOULDER ARTHROSCOPY WITH SUPERIOR CAPSULAR RECONSTRUCTION / EXTENSIVE DEBRIDEMENT with Genelle Standing, MD  Panel 1: Right RIGHT SHOULDER ACROMIOPLASTY with Genelle Standing, MD   PATIENT SURVEYS:  FOTO 29>64 predicted Visit 8 , 12/23/22 : 56%  Visit 16: 01/27/23: NA Visit 23: 02/22/23: 58%:    COGNITION: Overall cognitive status: Within functional limits for tasks assessed     SENSATION/OBSERVATION: Surgical site obscured by bandaging Pt denies any sensory complaints since nerve block wore off  POSTURE: Sling RUE, increased R UT elevation  UPPER EXTREMITY ROM:  ROM Right Eval NT Right 12/01/22 RT 01/10/23 RT 01/25/23 RT  02/01/23 RT 02/17/23 Rt. 02/22/23  Shoulder flexion  115  90 deg standing  90 with compensations 155 with compensation  153  Shoulder abduction  80    105 with compensation 100   Shoulder internal rotation       Thumb at T12 hand to lumbar   Shoulder external rotation  18 FR to right top of head     Fingers to T1  Elbow flexion         Elbow extension         Wrist flexion         Wrist extension          (Blank rows = not tested) (Key: WFL = within functional limits not formally assessed, * = concordant pain, s = stiffness/stretching sensation, NT =  not tested)  Comments: deferred given acuity of surgery, phase 1 on  eval, elbow flex/ext AAROM grossly WNL  UPPER EXTREMITY MMT:  MMT Right eval R 12/27/22  Rt. 01/27/23 Rt. 02/22/23  Shoulder flexion   3-/5 3/5  Shoulder extension      Shoulder abduction   3-/5 3/5 in available range  Shoulder extension      Shoulder internal rotation  5 5/5 5/5  Shoulder external rotation  4- 4/5 4/5  Elbow flexion      Elbow extension      Grip strength      (Blank rows = not tested)  (Key: WFL = within functional limits not formally assessed, * = concordant pain, s = stiffness/stretching sensation, NT = not tested)  Comments: deferred given acuity of surgery  SHOULDER SPECIAL TESTS: Deferred given acuity of surgery  JOINT MOBILITY TESTING:  Deferred given acuity of surgery    TREATMENT:           [facility] Adult PT Treatment:                                                DATE: 02/24/23 Manual Therapy: Sidelying scapular mobilization Rhythmic stabilization scapular depression and elevation  Ischemic compression to Rt med scapular border, subscapularis, post cuff  Therapeutic Activity: Pulleys flexion, scaption, horizontal abduction and IR Body blade ER/IR, flexion and abduction below 45 deg  for coordination 2 rounds x 30 sec each  Supine red band flexion  x 15  Supine small circles 2 lbs  x 10 x  2  Sidelying flexion, scaption x 15 red band, cues for scapula   Sidelying chest press 2 lbs x 10  Reverse fly 2 lbs x 10  Nustep UEs only L7, 30 sec on 30 sec off intervals for 5 min  Open book Rt UE x 10, 10 sec    [facility] Adult PT Treatment:                                                DATE: 02/22/23 Manual Therapy: PROM all planes, distraction /oscillation Therapeutic Activity: UBE 5 min L3  Pulleys overhead and scaption  Measure AROM and MMT  AROM and strengthening to minimize compensation and increase function Red looped band for serratus activation Wall slides x 10 flexion with lift  off  Foam roller longitudinal scapular retract Flexion 2 lbs 2 x 10 on wall/roller  Scaption 2 lbs 2 x 10  Black band row x 15 Shoulder flexion < 20 deg incline 3 lbs chest press x 20 Shoulder flexion to failure 2 lbs x 1 min   [facility] Adult PT Treatment:                                                DATE: 02/17/23 Therapeutic Exercise: UBE L3 Supine long lever flexion 3# 10 x 3 Supine rhythmic stab at 90 flex  Supine GTB horiz abdct 2 x 15  Shoulder ER bilat supine 2 x 15  S/L shoulder abduction 3# 2 x 10 Sl/L ER 3# 10 x 3 Incline 40 deg shoulder long lever flexion 2# x 8, 2# x 10 Incline  60 AROM x 10 long lever flexion  x 8  Standing R IR GTB x 20 Prone 2# ext x10, 3# x 10 Prone horiz abdct 2# 10 x 2  Standing Row Black band x 20       PATIENT EDUCATION: Education details: Pt education on PT impairments, prognosis, and POC. Informed consent. Rationale for interventions, safe/appropriate HEP performance Person educated: Patient Education method: Explanation, Demonstration, Tactile cues, Verbal cues, and Handouts Education comprehension: verbalized understanding, returned demonstration, verbal cues required, tactile cues required, and needs further education    HOME EXERCISE PROGRAM:  Access Code: [email or web address] URL: [email or web address] Date: 01/14/2023 Prepared by: Delon Norma  Program Notes - with seated elbow flexion, work on gently straightening and bending the elbow with the support of the other arm  Exercises - Seated Elbow Extension with Self-Anchored Resistance  - 1 x daily - 7 x weekly - 2 sets - 10 reps - 5 hold - Standing Elbow Flexion with Resistance  - 1 x daily - 7 x weekly - 2 sets - 10 reps - 5 hold - Standing Shoulder External Rotation Stretch in Doorway  - 1 x daily - 7 x weekly - 2 sets - 10 reps - 10-20 hold - Standing Shoulder Abduction AAROM with Dowel  - 1 x daily - 7 x weekly - 2 sets - 10 reps - 10 hold - Standing Shoulder Extension  with Dowel  - 1 x daily - 7 x weekly - 2 sets - 10 reps - 10 hold - Standing Shoulder Flexion AAROM with Dowel  - 1 x daily - 7 x weekly - 2 sets - 10 reps - 10 hold - Standing Shoulder Internal Rotation AAROM with Dowel  - 1 x daily - 7 x weekly - 2 sets - 10 reps - 10 hold - Seated Scapular Retraction  - 1 x daily - 7 x weekly - 2 sets - 10 reps - 10 hold - Corner Pec Major Stretch  - 1-2 x daily - 7 x weekly - 1 sets - 5 reps - 30 hold Added 4 way isometrics   ASSESSMENT:  CLINICAL IMPRESSION:   Patient continues to improve strength in the right upper extremity but with moderate compensatory strategies, shoulder height especially in abduction.  Worked on maintaining scapular position and side-lying, using tactile cues to activate lats and lower trap.  No increased pain with functional reaching and strengthening, mobility exercises just fatigued in general. Continue POC.    OBJECTIVE IMPAIRMENTS: decreased activity tolerance, decreased endurance, decreased mobility, decreased ROM, decreased strength, impaired perceived functional ability, impaired flexibility, impaired UE functional use, postural dysfunction, and pain.   ACTIVITY LIMITATIONS: carrying, lifting, bending, sleeping, transfers, bathing, toileting, dressing, self feeding, reach over head, and hygiene/grooming  PARTICIPATION LIMITATIONS: meal prep, cleaning, laundry, driving, shopping, community activity, occupation, and yard work  PERSONAL FACTORS: Time since onset of injury/illness/exacerbation and 3+ comorbidities: anxiety, DM, HTN  are also affecting patient's functional outcome.   REHAB POTENTIAL: Good  CLINICAL DECISION MAKING: Stable/uncomplicated  EVALUATION COMPLEXITY: Low   GOALS: Goals reviewed with patient? No  SHORT TERM GOALS: Target date: 01/06/2023 Pt will demonstrate appropriate understanding and performance of initially prescribed HEP in order to facilitate improved independence with management of  symptoms.  Baseline: HEP provided on eval Goal status:MET   2. Pt will score greater than or equal to 45 on FOTO in order to demonstrate improved perception of function due to symptoms.  Baseline: 29  Goal status: MET  LONG TERM GOALS: Target date: 02/17/2023   Pt will score 64 on FOTO in order to demonstrate improved perception of function due to symptoms. Baseline: 29, 56%  Goal status: ongoing   2.  Pt will demonstrate at least 150 degrees of active shoulder elevation in order to demonstrate improved tolerance to functional movement patterns such as overhead reaching. Baseline: deferred on eval given acuity of surgery Goal status: MET   3.  Pt will demonstrate at least 4+/5 shoulder flex/abduction MMT for improved symmetry of UE strength and improved tolerance to functional movements.  Baseline: deferred on eval given acuity of surgery See above  Goal status: ongoing   4. Pt will report/demonstrate ability to perform upper body dressing with less than 2 point increase in pain on NPS in order to indicate improved tolerance/independence to ADLs.  Baseline: unable to perform, requiring family assist  Goal status:MET   5. Pt will be able to lift at least 40# from floor to waist with less than 2 pt increase in pain in order to facilitate improved tolerance to work tasks.  Baseline: unable  Goal status: ongoing   6. Pt will be able to return to work without limitation of pain or functional (driving, pushing and pulling materials 40-50 lbs)   Baseline: may return to work in 1-2 weeks   Goal status: NEW   7. Pt will be able to use his Rt UE while driving, reaching with ease   Baseline: difficulty reaching back and to the radio  Goal status : NEW   PLAN:  PT FREQUENCY: 2x/week  PT DURATION: 8 weeks  PLANNED INTERVENTIONS: 97164- PT Re-evaluation, 97110-Therapeutic exercises, 97530- Therapeutic activity, 97112- Neuromuscular re-education, 97535- Self Care, 02859- Manual therapy,  97014- Electrical stimulation (unattended), 97016- Vasopneumatic device, Patient/Family education, Balance training, Stair training, Taping, Dry Needling, Joint mobilization, Spinal mobilization, Scar mobilization, Cryotherapy, and Moist heat  PLAN FOR NEXT SESSION:  Progress strength and minimize compensations.  Manual therapy, scapular retraction, posture : STRENGTHEN IN SUPINE AND RECLINED, PROGRESSING TO UPRIGHT.   Delon Norma, PT 02/24/23 12:02 PM Phone: [phone] Fax: [phone]

## 2023-02-24 ENCOUNTER — Ambulatory Visit: Payer: 59 | Admitting: Physical Therapy

## 2023-02-24 ENCOUNTER — Encounter: Payer: Self-pay | Admitting: Physical Therapy

## 2023-02-24 DIAGNOSIS — M6281 Muscle weakness (generalized): Secondary | ICD-10-CM

## 2023-02-24 DIAGNOSIS — M25511 Pain in right shoulder: Secondary | ICD-10-CM

## 2023-02-24 DIAGNOSIS — M25611 Stiffness of right shoulder, not elsewhere classified: Secondary | ICD-10-CM

## 2023-03-01 ENCOUNTER — Ambulatory Visit: Payer: 59 | Admitting: Physical Therapy

## 2023-03-01 ENCOUNTER — Encounter: Payer: Self-pay | Admitting: Physical Therapy

## 2023-03-01 DIAGNOSIS — M25511 Pain in right shoulder: Secondary | ICD-10-CM | POA: Diagnosis not present

## 2023-03-01 DIAGNOSIS — M25611 Stiffness of right shoulder, not elsewhere classified: Secondary | ICD-10-CM

## 2023-03-01 DIAGNOSIS — M6281 Muscle weakness (generalized): Secondary | ICD-10-CM

## 2023-03-01 NOTE — Therapy (Signed)
OUTPATIENT PHYSICAL THERAPY SHOULDER TREATMENT  Patient Name: [name] [name] MRN: [id number] DOB:Jun 04, 1975, 48 y.o., male Today's Date: 03/01/2023  END OF SESSION:  PT End of Session - 03/01/23 0759     Visit Number 25    Number of Visits 39    Date for PT Re-Evaluation 04/19/23    Authorization Type [employer]    Authorization Time Period 60VL no auth required    Progress Note Due on Visit 20    PT Start Time 0800    PT Stop Time 0842    PT Time Calculation (min) 42 min               Past Medical History:  Diagnosis Date   Anxiety    Diabetes mellitus without complication (HCC)    GERD (gastroesophageal reflux disease)    Hypertension    Obesity (BMI 30-39.9)    Plantar fasciitis of right foot    Skin lesion    Tendonitis    Traumatic tear of left rotator cuff 05/20/2016   Past Surgical History:  Procedure Laterality Date   SHOULDER ACROMIOPLASTY Right 11/23/2022   Procedure: RIGHT SHOULDER ACROMIOPLASTY;  Surgeon: Huel Cote, MD;  Location: [facility];  Service: Orthopedics;  Laterality: Right;   SHOULDER ARTHROSCOPY WITH ROTATOR CUFF REPAIR AND SUBACROMIAL DECOMPRESSION Left 05/21/2016   Procedure: SHOULDER ARTHROSCOPY WITH ROTATOR CUFF REPAIR AND SUBACROMIAL DECOMPRESSION LABRAL DEBRIDEMENT;  Surgeon: Salvatore Marvel, MD;  Location: [facility];  Service: Orthopedics;  Laterality: Left;   VASECTOMY     Patient Active Problem List   Diagnosis Date Noted   Traumatic complete tear of right rotator cuff 11/23/2022   Lab test positive for detection of COVID-19 virus 02/23/2022   Paresthesia 10/23/2018   Traumatic tear of left rotator cuff 05/20/2016   Family history of hypertrophic cardiomyopathy 2014/07/27   Family history of sudden cardiac death in son Jul 27, 2014    PCP: Farris Has, MD  REFERRING PROVIDER: Huel Cote, MD  REFERRING DIAG: [phone] (ICD-10-CM) - Traumatic complete tear of right rotator cuff, initial  encounter  THERAPY DIAG:  Right shoulder pain, unspecified chronicity  Stiffness of right shoulder, not elsewhere classified  Muscle weakness (generalized)  Rationale for Evaluation and Treatment: Rehabilitation  ONSET DATE: 11/23/22 rotator cuff repair, superior capsular reconstruction, acromioplasty  SUBJECTIVE:                                                                                                                                                                                      SUBJECTIVE STATEMENT: Stiff and sore, no pain. I did not do my exercises this weekend. I see MD tomorrow. My FMLA  runs out soon so I hope to go back to work light duty.   EVAL: In August pt was working on a ladder, fell backwards but unsure quite how he landed. States he did have a mild concussion but that has since resolved. Previously independent and active for work. Since surgery on 11/23/22, has been receiving assistance from wife and kids for daily tasks. Sleeping in recliner with sling and pillow under arm. Icing 3-4x/day ~71min.  Hand dominance: Right  PERTINENT HISTORY: BIL traumatic RC tears, anxiety, DM, GERD, HTN H/O Lt RCR 2018  Recent note : MD  01/05/23: 6-week status post right shoulder superior capsular reconstruction overall doing very well.  At this time I discussed I would like him to continue through the active range of motion protocol and to work on overhead active range of motion.  Overall he is doing quite well for this stage.  I will plan to see him back in 6 weeks for reassessment   PAIN:  Are you having pain: 0/10 today  Location/description: occasional burning and popping; R shoulder, superiorly Best-worst over past week: 0-7/10  - aggravating factors: sleeping and mornings,  - Easing factors: sling, medication    PRECAUTIONS: s/p R RCR 11/23/22; using Dr. Steward Drone rotator cuff repair protocol per op note   WEIGHT BEARING RESTRICTIONS: Yes NWB surgical limb  FALLS:   Has patient fallen in last 6 months? Yes. 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 Lifts and carries up to ~50#  PLOF: Independent  PATIENT GOALS: get back to playing golf, shooting pool  NEXT MD VISIT: Nov 18th , Feb 12   OBJECTIVE:  Note: Objective measures were completed at Evaluation unless otherwise noted.  DIAGNOSTIC FINDINGS:  S/p shoulder surgery 11/23/22 Panel 1: Right RIGHT SHOULDER ARTHROSCOPY WITH SUPERIOR CAPSULAR RECONSTRUCTION / EXTENSIVE DEBRIDEMENT with Huel Cote, MD  Panel 1: Right RIGHT SHOULDER ACROMIOPLASTY with Huel Cote, MD   PATIENT SURVEYS:  FOTO 29>64 predicted Visit 8 , 12/23/22 : 56%  Visit 16: 01/27/23: NA Visit 23: 02/22/23: 58%:    COGNITION: Overall cognitive status: Within functional limits for tasks assessed     SENSATION/OBSERVATION: Surgical site obscured by bandaging Pt denies any sensory complaints since nerve block wore off  POSTURE: Sling RUE, increased R UT elevation  UPPER EXTREMITY ROM:  ROM Right Eval NT Right 12/01/22 RT 01/10/23 RT 01/25/23 RT  02/01/23 RT 02/17/23 Rt. 02/22/23  Shoulder flexion  115  90 deg standing  90 with compensations 155 with compensation  153  Shoulder abduction  80    105 with compensation 100   Shoulder internal rotation       Thumb at T12 hand to lumbar   Shoulder external rotation  18 FR to right top of head     Fingers to T1  Elbow flexion         Elbow extension         Wrist flexion         Wrist extension          (Blank rows = not tested) (Key: WFL = within functional limits not formally assessed, * = concordant pain, s = stiffness/stretching  sensation, NT = not tested)  Comments:  deferred given acuity of surgery, phase 1 on eval, elbow flex/ext AAROM grossly WNL  UPPER EXTREMITY MMT:  MMT Right eval R 12/27/22  Rt. 01/27/23 Rt. 02/22/23  Shoulder flexion   3-/5 3/5  Shoulder extension      Shoulder abduction   3-/5 3/5 in available range  Shoulder extension      Shoulder internal rotation  5 5/5 5/5  Shoulder external rotation  4- 4/5 4/5  Elbow flexion      Elbow extension      Grip strength      (Blank rows = not tested)  (Key: WFL = within functional limits not formally assessed, * = concordant pain, s = stiffness/stretching sensation, NT = not tested)  Comments: deferred given acuity of surgery  SHOULDER SPECIAL TESTS: Deferred given acuity of surgery  JOINT MOBILITY TESTING:  Deferred given acuity of surgery    TREATMENT:         [facility] Adult PT Treatment:                                                DATE: 03/01/23 Therapeutic Exercise: Black band row  GTB shoulder extenson Right ER 15 x 2  RTB Right IR GTB Supine 3# long lever 10 x 3  Short lever flexion 5# short lever 10 x 2  S/L abduction 10 x 3, 3#  , S/L abduction 4# x 10 S/L ER 4# 10 x 2  Therapeutic Activity: UE ranger step and reach for flexion and abduction, cues to avoid shoulder hike Supine dowel press 2# x 10 Supine dowel pullover 2# x 10 Incline 50 degrees, long lever flexion AROM x 6, x 8 Incline 50 degrees , short flexion AROM x 10   [facility] Adult PT Treatment:                                                DATE: 02/24/23 Manual Therapy: Sidelying scapular mobilization Rhythmic stabilization scapular depression and elevation  Ischemic compression to Rt med scapular border, subscapularis, post cuff  Therapeutic Activity: Pulleys flexion, scaption, horizontal abduction and IR Body blade ER/IR, flexion and abduction below 45 deg  for coordination 2 rounds x 30 sec each  Supine red band flexion  x 15  Supine small circles 2 lbs  x 10 x  2   Sidelying flexion, scaption x 15 red band, cues for scapula   Sidelying chest press 2 lbs x 10  Reverse fly 2 lbs x 10  Nustep UEs only L7, 30 sec on 30 sec off intervals for 5 min  Open book Rt UE x 10, 10 sec    [facility] Adult PT Treatment:                                                DATE: 02/22/23 Manual Therapy: PROM all planes, distraction /oscillation Therapeutic Activity: UBE 5 min L3  Pulleys overhead and scaption  Measure AROM and MMT  AROM and strengthening to minimize compensation and increase function Red looped band for serratus activation Wall slides x 10 flexion with lift  off  Foam roller longitudinal scapular retract Flexion 2 lbs 2 x 10 on wall/roller  Scaption 2 lbs 2 x 10  Black band row x 15 Shoulder flexion < 20 deg incline 3 lbs chest press x 20 Shoulder flexion to failure 2 lbs x 1 min   [facility] Adult PT Treatment:                                                DATE: 02/17/23 Therapeutic Exercise: UBE L3 Supine long lever flexion 3# 10 x 3 Supine rhythmic stab at 90 flex  Supine GTB horiz abdct 2 x 15  Shoulder ER bilat supine 2 x 15  S/L shoulder abduction 3# 2 x 10 Sl/L ER 3# 10 x 3 Incline 40 deg shoulder long lever flexion 2# x 8, 2# x 10 Incline 60 AROM x 10 long lever flexion  x 8  Standing R IR GTB x 20 Prone 2# ext x10, 3# x 10 Prone horiz abdct 2# 10 x 2  Standing Row Black band x 20       PATIENT EDUCATION: Education details: Pt education on PT impairments, prognosis, and POC. Informed consent. Rationale for interventions, safe/appropriate HEP performance Person educated: Patient Education method: Explanation, Demonstration, Tactile cues, Verbal cues, and Handouts Education comprehension: verbalized understanding, returned demonstration, verbal cues required, tactile cues required, and needs further education    HOME EXERCISE PROGRAM:  Access Code: [email or web address] URL: [email or web address] Date: 01/14/2023 Prepared by:  Karie Mainland  Program Notes - with seated elbow flexion, work on gently straightening and bending the elbow with the support of the other arm  Exercises - Seated Elbow Extension with Self-Anchored Resistance  - 1 x daily - 7 x weekly - 2 sets - 10 reps - 5 hold - Standing Elbow Flexion with Resistance  - 1 x daily - 7 x weekly - 2 sets - 10 reps - 5 hold - Standing Shoulder External Rotation Stretch in Doorway  - 1 x daily - 7 x weekly - 2 sets - 10 reps - 10-20 hold - Standing Shoulder Abduction AAROM with Dowel  - 1 x daily - 7 x weekly - 2 sets - 10 reps - 10 hold - Standing Shoulder Extension with Dowel  - 1 x daily - 7 x weekly - 2 sets - 10 reps - 10 hold - Standing Shoulder Flexion AAROM with Dowel  - 1 x daily - 7 x weekly - 2 sets - 10 reps - 10 hold - Standing Shoulder Internal Rotation AAROM with Dowel  - 1 x daily - 7 x weekly - 2 sets - 10 reps - 10 hold - Seated Scapular Retraction  - 1 x daily - 7 x weekly - 2 sets - 10 reps - 10 hold - Corner Pec Major Stretch  - 1-2 x daily - 7 x weekly - 1 sets - 5 reps - 30 hold Added 4 way isometrics   ASSESSMENT:  CLINICAL IMPRESSION:   Pt arrives with less AROM in gravity resisted position today, reports non compliance with HEP thi weekend. Continued with strengthening in reduced gravity resisted positions to derease compensations at shoulder. He did well with with UE ranger asssitance to reach [facility] without shoulder hike. Continues to have difficulty with flexion AROM in reclined positions. Pt will f/u with MD tomorrow regarding RTW.  Continue POC.  OBJECTIVE IMPAIRMENTS: decreased activity tolerance, decreased endurance, decreased mobility, decreased ROM, decreased strength, impaired perceived functional ability, impaired flexibility, impaired UE functional use, postural dysfunction, and pain.   ACTIVITY LIMITATIONS: carrying, lifting, bending, sleeping, transfers, bathing, toileting, dressing, self feeding, reach over head, and  hygiene/grooming  PARTICIPATION LIMITATIONS: meal prep, cleaning, laundry, driving, shopping, community activity, occupation, and yard work  PERSONAL FACTORS: Time since onset of injury/illness/exacerbation and 3+ comorbidities: anxiety, DM, HTN  are also affecting patient's functional outcome.   REHAB POTENTIAL: Good  CLINICAL DECISION MAKING: Stable/uncomplicated  EVALUATION COMPLEXITY: Low   GOALS: Goals reviewed with patient? No  SHORT TERM GOALS: Target date: 01/06/2023 Pt will demonstrate appropriate understanding and performance of initially prescribed HEP in order to facilitate improved independence with management of symptoms.  Baseline: HEP provided on eval Goal status:MET   2. Pt will score greater than or equal to 45 on FOTO in order to demonstrate improved perception of function due to symptoms.  Baseline: 29  Goal status: MET  LONG TERM GOALS: Target date: 02/17/2023   Pt will score 64 on FOTO in order to demonstrate improved perception of function due to symptoms. Baseline: 29, 56%  Goal status: ongoing   2.  Pt will demonstrate at least 150 degrees of active shoulder elevation in order to demonstrate improved tolerance to functional movement patterns such as overhead reaching. Baseline: deferred on eval given acuity of surgery Goal status: MET   3.  Pt will demonstrate at least 4+/5 shoulder flex/abduction MMT for improved symmetry of UE strength and improved tolerance to functional movements.  Baseline: deferred on eval given acuity of surgery See above  Goal status: ongoing   4. Pt will report/demonstrate ability to perform upper body dressing with less than 2 point increase in pain on NPS in order to indicate improved tolerance/independence to ADLs.  Baseline: unable to perform, requiring family assist  Goal status:MET   5. Pt will be able to lift at least 40# from floor to waist with less than 2 pt increase in pain in order to facilitate improved  tolerance to work tasks.  Baseline: unable  Goal status: ongoing   6. Pt will be able to return to work without limitation of pain or functional (driving, pushing and pulling materials 40-50 lbs)   Baseline: may return to work in 1-2 weeks   Goal status: NEW   7. Pt will be able to use his Rt UE while driving, reaching with ease   Baseline: difficulty reaching back and to the radio  Goal status : NEW   PLAN:  PT FREQUENCY: 2x/week  PT DURATION: 8 weeks  PLANNED INTERVENTIONS: 97164- PT Re-evaluation, 97110-Therapeutic exercises, 97530- Therapeutic activity, 97112- Neuromuscular re-education, 97535- Self Care, 09604- Manual therapy, 97014- Electrical stimulation (unattended), 97016- Vasopneumatic device, Patient/Family education, Balance training, Stair training, Taping, Dry Needling, Joint mobilization, Spinal mobilization, Scar mobilization, Cryotherapy, and Moist heat  PLAN FOR NEXT SESSION:  Progress strength and minimize compensations.  Manual therapy, scapular retraction, posture : STRENGTHEN IN SUPINE AND RECLINED, PROGRESSING TO UPRIGHT.   Jannette Spanner, PTA 03/01/23 9:52 AM Phone: [phone] Fax: [phone]

## 2023-03-02 ENCOUNTER — Ambulatory Visit (HOSPITAL_BASED_OUTPATIENT_CLINIC_OR_DEPARTMENT_OTHER): Payer: 59 | Admitting: Orthopaedic Surgery

## 2023-03-02 DIAGNOSIS — S46011A Strain of muscle(s) and tendon(s) of the rotator cuff of right shoulder, initial encounter: Secondary | ICD-10-CM

## 2023-03-02 NOTE — Progress Notes (Signed)
Post Operative Evaluation    Procedure/Date of Surgery: Right shoulder superior capsular reconstruction 11/5  Interval History:   Presents today 12 weeks status post right shoulder superior capsular reconstruction.  Overall [name] [name] is doing extremely well.  [name] [name] has been working with physical therapy.  Range of motion is improving although [name] [name] does have some weakness.  This is continuing to improve  PMH/PSH/Family History/Social History/Meds/Allergies:    Past Medical History:  Diagnosis Date   Anxiety    Diabetes mellitus without complication (HCC)    GERD (gastroesophageal reflux disease)    Hypertension    Obesity (BMI 30-39.9)    Plantar fasciitis of right foot    Skin lesion    Tendonitis    Traumatic tear of left rotator cuff 05/20/2016   Past Surgical History:  Procedure Laterality Date   SHOULDER ACROMIOPLASTY Right 11/23/2022   Procedure: RIGHT SHOULDER ACROMIOPLASTY;  Surgeon: Huel Cote, MD;  Location: [facility];  Service: Orthopedics;  Laterality: Right;   SHOULDER ARTHROSCOPY WITH ROTATOR CUFF REPAIR AND SUBACROMIAL DECOMPRESSION Left 05/21/2016   Procedure: SHOULDER ARTHROSCOPY WITH ROTATOR CUFF REPAIR AND SUBACROMIAL DECOMPRESSION LABRAL DEBRIDEMENT;  Surgeon: Salvatore Marvel, MD;  Location: [facility];  Service: Orthopedics;  Laterality: Left;   VASECTOMY     Social History   Socioeconomic History   Marital status: Married    Spouse name: Not on file   Number of children: 4   Years of education: Not on file   Highest education level: Not on file  Occupational History   Occupation: [employer]  Tobacco Use   Smoking status: Former    Types: Cigarettes   Smokeless tobacco: Former    Types: Snuff  Vaping Use   Vaping status: Never Used  Substance and Sexual Activity   Alcohol use: Yes    Alcohol/week: 0.0 standard drinks of alcohol    Comment: Occas   Drug use: No   Sexual activity:  Not on file  Other Topics Concern   Not on file  Social History Narrative   Not on file   Social Drivers of Health   Financial Resource Strain: Not on file  Food Insecurity: Not on file  Transportation Needs: Not on file  Physical Activity: Not on file  Stress: Not on file  Social Connections: Not on file   Family History  Problem Relation Age of Onset   Hypertrophic cardiomyopathy Son    Diabetes type I Son    No Known Allergies Current Outpatient Medications  Medication Sig Dispense Refill   aspirin EC 325 MG tablet Take 1 tablet (325 mg total) by mouth daily. 14 tablet 0   atorvastatin (LIPITOR) 40 MG tablet Take 40 mg by mouth daily.     JARDIANCE 25 MG TABS tablet Take 25 mg by mouth daily.     lisinopril (ZESTRIL) 10 MG tablet Take 10 mg by mouth daily.     metFORMIN (GLUCOPHAGE) 1000 MG tablet Take 1,000 mg by mouth 2 (two) times daily.     oxyCODONE (ROXICODONE) 5 MG immediate release tablet Take 1 tablet (5 mg total) by mouth every 4 (four) hours as needed for severe pain (pain score 7-10) or breakthrough pain. (Patient not taking: Reported on 01/04/2023) 15 tablet 0   sertraline (ZOLOFT) 100 MG tablet Take 200  mg by mouth daily.     Tirzepatide [facility]) Inject into the skin once a week.     No current facility-administered medications for this visit.   No results found.  Review of Systems:   A ROS was performed including pertinent positives and negatives as documented in the HPI.   Musculoskeletal Exam:     Right shoulder is well-appearing without erythema or drainage around surgery sites.  Active forward elevation is to 160 actively, external rotation at the side is to 50 degrees equal to the contralateral side.  Internal rotation is to L1.   Imaging:      I personally reviewed and interpreted the radiographs.   Assessment:   12-week status post right shoulder superior capsular reconstruction overall doing very well.  Overall [name] [name] is doing extremely  well.  [name] [name] does have some strength deficits right now which I would like him to continue to address.  I will plan to see him back in 12 weeks for reassessment.  [name] [name] may return to work light duty at this time  Plan :    -Return to clinic in 12 weeks for reassessment      I personally saw and evaluated the patient, and participated in the management and treatment plan.  Huel Cote, MD Attending Physician, Orthopedic Surgery  This document was dictated using Dragon voice recognition software. A reasonable attempt at proof reading has been made to minimize errors.

## 2023-03-03 ENCOUNTER — Encounter: Payer: Self-pay | Admitting: Physical Therapy

## 2023-03-03 ENCOUNTER — Ambulatory Visit: Payer: 59 | Admitting: Physical Therapy

## 2023-03-03 DIAGNOSIS — M25511 Pain in right shoulder: Secondary | ICD-10-CM

## 2023-03-03 DIAGNOSIS — M6281 Muscle weakness (generalized): Secondary | ICD-10-CM

## 2023-03-03 DIAGNOSIS — M25611 Stiffness of right shoulder, not elsewhere classified: Secondary | ICD-10-CM

## 2023-03-03 NOTE — Therapy (Signed)
OUTPATIENT PHYSICAL THERAPY SHOULDER TREATMENT  Patient Name: [name] [name] MRN: [id number] DOB:Dec 11, 1975, 48 y.o., male Today's Date: 03/03/2023  END OF SESSION:  PT End of Session - 03/03/23 1020     Visit Number 26    Number of Visits 39    Date for PT Re-Evaluation 04/19/23    Authorization Type [employer]    Authorization Time Period 60VL no auth required    Progress Note Due on Visit 20    PT Start Time 1018    PT Stop Time 1109    PT Time Calculation (min) 51 min    Activity Tolerance Patient tolerated treatment well    Behavior During Therapy WFL for tasks assessed/performed               Past Medical History:  Diagnosis Date   Anxiety    Diabetes mellitus without complication (HCC)    GERD (gastroesophageal reflux disease)    Hypertension    Obesity (BMI 30-39.9)    Plantar fasciitis of right foot    Skin lesion    Tendonitis    Traumatic tear of left rotator cuff 05/20/2016   Past Surgical History:  Procedure Laterality Date   SHOULDER ACROMIOPLASTY Right 11/23/2022   Procedure: RIGHT SHOULDER ACROMIOPLASTY;  Surgeon: Huel Cote, MD;  Location: [facility];  Service: Orthopedics;  Laterality: Right;   SHOULDER ARTHROSCOPY WITH ROTATOR CUFF REPAIR AND SUBACROMIAL DECOMPRESSION Left 05/21/2016   Procedure: SHOULDER ARTHROSCOPY WITH ROTATOR CUFF REPAIR AND SUBACROMIAL DECOMPRESSION LABRAL DEBRIDEMENT;  Surgeon: Salvatore Marvel, MD;  Location: [facility];  Service: Orthopedics;  Laterality: Left;   VASECTOMY     Patient Active Problem List   Diagnosis Date Noted   Traumatic complete tear of right rotator cuff 11/23/2022   Lab test positive for detection of COVID-19 virus 02/23/2022   Paresthesia 10/23/2018   Traumatic tear of left rotator cuff 05/20/2016   Family history of hypertrophic cardiomyopathy 11-Aug-2014   Family history of sudden cardiac death in son 11-Aug-2014    PCP: Farris Has, MD  REFERRING PROVIDER:  Huel Cote, MD  REFERRING DIAG: [id number] (ICD-10-CM) - Traumatic complete tear of right rotator cuff, initial encounter  THERAPY DIAG:  Right shoulder pain, unspecified chronicity  Stiffness of right shoulder, not elsewhere classified  Muscle weakness (generalized)  Rationale for Evaluation and Treatment: Rehabilitation  ONSET DATE: 11/23/22 rotator cuff repair, superior capsular reconstruction, acromioplasty  SUBJECTIVE:                                                                                                                                                                                      SUBJECTIVE STATEMENT:  Stiff this AM.  Saw his surgeon yesterday and he was pleased.  I got my arm up high and behind my back he was really.  I see him in 3 mos.  Goes back to work Monday, light duty no lifting > 5 lbs.    EVAL: In August pt was working on a ladder, fell backwards but unsure quite how he landed. States he did have a mild concussion but that has since resolved. Previously independent and active for work. Since surgery on 11/23/22, has been receiving assistance from wife and kids for daily tasks. Sleeping in recliner with sling and pillow under arm. Icing 3-4x/day ~60min.  Hand dominance: Right  PERTINENT HISTORY: BIL traumatic RC tears, anxiety, DM, GERD, HTN H/O Lt RCR 2018  Recent note : MD  01/05/23: 6-week status post right shoulder superior capsular reconstruction overall doing very well.  At this time I discussed I would like him to continue through the active range of motion protocol and to work on overhead active range of motion.  Overall he is doing quite well for this stage.  I will plan to see him back in 6 weeks for reassessment   PAIN:  Are you having pain: 0/10 today  Location/description: occasional burning and popping; R shoulder, superiorly Best-worst over past week: 0-7/10  - aggravating factors: sleeping and mornings,  - Easing factors: sling,  medication    PRECAUTIONS: s/p R RCR 11/23/22; using Dr. Steward Drone rotator cuff repair protocol per op note   WEIGHT BEARING RESTRICTIONS: Yes NWB surgical limb  FALLS:  Has patient fallen in last 6 months? Yes. 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 Lifts and carries up to ~50#  PLOF: Independent  PATIENT GOALS: get back to playing golf, shooting pool  NEXT MD VISIT: Nov 18th , Feb 12   OBJECTIVE:  Note: Objective measures were completed at Evaluation unless otherwise noted.  DIAGNOSTIC FINDINGS:  S/p shoulder surgery 11/23/22 Panel 1: Right RIGHT SHOULDER ARTHROSCOPY WITH SUPERIOR CAPSULAR RECONSTRUCTION / EXTENSIVE DEBRIDEMENT with Huel Cote, MD  Panel 1: Right RIGHT SHOULDER ACROMIOPLASTY with Huel Cote, MD   PATIENT SURVEYS:  FOTO 29>64 predicted Visit 8 , 12/23/22 : 56%  Visit 16: 01/27/23: NA Visit 23: 02/22/23: 58%:    COGNITION: Overall cognitive status: Within functional limits for tasks assessed     SENSATION/OBSERVATION: Surgical site obscured by bandaging Pt denies any sensory complaints since nerve block wore off  POSTURE: Sling RUE, increased R UT elevation  UPPER EXTREMITY ROM:  ROM Right Eval NT Right 12/01/22 RT 01/10/23 RT 01/25/23 RT  02/01/23 RT 02/17/23 Rt. 02/22/23  Shoulder flexion  115  90 deg standing  90 with compensations 155 with compensation  153  Shoulder abduction  80    105 with compensation 100   Shoulder internal rotation       Thumb at T12 hand to lumbar   Shoulder external rotation  18 FR to right top of head     Fingers to T1  Elbow flexion         Elbow extension         Wrist  flexion         Wrist extension          (  Blank rows = not tested) (Key: WFL = within functional limits not formally assessed, * = concordant pain, s = stiffness/stretching sensation, NT = not tested)  Comments: deferred given acuity of surgery, phase 1 on eval, elbow flex/ext AAROM grossly WNL  UPPER EXTREMITY MMT:  MMT Right eval R 12/27/22  Rt. 01/27/23 Rt. 02/22/23  Shoulder flexion   3-/5 3/5  Shoulder extension      Shoulder abduction   3-/5 3/5 in available range  Shoulder extension      Shoulder internal rotation  5 5/5 5/5  Shoulder external rotation  4- 4/5 4/5  Elbow flexion      Elbow extension      Grip strength      (Blank rows = not tested)  (Key: WFL = within functional limits not formally assessed, * = concordant pain, s = stiffness/stretching sensation, NT = not tested)  Comments: deferred given acuity of surgery  SHOULDER SPECIAL TESTS: Deferred given acuity of surgery  JOINT MOBILITY TESTING:  Deferred given acuity of surgery    TREATMENT:          [facility] Adult PT Treatment:                                                DATE: 03/03/23 Therapeutic Activity: UBE 6 min L3 in reverse  ER x 15 small ROM GTB  Push with decr hike in shoulder green 2 x 12  Lat pull down 3 plates 5 sec hold bar at [employer]  Shoulder extension with bar, mod cues for spine and scap position Sidefacing Scap row horizontal pull small ROM 2 plates  x 10  Semi reclined AAROM flexion x 10  Short lever 4 lbs x 15 x 3  Long lever 2-4 bs x 10 x 3 supine and reclined S/L abduction 10 x 3, 3#  , S/L abduction 4# x 10 Prone scap retraction x 10 , limited  Extension 3 lbs  x 10 and triceps x 10 [place]vere fly no wgt. X 10  Row 4 lbs x 15   [facility] Adult PT Treatment:                                                DATE: 03/01/23 Therapeutic Exercise: Black band row  GTB shoulder extenson Right ER 15 x 2  RTB Right IR GTB Supine 3# long lever 10 x 3  Short lever flexion 5# short lever 10 x 2   S/L abduction 10 x 3, 3#  , S/L abduction 4# x 10 S/L ER 4# 10 x 2  Therapeutic Activity: UE ranger step and reach for flexion and abduction, cues to avoid shoulder hike Supine dowel press 2# x 10 Supine dowel pullover 2# x 10 Incline 50 degrees, long lever flexion AROM x 6, x 8 Incline 50 degrees , short flexion AROM x 10   [facility] Adult PT Treatment:                                                DATE: 02/24/23 Manual Therapy: Sidelying scapular mobilization Rhythmic  stabilization scapular depression and elevation  Ischemic compression to Rt med scapular border, subscapularis, post cuff  Therapeutic Activity: Pulleys flexion, scaption, horizontal abduction and IR Body blade ER/IR, flexion and abduction below 45 deg  for coordination 2 rounds x 30 sec each  Supine red band flexion  x 15  Supine small circles 2 lbs  x 10 x  2  Sidelying flexion, scaption x 15 red band, cues for scapula   Sidelying chest press 2 lbs x 10  Reverse fly 2 lbs x 10  Nustep UEs only L7, 30 sec on 30 sec off intervals for 5 min  Open book Rt UE x 10, 10 sec    [facility] Adult PT Treatment:                                                DATE: 02/22/23 Manual Therapy: PROM all planes, distraction /oscillation Therapeutic Activity: UBE 5 min L3  Pulleys overhead and scaption  Measure AROM and MMT  AROM and strengthening to minimize compensation and increase function Red looped band for serratus activation Wall slides x 10 flexion with lift off  Foam roller longitudinal scapular retract Flexion 2 lbs 2 x 10 on wall/roller  Scaption 2 lbs 2 x 10  Black band row x 15 Shoulder flexion < 20 deg incline 3 lbs chest press x 20 Shoulder flexion to failure 2 lbs x 1 min   [facility] Adult PT Treatment:                                                DATE: 02/17/23 Therapeutic Exercise: UBE L3 Supine long lever flexion 3# 10 x 3 Supine rhythmic stab at 90 flex  Supine GTB horiz abdct 2 x 15  Shoulder ER bilat supine 2 x  15  S/L shoulder abduction 3# 2 x 10 Sl/L ER 3# 10 x 3 Incline 40 deg shoulder long lever flexion 2# x 8, 2# x 10 Incline 60 AROM x 10 long lever flexion  x 8  Standing R IR GTB x 20 Prone 2# ext x10, 3# x 10 Prone horiz abdct 2# 10 x 2  Standing Row Black band x 20       PATIENT EDUCATION: Education details: Pt education on PT impairments, prognosis, and POC. Informed consent. Rationale for interventions, safe/appropriate HEP performance Person educated: Patient Education method: Explanation, Demonstration, Tactile cues, Verbal cues, and Handouts Education comprehension: verbalized understanding, returned demonstration, verbal cues required, tactile cues required, and needs further education    HOME EXERCISE PROGRAM:  Access Code: [email or web address] URL: [email or web address] Date: 01/14/2023 Prepared by: Karie Mainland  Program Notes - with seated elbow flexion, work on gently straightening and bending the elbow with the support of the other arm  Exercises - Seated Elbow Extension with Self-Anchored Resistance  - 1 x daily - 7 x weekly - 2 sets - 10 reps - 5 hold - Standing Elbow Flexion with Resistance  - 1 x daily - 7 x weekly - 2 sets - 10 reps - 5 hold - Standing Shoulder External Rotation Stretch in Doorway  - 1 x daily - 7 x weekly - 2 sets - 10 reps - 10-20 hold - Standing Shoulder Abduction AAROM  with Dowel  - 1 x daily - 7 x weekly - 2 sets - 10 reps - 10 hold - Standing Shoulder Extension with Dowel  - 1 x daily - 7 x weekly - 2 sets - 10 reps - 10 hold - Standing Shoulder Flexion AAROM with Dowel  - 1 x daily - 7 x weekly - 2 sets - 10 reps - 10 hold - Standing Shoulder Internal Rotation AAROM with Dowel  - 1 x daily - 7 x weekly - 2 sets - 10 reps - 10 hold - Seated Scapular Retraction  - 1 x daily - 7 x weekly - 2 sets - 10 reps - 10 hold - Corner Pec Major Stretch  - 1-2 x daily - 7 x weekly - 1 sets - 5 reps - 30 hold Added 4 way isometrics    ASSESSMENT:  CLINICAL IMPRESSION:   Pt arrives without pain but with stiffness he worked in multiple positions for [employer]. Pt is pleased to go back to work next week on light duty.  He needed cues in standing for posture and was unable to demo an effective scapular retraction in prone, even on the L UE.  Pt continues to benefit from skilled PT in order to perform work and home duties.    OBJECTIVE IMPAIRMENTS: decreased activity tolerance, decreased endurance, decreased mobility, decreased ROM, decreased strength, impaired perceived functional ability, impaired flexibility, impaired UE functional use, postural dysfunction, and pain.   ACTIVITY LIMITATIONS: carrying, lifting, bending, sleeping, transfers, bathing, toileting, dressing, self feeding, reach over head, and hygiene/grooming  PARTICIPATION LIMITATIONS: meal prep, cleaning, laundry, driving, shopping, community activity, occupation, and yard work  PERSONAL FACTORS: Time since onset of injury/illness/exacerbation and 3+ comorbidities: anxiety, DM, HTN  are also affecting patient's functional outcome.   REHAB POTENTIAL: Good  CLINICAL DECISION MAKING: Stable/uncomplicated  EVALUATION COMPLEXITY: Low   GOALS: Goals reviewed with patient? No  SHORT TERM GOALS: Target date: 01/06/2023 Pt will demonstrate appropriate understanding and performance of initially prescribed HEP in order to facilitate improved independence with management of symptoms.  Baseline: HEP provided on eval Goal status:MET   2. Pt will score greater than or equal to 45 on FOTO in order to demonstrate improved perception of function due to symptoms.  Baseline: 29  Goal status: MET  LONG TERM GOALS: Target date: 02/17/2023   Pt will score 64 on FOTO in order to demonstrate improved perception of function due to symptoms. Baseline: 29, 56%  Goal status: ongoing   2.  Pt will demonstrate at least 150 degrees of active shoulder  elevation in order to demonstrate improved tolerance to functional movement patterns such as overhead reaching. Baseline: deferred on eval given acuity of surgery Goal status: MET   3.  Pt will demonstrate at least 4+/5 shoulder flex/abduction MMT for improved symmetry of UE strength and improved tolerance to functional movements.  Baseline: deferred on eval given acuity of surgery See above  Goal status: ongoing   4. Pt will report/demonstrate ability to perform upper body dressing with less than 2 point increase in pain on NPS in order to indicate improved tolerance/independence to ADLs.  Baseline: unable to perform, requiring family assist  Goal status:MET   5. Pt will be able to lift at least 40# from floor to waist with less than 2 pt increase in pain in order to facilitate improved tolerance to work tasks.  Baseline: unable  Goal status: ongoing   6. Pt will be able to return  to work without limitation of pain or functional (driving, pushing and pulling materials 40-50 lbs)   Baseline: may return to work in 1-2 weeks   Goal status: NEW   7. Pt will be able to use his Rt UE while driving, reaching with ease   Baseline: difficulty reaching back and to the radio  Goal status : NEW   PLAN:  PT FREQUENCY: 2x/week  PT DURATION: 8 weeks  PLANNED INTERVENTIONS: 97164- PT Re-evaluation, 97110-Therapeutic exercises, 97530- Therapeutic activity, 97112- Neuromuscular re-education, 97535- Self Care, 72536- Manual therapy, 97014- Electrical stimulation (unattended), 97016- Vasopneumatic device, Patient/Family education, Balance training, Stair training, Taping, Dry Needling, Joint mobilization, Spinal mobilization, Scar mobilization, Cryotherapy, and Moist heat  PLAN FOR NEXT SESSION:  Upgrade HEP- scapular (try quadruped vs prone)  Progress strength and minimize compensations.  Manual therapy, scapular retraction, posture : STRENGTHEN IN SUPINE AND RECLINED, PROGRESSING TO UPRIGHT.    Karie Mainland, PT 03/03/23 11:05 AM Phone: [phone] Fax: [phone]

## 2023-03-08 ENCOUNTER — Encounter: Payer: Self-pay | Admitting: Physical Therapy

## 2023-03-08 ENCOUNTER — Ambulatory Visit: Payer: 59 | Admitting: Physical Therapy

## 2023-03-08 DIAGNOSIS — M25611 Stiffness of right shoulder, not elsewhere classified: Secondary | ICD-10-CM

## 2023-03-08 DIAGNOSIS — M6281 Muscle weakness (generalized): Secondary | ICD-10-CM

## 2023-03-08 DIAGNOSIS — M25511 Pain in right shoulder: Secondary | ICD-10-CM

## 2023-03-08 NOTE — Therapy (Signed)
 OUTPATIENT PHYSICAL THERAPY SHOULDER TREATMENT  Patient Name: [name] [name] MRN: [id number] DOB:09-Jun-1975, 48 y.o., male Today's Date: 03/08/2023  END OF SESSION:  PT End of Session - 03/08/23 0850     Visit Number 27    Number of Visits 39    Date for PT Re-Evaluation 04/19/23    Authorization Type [employer]    Authorization Time Period 60VL no auth required    Progress Note Due on Visit 20    PT Start Time [phone]    PT Stop Time 0930    PT Time Calculation (min) 43 min    Activity Tolerance Patient tolerated treatment well    Behavior During Therapy WFL for tasks assessed/performed               Past Medical History:  Diagnosis Date   Anxiety    Diabetes mellitus without complication (HCC)    GERD (gastroesophageal reflux disease)    Hypertension    Obesity (BMI 30-39.9)    Plantar fasciitis of right foot    Skin lesion    Tendonitis    Traumatic tear of left rotator cuff 05/20/2016   Past Surgical History:  Procedure Laterality Date   SHOULDER ACROMIOPLASTY Right 11/23/2022   Procedure: RIGHT SHOULDER ACROMIOPLASTY;  Surgeon: Huel Cote, MD;  Location: [facility];  Service: Orthopedics;  Laterality: Right;   SHOULDER ARTHROSCOPY WITH ROTATOR CUFF REPAIR AND SUBACROMIAL DECOMPRESSION Left 05/21/2016   Procedure: SHOULDER ARTHROSCOPY WITH ROTATOR CUFF REPAIR AND SUBACROMIAL DECOMPRESSION LABRAL DEBRIDEMENT;  Surgeon: Salvatore Marvel, MD;  Location: [facility];  Service: Orthopedics;  Laterality: Left;   VASECTOMY     Patient Active Problem List   Diagnosis Date Noted   Traumatic complete tear of right rotator cuff 11/23/2022   Lab test positive for detection of COVID-19 virus 02/23/2022   Paresthesia 10/23/2018   Traumatic tear of left rotator cuff 05/20/2016   Family history of hypertrophic cardiomyopathy 24-Aug-2014   Family history of sudden cardiac death in son Aug 24, 2014    PCP: Farris Has, MD  REFERRING PROVIDER:  Huel Cote, MD  REFERRING DIAG: [phone] (ICD-10-CM) - Traumatic complete tear of right rotator cuff, initial encounter  THERAPY DIAG:  Right shoulder pain, unspecified chronicity  Stiffness of right shoulder, not elsewhere classified  Muscle weakness (generalized)  Rationale for Evaluation and Treatment: Rehabilitation  ONSET DATE: 11/23/22 rotator cuff repair, superior capsular reconstruction, acromioplasty  SUBJECTIVE:                                                                                                                                                                                      SUBJECTIVE STATEMENT:  Back to work. No issues with this.  I am using my arm more naturally.    EVAL: In August pt was working on a ladder, fell backwards but unsure quite how he landed. States he did have a mild concussion but that has since resolved. Previously independent and active for work. Since surgery on 11/23/22, has been receiving assistance from wife and kids for daily tasks. Sleeping in recliner with sling and pillow under arm. Icing 3-4x/day ~99min.  Hand dominance: Right  PERTINENT HISTORY: BIL traumatic RC tears, anxiety, DM, GERD, HTN H/O Lt RCR 2018  Recent note : MD  01/05/23: 6-week status post right shoulder superior capsular reconstruction overall doing very well.  At this time I discussed I would like him to continue through the active range of motion protocol and to work on overhead active range of motion.  Overall he is doing quite well for this stage.  I will plan to see him back in 6 weeks for reassessment   PAIN:  Are you having pain: 0/10 today  Location/description: occasional burning and popping; R shoulder, superiorly Best-worst over past week: 0-7/10  - aggravating factors: sleeping and mornings,  - Easing factors: sling, medication    PRECAUTIONS: s/p R RCR 11/23/22; using Dr. Steward Drone rotator cuff repair protocol per op note   WEIGHT BEARING  RESTRICTIONS: Yes NWB surgical limb  FALLS:  Has patient fallen in last 6 months? Yes. 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 Lifts and carries up to ~50#  PLOF: Independent  PATIENT GOALS: get back to playing golf, shooting pool  NEXT MD VISIT: Nov 18th , Feb 12   OBJECTIVE:  Note: Objective measures were completed at Evaluation unless otherwise noted.  DIAGNOSTIC FINDINGS:  S/p shoulder surgery 11/23/22 Panel 1: Right RIGHT SHOULDER ARTHROSCOPY WITH SUPERIOR CAPSULAR RECONSTRUCTION / EXTENSIVE DEBRIDEMENT with Huel Cote, MD  Panel 1: Right RIGHT SHOULDER ACROMIOPLASTY with Huel Cote, MD   PATIENT SURVEYS:  FOTO 29>64 predicted Visit 8 , 12/23/22 : 56%  Visit 16: 01/27/23: NA Visit 23: 02/22/23: 58%:    COGNITION: Overall cognitive status: Within functional limits for tasks assessed     SENSATION/OBSERVATION: Surgical site obscured by bandaging Pt denies any sensory complaints since nerve block wore off  POSTURE: Sling RUE, increased R UT elevation  UPPER EXTREMITY ROM:  ROM Right Eval NT Right 12/01/22 RT 01/10/23 RT 01/25/23 RT  02/01/23 RT 02/17/23 Rt. 02/22/23  Shoulder flexion  115  90 deg standing  90 with compensations 155 with compensation  153  Shoulder abduction  80    105 with compensation 100   Shoulder internal rotation       Thumb at T12 hand to lumbar   Shoulder external rotation  18 FR to right top of head     Fingers to T1  Elbow flexion         Elbow extension         Wrist flexion         Wrist extension          (Blank rows = not tested) (Key: WFL = within functional limits not formally assessed, *  = concordant pain, s = stiffness/stretching sensation, NT = not  tested)  Comments: deferred given acuity of surgery, phase 1 on eval, elbow flex/ext AAROM grossly WNL  UPPER EXTREMITY MMT:  MMT Right eval R 12/27/22  Rt. 01/27/23 Rt. 02/22/23 Rt 03/08/23  Shoulder flexion   3-/5 3/5 3/5  Shoulder extension       Shoulder abduction   3-/5 3/5 in available range 4/5  Shoulder extension       Shoulder internal rotation  5 5/5 5/5   Shoulder external rotation  4- 4/5 4/5   Elbow flexion       Elbow extension       Grip strength       (Blank rows = not tested)  (Key: WFL = within functional limits not formally assessed, * = concordant pain, s = stiffness/stretching sensation, NT = not tested)  Comments: deferred given acuity of surgery  SHOULDER SPECIAL TESTS: Deferred given acuity of surgery  JOINT MOBILITY TESTING:  Deferred given acuity of surgery    TREATMENT:          [facility] Adult PT Treatment:                                                DATE: 03/08/23 Therapeutic Activity: UBE 6 min , level 3 , 3 min each direction  Double red band for the following pull x 2x 20  Deltoid row 2 x 15 Shoulder push 2 x 15  Self traction for ROM, flexibility from high bar Bilateral lat pull, scap retraction with double green band  x 15  Reclined 3 lbs slow long lever flexion 2 x 10  Quadruped ER 2 lbs x 15 (at 90 deg abd)  Quadruped scap stability added reach x 10  Sleeper Stretch x 10, gentle for post capsule  Sidelying IR 4 lbs x 20  Sidelying ER with 3 lbs x 20  Scaption 3 lbs x 15  Flexion 3 lbs x 15  Manual:    PROM all planes, MMT   [facility] Adult PT Treatment:                                                DATE: 03/03/23 Therapeutic Activity: UBE 6 min L3 in reverse  ER x 15 small ROM GTB  Push with decr hike in shoulder green 2 x 12  Lat pull down 3 plates 5 sec hold bar at Freemotion  Shoulder extension with bar, mod cues for spine and scap position Sidefacing Scap row  horizontal pull small ROM 2 plates  x 10  Semi reclined AAROM flexion x 10  Short lever 4 lbs x 15 x 3  Long lever 2-4 bs x 10 x 3 supine and reclined S/L abduction 10 x 3, 3#  , S/L abduction 4# x 10 Prone scap retraction x 10 , limited  Extension 3 lbs  x 10 and triceps x 10 Revere fly no wgt. X 10  Row 4 lbs x 15   [facility] Adult PT Treatment:  DATE: 03/01/23 Therapeutic Exercise: Black band row  GTB shoulder extenson Right ER 15 x 2  RTB Right IR GTB Supine 3# long lever 10 x 3  Short lever flexion 5# short lever 10 x 2  S/L abduction 10 x 3, 3#  , S/L abduction 4# x 10 S/L ER 4# 10 x 2  Therapeutic Activity: UE ranger step and reach for flexion and abduction, cues to avoid shoulder hike Supine dowel press 2# x 10 Supine dowel pullover 2# x 10 Incline 50 degrees, long lever flexion AROM x 6, x 8 Incline 50 degrees , short flexion AROM x 10   [facility] Adult PT Treatment:                                                DATE: 02/24/23 Manual Therapy: Sidelying scapular mobilization Rhythmic stabilization scapular depression and elevation  Ischemic compression to Rt med scapular border, subscapularis, post cuff  Therapeutic Activity: Pulleys flexion, scaption, horizontal abduction and IR Body blade ER/IR, flexion and abduction below 45 deg  for coordination 2 rounds x 30 sec each  Supine red band flexion  x 15  Supine small circles 2 lbs  x 10 x  2  Sidelying flexion, scaption x 15 red band, cues for scapula   Sidelying chest press 2 lbs x 10  Reverse fly 2 lbs x 10  Nustep UEs only L7, 30 sec on 30 sec off intervals for 5 min  Open book Rt UE x 10, 10 sec       PATIENT EDUCATION: Education details: Pt education on PT impairments, prognosis, and POC. Informed consent. Rationale for interventions, safe/appropriate HEP performance Person educated: Patient Education method: Explanation, Demonstration, Tactile cues, Verbal cues, and  Handouts Education comprehension: verbalized understanding, returned demonstration, verbal cues required, tactile cues required, and needs further education    HOME EXERCISE PROGRAM:  Access Code: [email or web address] URL: [email or web address] Date: 01/14/2023 Prepared by: Karie Mainland Access Code: [email or web address] URL: [email or web address] Date: 03/08/2023 Prepared by: Karie Mainland  Program Notes - with seated elbow flexion, work on gently straightening and bending the elbow with the support of the other arm  Exercises - Standing Elbow Flexion with Resistance  - 1 x daily - 7 x weekly - 2 sets - 10 reps - 5 hold - Standing Shoulder External Rotation Stretch in Doorway  - 1 x daily - 7 x weekly - 2 sets - 10 reps - 10-20 hold - Standing Shoulder Abduction AAROM with Dowel  - 1 x daily - 7 x weekly - 2 sets - 10 reps - 10 hold - Standing Shoulder Extension with Dowel  - 1 x daily - 7 x weekly - 2 sets - 10 reps - 10 hold - Standing Shoulder Flexion AAROM with Dowel  - 1 x daily - 7 x weekly - 2 sets - 10 reps - 10 hold - Standing Shoulder Internal Rotation AAROM with Dowel  - 1 x daily - 7 x weekly - 2 sets - 10 reps - 10 hold - Corner Pec Major Stretch  - 1-2 x daily - 7 x weekly - 1 sets - 5 reps - 30 hold - Standing Lat Pull Down with Resistance - Elbows Bent  - 1 x daily - 7 x weekly - 2 sets - 10 reps - 5 hold - Sidelying Shoulder ER with Towel  and Dumbbell  - 1 x daily - 7 x weekly - 2 sets - 10 reps - 2 hold - Sidelying Shoulder Horizontal Abduction  - 1 x daily - 7 x weekly - 2 sets - 10 reps - 2 hold - Sidelying Shoulder Scaption  - 1 x daily - 7 x weekly - 2 sets - 10 reps - 2 hold    ASSESSMENT:  CLINICAL IMPRESSION:   Pt went back to work yesterday, no pain or issues to report.  He is showing improved awareness of shoulder hike with shoulder flexion.  Improved shoulder abduction strength today. Updated HEP.  Pt continues to benefit from skilled PT in order to  perform work and home duties.    OBJECTIVE IMPAIRMENTS: decreased activity tolerance, decreased endurance, decreased mobility, decreased ROM, decreased strength, impaired perceived functional ability, impaired flexibility, impaired UE functional use, postural dysfunction, and pain.   ACTIVITY LIMITATIONS: carrying, lifting, bending, sleeping, transfers, bathing, toileting, dressing, self feeding, reach over head, and hygiene/grooming  PARTICIPATION LIMITATIONS: meal prep, cleaning, laundry, driving, shopping, community activity, occupation, and yard work  PERSONAL FACTORS: Time since onset of injury/illness/exacerbation and 3+ comorbidities: anxiety, DM, HTN  are also affecting patient's functional outcome.   REHAB POTENTIAL: Good  CLINICAL DECISION MAKING: Stable/uncomplicated  EVALUATION COMPLEXITY: Low   GOALS: Goals reviewed with patient? No  SHORT TERM GOALS: Target date: 01/06/2023 Pt will demonstrate appropriate understanding and performance of initially prescribed HEP in order to facilitate improved independence with management of symptoms.  Baseline: HEP provided on eval Goal status:MET   2. Pt will score greater than or equal to 45 on FOTO in order to demonstrate improved perception of function due to symptoms.  Baseline: 29  Goal status: MET  LONG TERM GOALS: Target date: 02/17/2023   Pt will score 64 on FOTO in order to demonstrate improved perception of function due to symptoms. Baseline: 29, 56%  Goal status: ongoing   2.  Pt will demonstrate at least 150 degrees of active shoulder elevation in order to demonstrate improved tolerance to functional movement patterns such as overhead reaching. Baseline: deferred on eval given acuity of surgery Goal status: MET   3.  Pt will demonstrate at least 4+/5 shoulder flex/abduction MMT for improved symmetry of UE strength and improved tolerance to functional movements.  Baseline: deferred on eval given acuity of  surgery See above  Goal status: ongoing   4. Pt will report/demonstrate ability to perform upper body dressing with less than 2 point increase in pain on NPS in order to indicate improved tolerance/independence to ADLs.  Baseline: unable to perform, requiring family assist  Goal status:MET   5. Pt will be able to lift at least 40# from floor to waist with less than 2 pt increase in pain in order to facilitate improved tolerance to work tasks.  Baseline: unable  Goal status: ongoing   6. Pt will be able to return to work without limitation of pain or functional (driving, pushing and pulling materials 40-50 lbs)   Baseline: may return to work in 1-2 weeks   Goal status: NEW   7. Pt will be able to use his Rt UE while driving, reaching with ease   Baseline: difficulty reaching back and to the radio  Goal status : NEW   PLAN:  PT FREQUENCY: 2x/week  PT DURATION: 8 weeks  PLANNED INTERVENTIONS: 97164- PT Re-evaluation, 97110-Therapeutic exercises, 97530- Therapeutic activity, 97112- Neuromuscular re-education, 97535- Self Care, 45409- Manual therapy, 97014- Electrical stimulation (unattended),  54098- Vasopneumatic device, Patient/Family education, Balance training, Stair training, Taping, Dry Needling, Joint mobilization, Spinal mobilization, Scar mobilization, Cryotherapy, and Moist heat  PLAN FOR NEXT SESSION:  Upgrade HEP- scapular (try quadruped vs prone)  Progress strength and minimize compensations.  Manual therapy, scapular retraction, posture : STRENGTHEN IN SUPINE AND RECLINED, PROGRESSING TO UPRIGHT.   Karie Mainland, PT 03/08/23 10:31 AM Phone: [phone] Fax: [phone]

## 2023-03-09 NOTE — Therapy (Unsigned)
 OUTPATIENT PHYSICAL THERAPY SHOULDER TREATMENT  Patient Name: [name] [name] MRN: [id number] DOB:February 25, 1975, 48 y.o., male Today's Date: 03/10/2023  END OF SESSION:  PT End of Session - 03/10/23 0935     Visit Number 28    Number of Visits 39    Date for PT Re-Evaluation 04/19/23    Authorization Type [employer]    Authorization Time Period 60VL no auth required    Progress Note Due on Visit 20    PT Start Time 0933    PT Stop Time 1030    PT Time Calculation (min) 57 min    Activity Tolerance Patient tolerated treatment well    Behavior During Therapy WFL for tasks assessed/performed                Past Medical History:  Diagnosis Date   Anxiety    Diabetes mellitus without complication (HCC)    GERD (gastroesophageal reflux disease)    Hypertension    Obesity (BMI 30-39.9)    Plantar fasciitis of right foot    Skin lesion    Tendonitis    Traumatic tear of left rotator cuff 05/20/2016   Past Surgical History:  Procedure Laterality Date   SHOULDER ACROMIOPLASTY Right 11/23/2022   Procedure: RIGHT SHOULDER ACROMIOPLASTY;  Surgeon: Huel Cote, MD;  Location: [facility];  Service: Orthopedics;  Laterality: Right;   SHOULDER ARTHROSCOPY WITH ROTATOR CUFF REPAIR AND SUBACROMIAL DECOMPRESSION Left 05/21/2016   Procedure: SHOULDER ARTHROSCOPY WITH ROTATOR CUFF REPAIR AND SUBACROMIAL DECOMPRESSION LABRAL DEBRIDEMENT;  Surgeon: Salvatore Marvel, MD;  Location: [facility];  Service: Orthopedics;  Laterality: Left;   VASECTOMY     Patient Active Problem List   Diagnosis Date Noted   Traumatic complete tear of right rotator cuff 11/23/2022   Lab test positive for detection of COVID-19 virus 02/23/2022   Paresthesia 10/23/2018   Traumatic tear of left rotator cuff 05/20/2016   Family history of hypertrophic cardiomyopathy 2014/08/05   Family history of sudden cardiac death in son 2014-08-05    PCP: Farris Has, MD  REFERRING PROVIDER:  Huel Cote, MD  REFERRING DIAG: [id number] (ICD-10-CM) - Traumatic complete tear of right rotator cuff, initial encounter  THERAPY DIAG:  Right shoulder pain, unspecified chronicity  Stiffness of right shoulder, not elsewhere classified  Muscle weakness (generalized)  Rationale for Evaluation and Treatment: Rehabilitation  ONSET DATE: 11/23/22 rotator cuff repair, superior capsular reconstruction, acromioplasty  SUBJECTIVE:                                                                                                                                                                                      SUBJECTIVE  STATEMENT:  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 The incision is feeling like a itchy, healing pain today.    EVAL: In August pt was working on a ladder, fell backwards but unsure quite how he landed. States he did have a mild concussion but that has since resolved. Previously independent and active for work. Since surgery on 11/23/22, has been receiving assistance from wife and kids for daily tasks. Sleeping in recliner with sling and pillow under arm. Icing 3-4x/day ~10min.  Hand dominance: Right  PERTINENT HISTORY: BIL traumatic RC tears, anxiety, DM, GERD, HTN H/O Lt RCR 2018  Recent note : MD  01/05/23: 6-week status post right shoulder superior capsular reconstruction overall doing very well.  At this time I discussed I would like him to continue through the active range of motion protocol and to work on overhead active range of motion.  Overall he is doing quite well for this stage.  I will plan to see him back in 6 weeks for reassessment   PAIN:  Are you having pain: 0/10 today  Location/description: occasional burning and popping; R shoulder, superiorly Best-worst over past week: 0-7/10  - aggravating factors: sleeping and mornings,  - Easing factors: sling, medication    PRECAUTIONS: s/p R RCR 11/23/22; using Dr. Steward Drone rotator cuff repair  protocol per op note   WEIGHT BEARING RESTRICTIONS: Yes NWB surgical limb  FALLS:  Has patient fallen in last 6 months? Yes. 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 Lifts and carries up to ~50#  PLOF: Independent  PATIENT GOALS: get back to playing golf, shooting pool  NEXT MD VISIT: Nov 18th , Feb 12   OBJECTIVE:  Note: Objective measures were completed at Evaluation unless otherwise noted.  DIAGNOSTIC FINDINGS:  S/p shoulder surgery 11/23/22 Panel 1: Right RIGHT SHOULDER ARTHROSCOPY WITH SUPERIOR CAPSULAR RECONSTRUCTION / EXTENSIVE DEBRIDEMENT with Huel Cote, MD  Panel 1: Right RIGHT SHOULDER ACROMIOPLASTY with Huel Cote, MD   PATIENT SURVEYS:  FOTO 29>64 predicted Visit 8 , 12/23/22 : 56%  Visit 16: 01/27/23: NA Visit 23: 02/22/23: 58%:    COGNITION: Overall cognitive status: Within functional limits for tasks assessed     SENSATION/OBSERVATION: Surgical site obscured by bandaging Pt denies any sensory complaints since nerve block wore off  POSTURE: Sling RUE, increased R UT elevation  UPPER EXTREMITY ROM:  ROM Right Eval NT Right 12/01/22 RT 01/10/23 RT 01/25/23 RT  02/01/23 RT 02/17/23 Rt. 02/22/23 Rt.  03/10/23  Shoulder flexion  115  90 deg standing  90 with compensations 155 with compensation  153 155 heavy comp  Shoulder abduction  80    105 with compensation 100    Shoulder internal rotation       Thumb at T12 hand to lumbar  At 90 deg abd supine 80  Shoulder external rotation  18 FR to right top of head     Fingers to T1 At 90 deg abd supine 65  Elbow flexion          Elbow extension          Wrist flexion           Wrist extension           (  Blank rows = not tested) (Key: WFL = within functional limits not formally assessed, * = concordant pain, s = stiffness/stretching sensation, NT = not tested)  Comments: deferred given acuity of surgery, phase 1 on eval, elbow flex/ext AAROM grossly WNL  UPPER EXTREMITY MMT:  MMT Right eval R 12/27/22  Rt. 01/27/23 Rt. 02/22/23 Rt 03/08/23  Shoulder flexion   3-/5 3/5 3/5  Shoulder extension       Shoulder abduction   3-/5 3/5 in available range 4/5  Shoulder extension       Shoulder internal rotation  5 5/5 5/5   Shoulder external rotation  4- 4/5 4/5   Elbow flexion       Elbow extension       Grip strength       (Blank rows = not tested)  (Key: WFL = within functional limits not formally assessed, * = concordant pain, s = stiffness/stretching sensation, NT = not tested)  Comments: deferred given acuity of surgery  SHOULDER SPECIAL TESTS: Deferred given acuity of surgery  JOINT MOBILITY TESTING:  Deferred given acuity of surgery    TREATMENT:          [facility] Adult PT Treatment:                                                DATE: 03/10/23 Therapeutic Activity: UBE level 3, 3 min Fw 3 min back   Wall slides flexion and out at a diagonal and across about 1 min each  Semi circle sweeps on the wall  Standing shoulder strength: flexion, scaption x 2 lb x 2 x 10  Closed chain high plank at countertop 30 sec x 3 added forearm plank for last rep  CKC high plank at high mat added shoulder tap  but too much  Bent over shoulder extension 4 lbs x 10 and then triceps x 10  GTB ER looped x 15  GTB ER looped x 15 with slight flexion  Bent knee reverse fly 3 lbs x 15  Bent over row 10 lbs x 15  Black TB wide high row and lat pull down x 10  Extension  x 10 black band  Added ER in goal post Green band  Hooklying with partial reclined (25 deg) overhead reaching  ER and IR green band at 90 deg    [facility] Adult PT Treatment:                                                 DATE: 03/08/23 Therapeutic Activity: UBE 6 min , level 3 , 3 min each direction  Double red band for the following pull x 2x 20  Deltoid row 2 x 15 Shoulder push 2 x 15  Self traction for ROM, flexibility from high bar Bilateral lat pull, scap retraction with double green band  x 15  Reclined 3 lbs slow long lever flexion 2 x 10  Quadruped ER 2 lbs x 15 (at 90 deg abd)  Quadruped scap stability added reach x 10  Sleeper Stretch x 10, gentle for post capsule  Sidelying IR 4 lbs x 20  Sidelying ER with 3 lbs x 20  Scaption 3 lbs x 15  Flexion  3 lbs x 15  Manual:    PROM all planes, MMT   [facility] Adult PT Treatment:                                                DATE: 03/03/23 Therapeutic Activity: UBE 6 min L3 in reverse  ER x 15 small ROM GTB  Push with decr hike in shoulder green 2 x 12  Lat pull down 3 plates 5 sec hold bar at Freemotion  Shoulder extension with bar, mod cues for spine and scap position Sidefacing Scap row horizontal pull small ROM 2 plates  x 10  Semi reclined AAROM flexion x 10  Short lever 4 lbs x 15 x 3  Long lever 2-4 bs x 10 x 3 supine and reclined S/L abduction 10 x 3, 3#  , S/L abduction 4# x 10 Prone scap retraction x 10 , limited  Extension 3 lbs  x 10 and triceps x 10 [place]vere fly no wgt. X 10  Row 4 lbs x 15   [facility] Adult PT Treatment:                                                DATE: 03/01/23 Therapeutic Exercise: Black band row  GTB shoulder extenson Right ER 15 x 2  RTB Right IR GTB Supine 3# long lever 10 x 3  Short lever flexion 5# short lever 10 x 2  S/L abduction 10 x 3, 3#  , S/L abduction 4# x 10 S/L ER 4# 10 x 2  Therapeutic Activity: UE ranger step and reach for flexion and abduction, cues to avoid shoulder hike Supine dowel press 2# x 10 Supine dowel pullover 2# x 10 Incline 50 degrees, long lever flexion AROM x 6, x 8 Incline 50 degrees , short flexion AROM x 10   [facility] Adult PT Treatment:                                                 DATE: 02/24/23 Manual Therapy: Sidelying scapular mobilization Rhythmic stabilization scapular depression and elevation  Ischemic compression to Rt med scapular border, subscapularis, post cuff  Therapeutic Activity: Pulleys flexion, scaption, horizontal abduction and IR Body blade ER/IR, flexion and abduction below 45 deg  for coordination 2 rounds x 30 sec each  Supine red band flexion  x 15  Supine small circles 2 lbs  x 10 x  2  Sidelying flexion, scaption x 15 red band, cues for scapula   Sidelying chest press 2 lbs x 10  Reverse fly 2 lbs x 10  Nustep UEs only L7, 30 sec on 30 sec off intervals for 5 min  Open book Rt UE x 10, 10 sec       PATIENT EDUCATION: Education details: Pt education on PT impairments, prognosis, and POC. Informed consent. Rationale for interventions, safe/appropriate HEP performance Person educated: Patient Education method: Explanation, Demonstration, Tactile cues, Verbal cues, and Handouts Education comprehension: verbalized understanding, returned demonstration, verbal cues required, tactile cues required, and needs further education    HOME EXERCISE PROGRAM:  Access Code: [email or web address] URL: [email or web address] Date: 01/14/2023 Prepared by: Karie Mainland Access Code: [email or web address] URL: [email or web address] Date: 03/08/2023 Prepared by: Karie Mainland  Program Notes - with seated elbow flexion, work on gently straightening and bending the elbow with the support of the other arm  Exercises - Standing Elbow Flexion with Resistance  - 1 x daily - 7 x weekly - 2 sets - 10 reps - 5 hold - Standing Shoulder External Rotation Stretch in Doorway  - 1 x daily - 7 x weekly - 2 sets - 10 reps - 10-20 hold - Standing Shoulder Abduction AAROM with Dowel  - 1 x daily - 7 x weekly - 2 sets - 10 reps - 10 hold - Standing Shoulder Extension with Dowel  - 1 x daily - 7 x weekly - 2 sets - 10 reps - 10 hold - Standing  Shoulder Flexion AAROM with Dowel  - 1 x daily - 7 x weekly - 2 sets - 10 reps - 10 hold - Standing Shoulder Internal Rotation AAROM with Dowel  - 1 x daily - 7 x weekly - 2 sets - 10 reps - 10 hold - Corner Pec Major Stretch  - 1-2 x daily - 7 x weekly - 1 sets - 5 reps - 30 hold - Standing Lat Pull Down with Resistance - Elbows Bent  - 1 x daily - 7 x weekly - 2 sets - 10 reps - 5 hold - Sidelying Shoulder ER with Towel and Dumbbell  - 1 x daily - 7 x weekly - 2 sets - 10 reps - 2 hold - Sidelying Shoulder Horizontal Abduction  - 1 x daily - 7 x weekly - 2 sets - 10 reps - 2 hold - Sidelying Shoulder Scaption  - 1 x daily - 7 x weekly - 2 sets - 10 reps - 2 hold    ASSESSMENT:  CLINICAL IMPRESSION:   Patient tolerates all exercises today without increased pain .  He is noticing ease with lying on Rt side at night, reaching and ADLs. Pt continues to have significant compensation with raising arm overhead but is very aware, working on it throughout the session.  Cont POC to restore full function of Rt UE.   OBJECTIVE IMPAIRMENTS: decreased activity tolerance, decreased endurance, decreased mobility, decreased ROM, decreased strength, impaired perceived functional ability, impaired flexibility, impaired UE functional use, postural dysfunction, and pain.   ACTIVITY LIMITATIONS: carrying, lifting, bending, sleeping, transfers, bathing, toileting, dressing, self feeding, reach over head, and hygiene/grooming  PARTICIPATION LIMITATIONS: meal prep, cleaning, laundry, driving, shopping, community activity, occupation, and yard work  PERSONAL FACTORS: Time since onset of injury/illness/exacerbation and 3+ comorbidities: anxiety, DM, HTN  are also affecting patient's functional outcome.   REHAB POTENTIAL: Good  CLINICAL DECISION MAKING: Stable/uncomplicated  EVALUATION COMPLEXITY: Low   GOALS: Goals reviewed with patient? No  SHORT TERM GOALS: Target date: 01/06/2023 Pt will demonstrate  appropriate understanding and performance of initially prescribed HEP in order to facilitate improved independence with management of symptoms.  Baseline: HEP provided on eval Goal status:MET   2. Pt will score greater than or equal to 45 on FOTO in order to demonstrate improved perception of function due to symptoms.  Baseline: 29  Goal status: MET  LONG TERM GOALS: Target date: 02/17/2023   Pt will score 64 on FOTO in order to demonstrate improved perception of function due to symptoms. Baseline: 29, 56%  Goal status: ongoing   2.  Pt will demonstrate at least  150 degrees of active shoulder elevation in order to demonstrate improved tolerance to functional movement patterns such as overhead reaching. Baseline: can do this with heavy compensation  Goal status: MET   3.  Pt will demonstrate at least 4+/5 shoulder flex/abduction MMT for improved symmetry of UE strength and improved tolerance to functional movements.  Baseline: deferred on eval given acuity of surgery See above  Goal status: ongoing   4. Pt will report/demonstrate ability to perform upper body dressing with less than 2 point increase in pain on NPS in order to indicate improved tolerance/independence to ADLs.  Baseline: unable to perform, requiring family assist  Goal status:MET   5. Pt will be able to lift at least 40# from floor to waist with less than 2 pt increase in pain in order to facilitate improved tolerance to work tasks.  Baseline: unable  Goal status: ongoing   6. Pt will be able to return to work without limitation of pain or functional (driving, pushing and pulling materials 40-50 lbs)   Baseline: able to drive without issues, not lifting on restriction  Goal status: ongoing   7. Pt will be able to use his Rt UE while driving, reaching with ease   Baseline: difficulty reaching back and to the radio  Goal status :ongoing   PLAN:  PT FREQUENCY: 2x/week  PT DURATION: 8 weeks  PLANNED  INTERVENTIONS: 97164- PT Re-evaluation, 97110-Therapeutic exercises, 97530- Therapeutic activity, 97112- Neuromuscular re-education, 97535- Self Care, 95284- Manual therapy, 97014- Electrical stimulation (unattended), 97016- Vasopneumatic device, Patient/Family education, Balance training, Stair training, Taping, Dry Needling, Joint mobilization, Spinal mobilization, Scar mobilization, Cryotherapy, and Moist heat  PLAN FOR NEXT SESSION:  Upgrade HEP- scapular (try quadruped vs prone)  Progress strength and minimize compensations.  Manual therapy, scapular retraction, posture : STRENGTHEN IN SUPINE AND RECLINED, PROGRESSING TO UPRIGHT.   Karie Mainland, PT 03/10/23 10:29 AM Phone: [phone] Fax: [phone]

## 2023-03-10 ENCOUNTER — Ambulatory Visit: Payer: 59 | Admitting: Physical Therapy

## 2023-03-10 ENCOUNTER — Encounter: Payer: Self-pay | Admitting: Physical Therapy

## 2023-03-10 DIAGNOSIS — M25511 Pain in right shoulder: Secondary | ICD-10-CM | POA: Diagnosis not present

## 2023-03-10 DIAGNOSIS — M25611 Stiffness of right shoulder, not elsewhere classified: Secondary | ICD-10-CM

## 2023-03-10 DIAGNOSIS — M6281 Muscle weakness (generalized): Secondary | ICD-10-CM

## 2023-03-15 ENCOUNTER — Ambulatory Visit: Payer: 59 | Admitting: Physical Therapy

## 2023-03-15 ENCOUNTER — Encounter: Payer: Self-pay | Admitting: Physical Therapy

## 2023-03-15 DIAGNOSIS — M6281 Muscle weakness (generalized): Secondary | ICD-10-CM

## 2023-03-15 DIAGNOSIS — M25511 Pain in right shoulder: Secondary | ICD-10-CM

## 2023-03-15 DIAGNOSIS — M25611 Stiffness of right shoulder, not elsewhere classified: Secondary | ICD-10-CM

## 2023-03-15 NOTE — Therapy (Signed)
 OUTPATIENT PHYSICAL THERAPY SHOULDER TREATMENT  Patient Name: [name] [name] MRN: [id number] DOB:10/21/1975, 48 y.o., male Today's Date: 03/15/2023  END OF SESSION:  PT End of Session - 03/15/23 0936     Visit Number 29    Number of Visits 39    Date for PT Re-Evaluation 04/19/23    Authorization Type [employer]    Authorization Time Period 60VL no auth required    Progress Note Due on Visit 20    PT Start Time 0933    PT Stop Time 1021    PT Time Calculation (min) 48 min                Past Medical History:  Diagnosis Date   Anxiety    Diabetes mellitus without complication (HCC)    GERD (gastroesophageal reflux disease)    Hypertension    Obesity (BMI 30-39.9)    Plantar fasciitis of right foot    Skin lesion    Tendonitis    Traumatic tear of left rotator cuff 05/20/2016   Past Surgical History:  Procedure Laterality Date   SHOULDER ACROMIOPLASTY Right 11/23/2022   Procedure: RIGHT SHOULDER ACROMIOPLASTY;  Surgeon: Huel Cote, MD;  Location: [facility];  Service: Orthopedics;  Laterality: Right;   SHOULDER ARTHROSCOPY WITH ROTATOR CUFF REPAIR AND SUBACROMIAL DECOMPRESSION Left 05/21/2016   Procedure: SHOULDER ARTHROSCOPY WITH ROTATOR CUFF REPAIR AND SUBACROMIAL DECOMPRESSION LABRAL DEBRIDEMENT;  Surgeon: Salvatore Marvel, MD;  Location: [facility];  Service: Orthopedics;  Laterality: Left;   VASECTOMY     Patient Active Problem List   Diagnosis Date Noted   Traumatic complete tear of right rotator cuff 11/23/2022   Lab test positive for detection of COVID-19 virus 02/23/2022   Paresthesia 10/23/2018   Traumatic tear of left rotator cuff 05/20/2016   Family history of hypertrophic cardiomyopathy 08/17/2014   Family history of sudden cardiac death in son 08-17-14    PCP: Farris Has, MD  REFERRING PROVIDER: Huel Cote, MD  REFERRING DIAG: [phone] (ICD-10-CM) - Traumatic complete tear of right rotator cuff, initial  encounter  THERAPY DIAG:  Right shoulder pain, unspecified chronicity  Stiffness of right shoulder, not elsewhere classified  Muscle weakness (generalized)  Rationale for Evaluation and Treatment: Rehabilitation  ONSET DATE: 11/23/22 rotator cuff repair, superior capsular reconstruction, acromioplasty  SUBJECTIVE:                                                                                                                                                                                      SUBJECTIVE STATEMENT:  Doing fine. Still doing the exercises at home    EVAL: In August pt was working  on a ladder, fell backwards but unsure quite how he landed. States he did have a mild concussion but that has since resolved. Previously independent and active for work. Since surgery on 11/23/22, has been receiving assistance from wife and kids for daily tasks. Sleeping in recliner with sling and pillow under arm. Icing 3-4x/day ~61min.  Hand dominance: Right  PERTINENT HISTORY: BIL traumatic RC tears, anxiety, DM, GERD, HTN H/O Lt RCR 2018  Recent note : MD  01/05/23: 6-week status post right shoulder superior capsular reconstruction overall doing very well.  At this time I discussed I would like him to continue through the active range of motion protocol and to work on overhead active range of motion.  Overall he is doing quite well for this stage.  I will plan to see him back in 6 weeks for reassessment   PAIN:  Are you having pain: 0/10 today  Location/description: occasional burning and popping; R shoulder, superiorly Best-worst over past week: 0-7/10  - aggravating factors: sleeping and mornings,  - Easing factors: sling, medication    PRECAUTIONS: s/p R RCR 11/23/22; using Dr. Steward Drone rotator cuff repair protocol per op note   WEIGHT BEARING RESTRICTIONS: Yes NWB surgical limb  FALLS:  Has patient fallen in last 6 months? Yes. 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 Lifts and carries up to ~50#  PLOF: Independent  PATIENT GOALS: get back to playing golf, shooting pool  NEXT MD VISIT: Nov 18th , Feb 12   OBJECTIVE:  Note: Objective measures were completed at Evaluation unless otherwise noted.  DIAGNOSTIC FINDINGS:  S/p shoulder surgery 11/23/22 Panel 1: Right RIGHT SHOULDER ARTHROSCOPY WITH SUPERIOR CAPSULAR RECONSTRUCTION / EXTENSIVE DEBRIDEMENT with Huel Cote, MD  Panel 1: Right RIGHT SHOULDER ACROMIOPLASTY with Huel Cote, MD   PATIENT SURVEYS:  FOTO 29>64 predicted Visit 8 , 12/23/22 : 56%  Visit 16: 01/27/23: NA Visit 23: 02/22/23: 58%:    COGNITION: Overall cognitive status: Within functional limits for tasks assessed     SENSATION/OBSERVATION: Surgical site obscured by bandaging Pt denies any sensory complaints since nerve block wore off  POSTURE: Sling RUE, increased R UT elevation  UPPER EXTREMITY ROM:  ROM Right Eval NT Right 12/01/22 RT 01/10/23 RT 01/25/23 RT  02/01/23 RT 02/17/23 Rt. 02/22/23 Rt.  03/10/23  Shoulder flexion  115  90 deg standing  90 with compensations 155 with compensation  153 155 heavy comp  Shoulder abduction  80    105 with compensation 100    Shoulder internal rotation       Thumb at T12 hand to lumbar  At 90 deg abd supine 80  Shoulder external rotation  18 FR to right top of head     Fingers to T1 At 90 deg abd supine 65  Elbow flexion          Elbow extension          Wrist flexion          Wrist extension           (Blank rows = not tested) (Key: WFL = within functional limits not formally assessed, * = concordant pain, s = stiffness/stretching sensation, NT = not  tested)  Comments: deferred given acuity of surgery, phase 1 on eval, elbow flex/ext AAROM grossly WNL  UPPER EXTREMITY MMT:  MMT Right eval R 12/27/22  Rt. 01/27/23 Rt. 02/22/23 Rt 03/08/23  Shoulder flexion   3-/5 3/5 3/5  Shoulder extension       Shoulder abduction   3-/5 3/5 in available range 4/5  Shoulder extension       Shoulder internal rotation  5 5/5 5/5   Shoulder external rotation  4- 4/5 4/5   Elbow flexion       Elbow extension       Grip strength       (Blank rows = not tested)  (Key: WFL = within functional limits not formally assessed, * = concordant pain, s = stiffness/stretching sensation, NT = not tested)  Comments: deferred given acuity of surgery  SHOULDER SPECIAL TESTS: Deferred given acuity of surgery  JOINT MOBILITY TESTING:  Deferred given acuity of surgery    TREATMENT:         [facility] Adult PT Treatment:                                                DATE: 03/15/23 Therapeutic Exercise: UBE Reclined shoulder flexion AROM 10 x 3  Supine shoulder flexion  strengthening 4# 3 x 10  Supine 5# protraction 2 x 15 S/L 4# 10 x 3  S/L ER 4# 10 x 3  Cable Row single arm 10# x 15, 13# x 15  Cable shoulder extension 13# 3 x 10- thumb in/ thumb up   Therapeutic Activity: CKC high plank at counter  added weight shifting  Modalities: Ice pack    [facility] Adult PT Treatment:                                                DATE: 03/10/23 Therapeutic Activity: UBE level 3, 3 min Fw 3 min back   Wall slides flexion and out at a diagonal and across about 1 min each  Semi circle sweeps on the wall  Standing shoulder strength: flexion, scaption x 2 lb x 2 x 10  Closed chain high plank at countertop 30 sec x 3 added forearm plank for last rep  CKC high plank at high mat added shoulder tap  but too much  Bent over shoulder extension 4 lbs x 10 and then triceps x 10  GTB ER looped x 15  GTB ER looped x 15 with slight flexion  Bent knee reverse fly 3 lbs x 15  Bent  over row 10 lbs x 15  Black TB wide high row and lat pull down x 10  Extension  x 10 black band  Added ER in goal post Green band  Hooklying with partial reclined (25 deg) overhead reaching  ER and IR green band at 90 deg    [facility] Adult PT Treatment:                                                DATE: 03/08/23 Therapeutic Activity: UBE 6 min , level 3 , 3 min  each direction  Double red band for the following pull x 2x 20  Deltoid row 2 x 15 Shoulder push 2 x 15  Self traction for ROM, flexibility from high bar Bilateral lat pull, scap retraction with double green band  x 15  Reclined 3 lbs slow long lever flexion 2 x 10  Quadruped ER 2 lbs x 15 (at 90 deg abd)  Quadruped scap stability added reach x 10  Sleeper Stretch x 10, gentle for post capsule  Sidelying IR 4 lbs x 20  Sidelying ER with 3 lbs x 20  Scaption 3 lbs x 15  Flexion 3 lbs x 15  Manual:    PROM all planes, MMT   [facility] Adult PT Treatment:                                                DATE: 03/03/23 Therapeutic Activity: UBE 6 min L3 in reverse  ER x 15 small ROM GTB  Push with decr hike in shoulder green 2 x 12  Lat pull down 3 plates 5 sec hold bar at Freemotion  Shoulder extension with bar, mod cues for spine and scap position Sidefacing Scap row horizontal pull small ROM 2 plates  x 10  Semi reclined AAROM flexion x 10  Short lever 4 lbs x 15 x 3  Long lever 2-4 bs x 10 x 3 supine and reclined S/L abduction 10 x 3, 3#  , S/L abduction 4# x 10 Prone scap retraction x 10 , limited  Extension 3 lbs  x 10 and triceps x 10 [place]vere fly no wgt. X 10  Row 4 lbs x 15   [facility] Adult PT Treatment:                                                DATE: 03/01/23 Therapeutic Exercise: Black band row  GTB shoulder extenson Right ER 15 x 2  RTB Right IR GTB Supine 3# long lever 10 x 3  Short lever flexion 5# short lever 10 x 2  S/L abduction 10 x 3, 3#  , S/L abduction 4# x 10 S/L ER 4# 10 x 2  Therapeutic Activity: UE  ranger step and reach for flexion and abduction, cues to avoid shoulder hike Supine dowel press 2# x 10 Supine dowel pullover 2# x 10 Incline 50 degrees, long lever flexion AROM x 6, x 8 Incline 50 degrees , short flexion AROM x 10   [facility] Adult PT Treatment:                                                DATE: 02/24/23 Manual Therapy: Sidelying scapular mobilization Rhythmic stabilization scapular depression and elevation  Ischemic compression to Rt med scapular border, subscapularis, post cuff  Therapeutic Activity: Pulleys flexion, scaption, horizontal abduction and IR Body blade ER/IR, flexion and abduction below 45 deg  for coordination 2 rounds x 30 sec each  Supine red band flexion  x 15  Supine small circles 2 lbs  x 10 x  2  Sidelying flexion, scaption x 15  red band, cues for scapula   Sidelying chest press 2 lbs x 10  Reverse fly 2 lbs x 10  Nustep UEs only L7, 30 sec on 30 sec off intervals for 5 min  Open book Rt UE x 10, 10 sec       PATIENT EDUCATION: Education details: Pt education on PT impairments, prognosis, and POC. Informed consent. Rationale for interventions, safe/appropriate HEP performance Person educated: Patient Education method: Explanation, Demonstration, Tactile cues, Verbal cues, and Handouts Education comprehension: verbalized understanding, returned demonstration, verbal cues required, tactile cues required, and needs further education    HOME EXERCISE PROGRAM:  Access Code: [email or web address] URL: [email or web address] Date: 01/14/2023 Prepared by: Karie Mainland Access Code: [email or web address] URL: [email or web address] Date: 03/08/2023 Prepared by: Karie Mainland  Program Notes - with seated elbow flexion, work on gently straightening and bending the elbow with the support of the other arm  Exercises - Standing Elbow Flexion with Resistance  - 1 x daily - 7 x weekly - 2 sets - 10 reps - 5 hold - Standing Shoulder External Rotation  Stretch in Doorway  - 1 x daily - 7 x weekly - 2 sets - 10 reps - 10-20 hold - Standing Shoulder Abduction AAROM with Dowel  - 1 x daily - 7 x weekly - 2 sets - 10 reps - 10 hold - Standing Shoulder Extension with Dowel  - 1 x daily - 7 x weekly - 2 sets - 10 reps - 10 hold - Standing Shoulder Flexion AAROM with Dowel  - 1 x daily - 7 x weekly - 2 sets - 10 reps - 10 hold - Standing Shoulder Internal Rotation AAROM with Dowel  - 1 x daily - 7 x weekly - 2 sets - 10 reps - 10 hold - Corner Pec Major Stretch  - 1-2 x daily - 7 x weekly - 1 sets - 5 reps - 30 hold - Standing Lat Pull Down with Resistance - Elbows Bent  - 1 x daily - 7 x weekly - 2 sets - 10 reps - 5 hold - Sidelying Shoulder ER with Towel and Dumbbell  - 1 x daily - 7 x weekly - 2 sets - 10 reps - 2 hold - Sidelying Shoulder Horizontal Abduction  - 1 x daily - 7 x weekly - 2 sets - 10 reps - 2 hold - Sidelying Shoulder Scaption  - 1 x daily - 7 x weekly - 2 sets - 10 reps - 2 hold    ASSESSMENT:  CLINICAL IMPRESSION:   Patient tolerates all exercises today without increased pain .  He continues to be challenged with inclined shoulder flexion. Pt continues to have significant compensation with raising arm overhead but is very aware, working on it throughout the session.  Cont POC to restore full function of Rt UE.   OBJECTIVE IMPAIRMENTS: decreased activity tolerance, decreased endurance, decreased mobility, decreased ROM, decreased strength, impaired perceived functional ability, impaired flexibility, impaired UE functional use, postural dysfunction, and pain.   ACTIVITY LIMITATIONS: carrying, lifting, bending, sleeping, transfers, bathing, toileting, dressing, self feeding, reach over head, and hygiene/grooming  PARTICIPATION LIMITATIONS: meal prep, cleaning, laundry, driving, shopping, community activity, occupation, and yard work  PERSONAL FACTORS: Time since onset of injury/illness/exacerbation and 3+ comorbidities: anxiety,  DM, HTN  are also affecting patient's functional outcome.   REHAB POTENTIAL: Good  CLINICAL DECISION MAKING: Stable/uncomplicated  EVALUATION COMPLEXITY: Low   GOALS: Goals reviewed with patient? No  SHORT TERM GOALS: Target date: 01/06/2023 Pt  will demonstrate appropriate understanding and performance of initially prescribed HEP in order to facilitate improved independence with management of symptoms.  Baseline: HEP provided on eval Goal status:MET   2. Pt will score greater than or equal to 45 on FOTO in order to demonstrate improved perception of function due to symptoms.  Baseline: 29  Goal status: MET  LONG TERM GOALS: Target date: 02/17/2023   Pt will score 64 on FOTO in order to demonstrate improved perception of function due to symptoms. Baseline: 29, 56%  Goal status: ongoing   2.  Pt will demonstrate at least 150 degrees of active shoulder elevation in order to demonstrate improved tolerance to functional movement patterns such as overhead reaching. Baseline: can do this with heavy compensation  Goal status: MET   3.  Pt will demonstrate at least 4+/5 shoulder flex/abduction MMT for improved symmetry of UE strength and improved tolerance to functional movements.  Baseline: deferred on eval given acuity of surgery See above  Goal status: ongoing   4. Pt will report/demonstrate ability to perform upper body dressing with less than 2 point increase in pain on NPS in order to indicate improved tolerance/independence to ADLs.  Baseline: unable to perform, requiring family assist  Goal status:MET   5. Pt will be able to lift at least 40# from floor to waist with less than 2 pt increase in pain in order to facilitate improved tolerance to work tasks.  Baseline: unable  Goal status: ongoing   6. Pt will be able to return to work without limitation of pain or functional (driving, pushing and pulling materials 40-50 lbs)   Baseline: able to drive without issues, not  lifting on restriction  Goal status: ongoing   7. Pt will be able to use his Rt UE while driving, reaching with ease   Baseline: difficulty reaching back and to the radio  Goal status :ongoing   PLAN:  PT FREQUENCY: 2x/week  PT DURATION: 8 weeks  PLANNED INTERVENTIONS: 97164- PT Re-evaluation, 97110-Therapeutic exercises, 97530- Therapeutic activity, 97112- Neuromuscular re-education, 97535- Self Care, 16109- Manual therapy, 97014- Electrical stimulation (unattended), 97016- Vasopneumatic device, Patient/Family education, Balance training, Stair training, Taping, Dry Needling, Joint mobilization, Spinal mobilization, Scar mobilization, Cryotherapy, and Moist heat  PLAN FOR NEXT SESSION:  Upgrade HEP- scapular (try quadruped vs prone)  Progress strength and minimize compensations.  Manual therapy, scapular retraction, posture : STRENGTHEN IN SUPINE AND RECLINED, PROGRESSING TO UPRIGHT.   Jannette Spanner, PTA 03/15/23 10:38 AM Phone: [phone] Fax: [phone]

## 2023-03-17 ENCOUNTER — Ambulatory Visit: Payer: 59 | Admitting: Physical Therapy

## 2023-03-22 ENCOUNTER — Ambulatory Visit: Payer: 59 | Attending: Orthopaedic Surgery | Admitting: Physical Therapy

## 2023-03-22 ENCOUNTER — Encounter: Payer: Self-pay | Admitting: Physical Therapy

## 2023-03-22 DIAGNOSIS — M6281 Muscle weakness (generalized): Secondary | ICD-10-CM | POA: Diagnosis present

## 2023-03-22 DIAGNOSIS — M25511 Pain in right shoulder: Secondary | ICD-10-CM | POA: Insufficient documentation

## 2023-03-22 DIAGNOSIS — M25611 Stiffness of right shoulder, not elsewhere classified: Secondary | ICD-10-CM | POA: Diagnosis present

## 2023-03-22 NOTE — Therapy (Signed)
 OUTPATIENT PHYSICAL THERAPY SHOULDER TREATMENT  Patient Name: [name] [name] MRN: [id number] DOB:Nov 08, 1975, 48 y.o., male Today's Date: 03/22/2023  END OF SESSION:  PT End of Session - 03/22/23 0934     Visit Number 30    Number of Visits 39    Date for PT Re-Evaluation 04/19/23    Authorization Type [employer]    Authorization Time Period 60VL no auth required    Progress Note Due on Visit 20    PT Start Time 0932    PT Stop Time 1012    PT Time Calculation (min) 40 min                Past Medical History:  Diagnosis Date   Anxiety    Diabetes mellitus without complication (HCC)    GERD (gastroesophageal reflux disease)    Hypertension    Obesity (BMI 30-39.9)    Plantar fasciitis of right foot    Skin lesion    Tendonitis    Traumatic tear of left rotator cuff 05/20/2016   Past Surgical History:  Procedure Laterality Date   SHOULDER ACROMIOPLASTY Right 11/23/2022   Procedure: RIGHT SHOULDER ACROMIOPLASTY;  Surgeon: Huel Cote, MD;  Location: [facility];  Service: Orthopedics;  Laterality: Right;   SHOULDER ARTHROSCOPY WITH ROTATOR CUFF REPAIR AND SUBACROMIAL DECOMPRESSION Left 05/21/2016   Procedure: SHOULDER ARTHROSCOPY WITH ROTATOR CUFF REPAIR AND SUBACROMIAL DECOMPRESSION LABRAL DEBRIDEMENT;  Surgeon: Salvatore Marvel, MD;  Location: [facility];  Service: Orthopedics;  Laterality: Left;   VASECTOMY     Patient Active Problem List   Diagnosis Date Noted   Traumatic complete tear of right rotator cuff 11/23/2022   Lab test positive for detection of COVID-19 virus 02/23/2022   Paresthesia 10/23/2018   Traumatic tear of left rotator cuff 05/20/2016   Family history of hypertrophic cardiomyopathy Aug 04, 2014   Family history of sudden cardiac death in son August 04, 2014    PCP: Farris Has, MD  REFERRING PROVIDER: Huel Cote, MD  REFERRING DIAG: [phone] (ICD-10-CM) - Traumatic complete tear of right rotator cuff, initial  encounter  THERAPY DIAG:  Stiffness of right shoulder, not elsewhere classified  Muscle weakness (generalized)  Rationale for Evaluation and Treatment: Rehabilitation  ONSET DATE: 11/23/22 rotator cuff repair, superior capsular reconstruction, acromioplasty  SUBJECTIVE:                                                                                                                                                                                      SUBJECTIVE STATEMENT:  Played golf , 18 holes. Shoulder was sore.    EVAL: In August pt was working on a ladder, fell backwards but  unsure quite how he landed. States he did have a mild concussion but that has since resolved. Previously independent and active for work. Since surgery on 11/23/22, has been receiving assistance from wife and kids for daily tasks. Sleeping in recliner with sling and pillow under arm. Icing 3-4x/day ~61min.  Hand dominance: Right  PERTINENT HISTORY: BIL traumatic RC tears, anxiety, DM, GERD, HTN H/O Lt RCR 2018  Recent note : MD  01/05/23: 6-week status post right shoulder superior capsular reconstruction overall doing very well.  At this time I discussed I would like him to continue through the active range of motion protocol and to work on overhead active range of motion.  Overall he is doing quite well for this stage.  I will plan to see him back in 6 weeks for reassessment   PAIN:  Are you having pain: 0/10 today  Location/description: occasional burning and popping; R shoulder, superiorly Best-worst over past week: 0-7/10  - aggravating factors: sleeping and mornings,  - Easing factors: sling, medication    PRECAUTIONS: s/p R RCR 11/23/22; using Dr. Steward Drone rotator cuff repair protocol per op note   WEIGHT BEARING RESTRICTIONS: Yes NWB surgical limb  FALLS:  Has patient fallen in last 6 months? Yes. 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 Lifts and carries up to ~50#  PLOF: Independent  PATIENT GOALS: get back to playing golf, shooting pool  NEXT MD VISIT: Nov 18th , Feb 12   OBJECTIVE:  Note: Objective measures were completed at Evaluation unless otherwise noted.  DIAGNOSTIC FINDINGS:  S/p shoulder surgery 11/23/22 Panel 1: Right RIGHT SHOULDER ARTHROSCOPY WITH SUPERIOR CAPSULAR RECONSTRUCTION / EXTENSIVE DEBRIDEMENT with Huel Cote, MD  Panel 1: Right RIGHT SHOULDER ACROMIOPLASTY with Huel Cote, MD   PATIENT SURVEYS:  FOTO 29>64 predicted Visit 8 , 12/23/22 : 56%  Visit 16: 01/27/23: NA Visit 23: 02/22/23: 58%:    COGNITION: Overall cognitive status: Within functional limits for tasks assessed     SENSATION/OBSERVATION: Surgical site obscured by bandaging Pt denies any sensory complaints since nerve block wore off  POSTURE: Sling RUE, increased R UT elevation  UPPER EXTREMITY ROM:  ROM Right Eval NT Right 12/01/22 RT 01/10/23 RT 01/25/23 RT  02/01/23 RT 02/17/23 Rt. 02/22/23 Rt.  03/10/23  Shoulder flexion  115  90 deg standing  90 with compensations 155 with compensation  153 155 heavy comp  Shoulder abduction  80    105 with compensation 100    Shoulder internal rotation       Thumb at T12 hand to lumbar  At 90 deg abd supine 80  Shoulder external rotation  18 FR to right top of head     Fingers to T1 At 90 deg abd supine 65  Elbow flexion          Elbow extension          Wrist flexion          Wrist extension           (Blank rows = not tested) (Key: WFL = within functional limits not formally assessed, * = concordant pain, s = stiffness/stretching sensation, NT = not tested)  Comments: deferred given acuity of  surgery, phase 1 on eval, elbow flex/ext AAROM grossly WNL  UPPER EXTREMITY MMT:  MMT Right eval R 12/27/22  Rt. 01/27/23 Rt. 02/22/23 Rt 03/08/23  Shoulder flexion   3-/5 3/5 3/5  Shoulder extension       Shoulder abduction   3-/5 3/5 in available range 4/5  Shoulder extension       Shoulder internal rotation  5 5/5 5/5   Shoulder external rotation  4- 4/5 4/5   Elbow flexion       Elbow extension       Grip strength       (Blank rows = not tested)  (Key: WFL = within functional limits not formally assessed, * = concordant pain, s = stiffness/stretching sensation, NT = not tested)  Comments: deferred given acuity of surgery  SHOULDER SPECIAL TESTS: Deferred given acuity of surgery  JOINT MOBILITY TESTING:  Deferred given acuity of surgery    TREATMENT:         [facility] Adult PT Treatment:                                                DATE: 03/22/23 Therapeutic Exercise: Supine shoulder flexion 4# 10 x 3  6# supine protraction 10 x 3  S/L 5# ER 10 x 3  S/L shoulder abdct 5# 10 x 3  Therapeutic Activity:  Nustep UE only L5  Reclined shoulder flexion AROM x 10,  1# 5 x 4  Qped weight shifting Lateral Qped weight shifting forward and back  Qped protract/retract  Cable Row single arm 13# 2 x 15 Cable shoulder ext 13# 10 x 2  Standing punch green Band x12 Standing scaption with mirror feedback , used AA and eccentric lowering    [facility] Adult PT Treatment:                                                DATE: 03/15/23 Therapeutic Exercise: UBE Reclined shoulder flexion AROM 10 x 3  Supine shoulder flexion  strengthening 4# 3 x 10  Supine 5# protraction 2 x 15 S/L Abdct  4# 10 x 3  S/L ER 4# 10 x 3  Cable Row single arm 10# x 15, 13# x 15  Cable shoulder extension 13# 3 x 10- thumb in/ thumb up   Therapeutic Activity: CKC high plank at counter  added weight shifting  Modalities: Ice pack    [facility] Adult PT Treatment:                                                DATE:  03/10/23 Therapeutic Activity: UBE level 3, 3 min Fw 3 min back   Wall slides flexion and out at a diagonal and across about 1 min each  Semi circle sweeps on the wall  Standing shoulder strength: flexion, scaption x 2 lb x 2 x 10  Closed chain high plank at countertop 30 sec x 3 added forearm plank for last rep  CKC high plank at high mat added shoulder tap  but too much  Bent over shoulder extension 4 lbs  x 10 and then triceps x 10  GTB ER looped x 15  GTB ER looped x 15 with slight flexion  Bent knee reverse fly 3 lbs x 15  Bent over row 10 lbs x 15  Black TB wide high row and lat pull down x 10  Extension  x 10 black band  Added ER in goal post Green band  Hooklying with partial reclined (25 deg) overhead reaching  ER and IR green band at 90 deg    [facility] Adult PT Treatment:                                                DATE: 03/08/23 Therapeutic Activity: UBE 6 min , level 3 , 3 min each direction  Double red band for the following pull x 2x 20  Deltoid row 2 x 15 Shoulder push 2 x 15  Self traction for ROM, flexibility from high bar Bilateral lat pull, scap retraction with double green band  x 15  Reclined 3 lbs slow long lever flexion 2 x 10  Quadruped ER 2 lbs x 15 (at 90 deg abd)  Quadruped scap stability added reach x 10  Sleeper Stretch x 10, gentle for post capsule  Sidelying IR 4 lbs x 20  Sidelying ER with 3 lbs x 20  Scaption 3 lbs x 15  Flexion 3 lbs x 15  Manual:    PROM all planes, MMT   [facility] Adult PT Treatment:                                                DATE: 03/03/23 Therapeutic Activity: UBE 6 min L3 in reverse  ER x 15 small ROM GTB  Push with decr hike in shoulder green 2 x 12  Lat pull down 3 plates 5 sec hold bar at [employer]em[employer]  Shoulder extension with bar, mod cues for spine and scap position Sidefacing Scap row horizontal pull small ROM 2 plates  x 10  Semi reclined AAROM flexion x 10  Short lever 4 lbs x 15 x 3  Long lever 2-4 bs x 10 x 3  supine and reclined S/L abduction 10 x 3, 3#  , S/L abduction 4# x 10 Prone scap retraction x 10 , limited  Extension 3 lbs  x 10 and triceps x 10 [place] fly no wgt. X 10  Row 4 lbs x 15         PATIENT EDUCATION: Education details: Pt education on PT impairments, prognosis, and POC. Informed consent. Rationale for interventions, safe/appropriate HEP performance Person educated: Patient Education method: Explanation, Demonstration, Tactile cues, Verbal cues, and Handouts Education comprehension: verbalized understanding, returned demonstration, verbal cues required, tactile cues required, and needs further education    HOME EXERCISE PROGRAM:  Access Code: [email or web address] URL: [email or web address] Date: 01/14/2023 Prepared by: Karie Mainland Access Code: [email or web address] URL: [email or web address] Date: 03/08/2023 Prepared by: Karie Mainland  Program Notes - with seated elbow flexion, work on gently straightening and bending the elbow with the support of the other arm  Exercises - Standing Elbow Flexion with Resistance  - 1 x daily - 7 x weekly - 2 sets - 10 reps - 5 hold - Standing Shoulder External Rotation  Stretch in Doorway  - 1 x daily - 7 x weekly - 2 sets - 10 reps - 10-20 hold - Standing Shoulder Abduction AAROM with Dowel  - 1 x daily - 7 x weekly - 2 sets - 10 reps - 10 hold - Standing Shoulder Extension with Dowel  - 1 x daily - 7 x weekly - 2 sets - 10 reps - 10 hold - Standing Shoulder Flexion AAROM with Dowel  - 1 x daily - 7 x weekly - 2 sets - 10 reps - 10 hold - Standing Shoulder Internal Rotation AAROM with Dowel  - 1 x daily - 7 x weekly - 2 sets - 10 reps - 10 hold - Corner Pec Major Stretch  - 1-2 x daily - 7 x weekly - 1 sets - 5 reps - 30 hold - Standing Lat Pull Down with Resistance - Elbows Bent  - 1 x daily - 7 x weekly - 2 sets - 10 reps - 5 hold - Sidelying Shoulder ER with Towel and Dumbbell  - 1 x daily - 7 x weekly - 2 sets - 10 reps -  2 hold - Sidelying Shoulder Horizontal Abduction  - 1 x daily - 7 x weekly - 2 sets - 10 reps - 2 hold - Sidelying Shoulder Scaption  - 1 x daily - 7 x weekly - 2 sets - 10 reps - 2 hold    ASSESSMENT:  CLINICAL IMPRESSION:   Patient tolerates all exercises today without increased pain .  He continues to be challenged with inclined shoulder flexion. Pt continues to have significant compensation with raising arm overhead but is very aware, working on it throughout the session. Progressed with Qped for UE weight bearing which is challenging.  Cont POC to restore full function of Rt UE.   OBJECTIVE IMPAIRMENTS: decreased activity tolerance, decreased endurance, decreased mobility, decreased ROM, decreased strength, impaired perceived functional ability, impaired flexibility, impaired UE functional use, postural dysfunction, and pain.   ACTIVITY LIMITATIONS: carrying, lifting, bending, sleeping, transfers, bathing, toileting, dressing, self feeding, reach over head, and hygiene/grooming  PARTICIPATION LIMITATIONS: meal prep, cleaning, laundry, driving, shopping, community activity, occupation, and yard work  PERSONAL FACTORS: Time since onset of injury/illness/exacerbation and 3+ comorbidities: anxiety, DM, HTN  are also affecting patient's functional outcome.   REHAB POTENTIAL: Good  CLINICAL DECISION MAKING: Stable/uncomplicated  EVALUATION COMPLEXITY: Low   GOALS: Goals reviewed with patient? No  SHORT TERM GOALS: Target date: 01/06/2023 Pt will demonstrate appropriate understanding and performance of initially prescribed HEP in order to facilitate improved independence with management of symptoms.  Baseline: HEP provided on eval Goal status:MET   2. Pt will score greater than or equal to 45 on FOTO in order to demonstrate improved perception of function due to symptoms.  Baseline: 29  Goal status: MET  LONG TERM GOALS: Target date: 02/17/2023   Pt will score 64 on FOTO in order  to demonstrate improved perception of function due to symptoms. Baseline: 29, 56%  Goal status: ongoing   2.  Pt will demonstrate at least 150 degrees of active shoulder elevation in order to demonstrate improved tolerance to functional movement patterns such as overhead reaching. Baseline: can do this with heavy compensation  Goal status: MET   3.  Pt will demonstrate at least 4+/5 shoulder flex/abduction MMT for improved symmetry of UE strength and improved tolerance to functional movements.  Baseline: deferred on eval given acuity of surgery See above  Goal status: ongoing  4. Pt will report/demonstrate ability to perform upper body dressing with less than 2 point increase in pain on NPS in order to indicate improved tolerance/independence to ADLs.  Baseline: unable to perform, requiring family assist  Goal status:MET   5. Pt will be able to lift at least 40# from floor to waist with less than 2 pt increase in pain in order to facilitate improved tolerance to work tasks.  Baseline: unable  Goal status: ongoing   6. Pt will be able to return to work without limitation of pain or functional (driving, pushing and pulling materials 40-50 lbs)   Baseline: able to drive without issues, not lifting on restriction  Goal status: ongoing   7. Pt will be able to use his Rt UE while driving, reaching with ease   Baseline: difficulty reaching back and to the radio  Goal status :ongoing   PLAN:  PT FREQUENCY: 2x/week  PT DURATION: 8 weeks  PLANNED INTERVENTIONS: 97164- PT Re-evaluation, 97110-Therapeutic exercises, 97530- Therapeutic activity, 97112- Neuromuscular re-education, 97535- Self Care, 16109- Manual therapy, 97014- Electrical stimulation (unattended), 97016- Vasopneumatic device, Patient/Family education, Balance training, Stair training, Taping, Dry Needling, Joint mobilization, Spinal mobilization, Scar mobilization, Cryotherapy, and Moist heat  PLAN FOR NEXT SESSION:  Upgrade  HEP- scapular (try quadruped vs prone)  Progress strength and minimize compensations.  Manual therapy, scapular retraction, posture : STRENGTHEN IN SUPINE AND RECLINED, PROGRESSING TO UPRIGHT.   Jannette Spanner, PTA 03/22/23 11:11 AM Phone: [phone] Fax: [phone]

## 2023-03-23 NOTE — Therapy (Unsigned)
 OUTPATIENT PHYSICAL THERAPY SHOULDER TREATMENT  Patient Name: [name] [name] MRN: [id number] DOB:1975-08-17, 48 y.o., male Today's Date: 03/24/2023  END OF SESSION:  PT End of Session - 03/24/23 0935     Visit Number 31    Number of Visits 39    Date for PT Re-Evaluation 04/19/23    Authorization Type [employer]    Authorization Time Period 60VL no auth required    Progress Note Due on Visit 20    PT Start Time 0934    PT Stop Time 1020    PT Time Calculation (min) 46 min    Activity Tolerance Patient tolerated treatment well    Behavior During Therapy WFL for tasks assessed/performed                 Past Medical History:  Diagnosis Date   Anxiety    Diabetes mellitus without complication (HCC)    GERD (gastroesophageal reflux disease)    Hypertension    Obesity (BMI 30-39.9)    Plantar fasciitis of right foot    Skin lesion    Tendonitis    Traumatic tear of left rotator cuff 05/20/2016   Past Surgical History:  Procedure Laterality Date   SHOULDER ACROMIOPLASTY Right 11/23/2022   Procedure: RIGHT SHOULDER ACROMIOPLASTY;  Surgeon: Huel Cote, MD;  Location: [facility];  Service: Orthopedics;  Laterality: Right;   SHOULDER ARTHROSCOPY WITH ROTATOR CUFF REPAIR AND SUBACROMIAL DECOMPRESSION Left 05/21/2016   Procedure: SHOULDER ARTHROSCOPY WITH ROTATOR CUFF REPAIR AND SUBACROMIAL DECOMPRESSION LABRAL DEBRIDEMENT;  Surgeon: Salvatore Marvel, MD;  Location:  SURGERY CENTER;  Service: Orthopedics;  Laterality: Left;   VASECTOMY     Patient Active Problem List   Diagnosis Date Noted   Traumatic complete tear of right rotator cuff 11/23/2022   Lab test positive for detection of COVID-19 virus 02/23/2022   Paresthesia 10/23/2018   Traumatic tear of left rotator cuff 05/20/2016   Family history of hypertrophic cardiomyopathy 20-Aug-2014   Family history of sudden cardiac death in son 08-20-2014    PCP: Farris Has, MD  REFERRING PROVIDER:  Huel Cote, MD  REFERRING DIAG: [id number] (ICD-10-CM) - Traumatic complete tear of right rotator cuff, initial encounter  THERAPY DIAG:  Stiffness of right shoulder, not elsewhere classified  Muscle weakness (generalized)  Right shoulder pain, unspecified chronicity  Rationale for Evaluation and Treatment: Rehabilitation  ONSET DATE: 11/23/22 rotator cuff repair, superior capsular reconstruction, acromioplasty  SUBJECTIVE:  SUBJECTIVE STATEMENT:  Patient reports min pain today in Rt shoulder.   EVAL: In August pt was working on a ladder, fell backwards but unsure quite how he landed. States he did have a mild concussion but that has since resolved. Previously independent and active for work. Since surgery on 11/23/22, has been receiving assistance from wife and kids for daily tasks. Sleeping in recliner with sling and pillow under arm. Icing 3-4x/day ~64min.  Hand dominance: Right  PERTINENT HISTORY: BIL traumatic RC tears, anxiety, DM, GERD, HTN H/O Lt RCR 2018  Recent note : MD  01/05/23: 6-week status post right shoulder superior capsular reconstruction overall doing very well.  At this time I discussed I would like him to continue through the active range of motion protocol and to work on overhead active range of motion.  Overall he is doing quite well for this stage.  I will plan to see him back in 6 weeks for reassessment   PAIN:  Are you having pain: 1/10 today  Location/description: occasional burning and popping; R shoulder, superiorly Best-worst over past week: 0-7/10  - aggravating factors: sleeping and mornings,  - Easing factors: sling, medication    PRECAUTIONS: s/p R RCR 11/23/22; using Dr. Steward Drone rotator cuff repair protocol per op note   WEIGHT BEARING RESTRICTIONS: Yes NWB surgical  limb  FALLS:  Has patient fallen in last 6 months? Yes. 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 Lifts and carries up to ~50#  PLOF: Independent  PATIENT GOALS: get back to playing golf, shooting pool  NEXT MD VISIT: Nov 18th , Feb 12   OBJECTIVE:  Note: Objective measures were completed at Evaluation unless otherwise noted.  DIAGNOSTIC FINDINGS:  S/p shoulder surgery 11/23/22 Panel 1: Right RIGHT SHOULDER ARTHROSCOPY WITH SUPERIOR CAPSULAR RECONSTRUCTION / EXTENSIVE DEBRIDEMENT with Huel Cote, MD  Panel 1: Right RIGHT SHOULDER ACROMIOPLASTY with Huel Cote, MD   PATIENT SURVEYS:  FOTO 29>64 predicted Visit 8 , 12/23/22 : 56%  Visit 16: 01/27/23: NA Visit 23: 02/22/23: 58%:    COGNITION: Overall cognitive status: Within functional limits for tasks assessed     SENSATION/OBSERVATION: Surgical site obscured by bandaging Pt denies any sensory complaints since nerve block wore off  POSTURE: Sling RUE, increased R UT elevation  UPPER EXTREMITY ROM:  ROM Right Eval NT Right 12/01/22 RT 01/10/23 RT 01/25/23 RT  02/01/23 RT 02/17/23 Rt. 02/22/23 Rt.  03/10/23  Shoulder flexion  115  90 deg standing  90 with compensations 155 with compensation  153 155 heavy comp  Shoulder abduction  80    105 with compensation 100    Shoulder internal rotation       Thumb at T12 hand to lumbar  At 90 deg abd supine 80  Shoulder external rotation  18 FR to right top of head     Fingers to T1 At 90 deg abd supine 65  Elbow flexion          Elbow extension          Wrist flexion          Wrist extension           (Blank rows = not tested) (Key: [facility]  =  within functional limits not formally assessed, * = concordant pain, s = stiffness/stretching sensation, NT = not tested)  Comments: deferred given acuity of surgery, phase 1 on eval, elbow flex/ext AAROM grossly WNL  UPPER EXTREMITY MMT:  MMT Right eval R 12/27/22  Rt. 01/27/23 Rt. 02/22/23 Rt 03/08/23  Shoulder flexion   3-/5 3/5 3/5  Shoulder extension       Shoulder abduction   3-/5 3/5 in available range 4/5  Shoulder extension       Shoulder internal rotation  5 5/5 5/5   Shoulder external rotation  4- 4/5 4/5   Elbow flexion       Elbow extension       Grip strength       (Blank rows = not tested)  (Key: WFL = within functional limits not formally assessed, * = concordant pain, s = stiffness/stretching sensation, NT = not tested)  Comments: deferred given acuity of surgery  SHOULDER SPECIAL TESTS: Deferred given acuity of surgery  JOINT MOBILITY TESTING:  Deferred given acuity of surgery    TREATMENT:          [facility] Adult PT Treatment:                                                DATE: 03/24/23 Therapeutic Activity: UBE 6 min L3, 3 min each direction  Pendulum 5 lbs x 1 min Bent over horizontal abd Rt UE 4 lbs x 15  Bent over flexion Rt UE 4 lbs x 10  Triceps 7 plates x 15  Extension 7 plates x 15  ER 1 plate x 15  Shoulder flexion Rt UE palm up green Thera-band 2 x 10 Shoulder abduction green band x 2 x 10 Underhand lat pul down 35 lbs x 15  Seated row 25 lbs x 15, 35 lbs x 15 narrow pull  Seated chest press 35 lbs , scapula protraction/retraction  Sidelying ER, scaption and flexion ,3 lbs x 2 x 10 each  Supine AAROM flexion with dowel long hold RLE 5 lbs supine flexion x 15 long arm  Skull crusher 5 lbs x 15    [facility] Adult PT Treatment:                                                DATE: 03/22/23 Therapeutic Exercise: Supine shoulder flexion 4# 10 x 3  6# supine protraction 10 x 3  S/L 5# ER 10 x 3  S/L shoulder abdct 5# 10 x 3  Therapeutic Activity:   Nustep UE only L5  Reclined shoulder flexion AROM x 10,  1# 5 x 4  Qped weight shifting Lateral Qped weight shifting forward and back  Qped protract/retract  Cable Row single arm 13# 2 x 15 Cable shoulder ext 13# 10 x 2  Standing punch green Band x12 Standing scaption with mirror feedback , used AA and eccentric lowering    [facility] Adult PT Treatment:                                                DATE:  03/15/23 Therapeutic Exercise: UBE Reclined shoulder flexion AROM 10 x 3  Supine shoulder flexion  strengthening 4# 3 x 10  Supine 5# protraction 2 x 15 S/L Abdct  4# 10 x 3  S/L ER 4# 10 x 3  Cable Row single arm 10# x 15, 13# x 15  Cable shoulder extension 13# 3 x 10- thumb in/ thumb up   Therapeutic Activity: CKC high plank at counter  added weight shifting  Modalities: Ice pack    [facility] Adult PT Treatment:                                                DATE: 03/10/23 Therapeutic Activity: UBE level 3, 3 min Fw 3 min back   Wall slides flexion and out at a diagonal and across about 1 min each  Semi circle sweeps on the wall  Standing shoulder strength: flexion, scaption x 2 lb x 2 x 10  Closed chain high plank at countertop 30 sec x 3 added forearm plank for last rep  CKC high plank at high mat added shoulder tap  but too much  Bent over shoulder extension 4 lbs x 10 and then triceps x 10  GTB ER looped x 15  GTB ER looped x 15 with slight flexion  Bent knee reverse fly 3 lbs x 15  Bent over row 10 lbs x 15  Black TB wide high row and lat pull down x 10  Extension  x 10 black band  Added ER in goal post Green band  Hooklying with partial reclined (25 deg) overhead reaching  ER and IR green band at 90 deg    [facility] Adult PT Treatment:                                                DATE: 03/08/23 Therapeutic Activity: UBE 6 min , level 3 , 3 min each direction  Double red band for the following pull x 2x 20  Deltoid row 2 x 15 Shoulder push 2 x 15  Self traction for  ROM, flexibility from high bar Bilateral lat pull, scap retraction with double green band  x 15  Reclined 3 lbs slow long lever flexion 2 x 10  Quadruped ER 2 lbs x 15 (at 90 deg abd)  Quadruped scap stability added reach x 10  Sleeper Stretch x 10, gentle for post capsule  Sidelying IR 4 lbs x 20  Sidelying ER with 3 lbs x 20  Scaption 3 lbs x 15  Flexion 3 lbs x 15  Manual:    PROM all planes, MMT   [facility] Adult PT Treatment:                                                DATE: 03/03/23 Therapeutic Activity: UBE 6 min L3 in reverse  ER x 15 small ROM GTB  Push with decr hike in shoulder green 2 x 12  Lat pull down 3 plates 5 sec hold bar at [employer]  Shoulder extension with bar, mod cues for spine and scap position  Sidefacing Scap row horizontal pull small ROM 2 plates  x 10  Semi reclined AAROM flexion x 10  Short lever 4 lbs x 15 x 3  Long lever 2-4 bs x 10 x 3 supine and reclined S/L abduction 10 x 3, 3#  , S/L abduction 4# x 10 Prone scap retraction x 10 , limited  Extension 3 lbs  x 10 and triceps x 10 [place] fly no wgt. X 10  Row 4 lbs x 15   PATIENT EDUCATION: Education details: Pt education on PT impairments, prognosis, and POC. Informed consent. Rationale for interventions, safe/appropriate HEP performance Person educated: Patient Education method: Explanation, Demonstration, Tactile cues, Verbal cues, and Handouts Education comprehension: verbalized understanding, returned demonstration, verbal cues required, tactile cues required, and needs further education    HOME EXERCISE PROGRAM:  Access Code: [email or web address] URL: [email or web address] Date: 01/14/2023 Prepared by: Karie Mainland Access Code: [email or web address] URL: [email or web address] Date: 03/08/2023 Prepared by: Karie Mainland  Program Notes - with seated elbow flexion, work on gently straightening and bending the elbow with the support of the other arm  Exercises - Standing Elbow Flexion  with Resistance  - 1 x daily - 7 x weekly - 2 sets - 10 reps - 5 hold - Standing Shoulder External Rotation Stretch in Doorway  - 1 x daily - 7 x weekly - 2 sets - 10 reps - 10-20 hold - Standing Shoulder Abduction AAROM with Dowel  - 1 x daily - 7 x weekly - 2 sets - 10 reps - 10 hold - Standing Shoulder Extension with Dowel  - 1 x daily - 7 x weekly - 2 sets - 10 reps - 10 hold - Standing Shoulder Flexion AAROM with Dowel  - 1 x daily - 7 x weekly - 2 sets - 10 reps - 10 hold - Standing Shoulder Internal Rotation AAROM with Dowel  - 1 x daily - 7 x weekly - 2 sets - 10 reps - 10 hold - Corner Pec Major Stretch  - 1-2 x daily - 7 x weekly - 1 sets - 5 reps - 30 hold - Standing Lat Pull Down with Resistance - Elbows Bent  - 1 x daily - 7 x weekly - 2 sets - 10 reps - 5 hold - Sidelying Shoulder ER with Towel and Dumbbell  - 1 x daily - 7 x weekly - 2 sets - 10 reps - 2 hold - Sidelying Shoulder Horizontal Abduction  - 1 x daily - 7 x weekly - 2 sets - 10 reps - 2 hold - Sidelying Shoulder Scaption  - 1 x daily - 7 x weekly - 2 sets - 10 reps - 2 hold    ASSESSMENT:  CLINICAL IMPRESSION:   Patient tolerates decent amount of load with pushing and pull exercises.  He continues to show significant compensation patterns in right shoulder abduction and overhead lifting.  Resistance needs to be modified a great deal in order to achieve desired range of motion.  Overall pain is minimal and he has been able to return to some recreational activities including golf with soreness.  He will continue to benefit from skilled physical therapy to maximize functional outcome.   OBJECTIVE IMPAIRMENTS: decreased activity tolerance, decreased endurance, decreased mobility, decreased ROM, decreased strength, impaired perceived functional ability, impaired flexibility, impaired UE functional use, postural dysfunction, and pain.   ACTIVITY LIMITATIONS: carrying, lifting, bending, sleeping, transfers, bathing,  toileting, dressing, self feeding, reach over head, and hygiene/grooming  PARTICIPATION LIMITATIONS: meal  prep, cleaning, laundry, driving, shopping, community activity, occupation, and yard work  PERSONAL FACTORS: Time since onset of injury/illness/exacerbation and 3+ comorbidities: anxiety, DM, HTN  are also affecting patient's functional outcome.   REHAB POTENTIAL: Good  CLINICAL DECISION MAKING: Stable/uncomplicated  EVALUATION COMPLEXITY: Low   GOALS: Goals reviewed with patient? No  SHORT TERM GOALS: Target date: 01/06/2023 Pt will demonstrate appropriate understanding and performance of initially prescribed HEP in order to facilitate improved independence with management of symptoms.  Baseline: HEP provided on eval Goal status:MET   2. Pt will score greater than or equal to 45 on FOTO in order to demonstrate improved perception of function due to symptoms.  Baseline: 29  Goal status: MET  LONG TERM GOALS: Target date: 02/17/2023   Pt will score 64 on FOTO in order to demonstrate improved perception of function due to symptoms. Baseline: 29, 56%  Goal status: ongoing   2.  Pt will demonstrate at least 150 degrees of active shoulder elevation in order to demonstrate improved tolerance to functional movement patterns such as overhead reaching. Baseline: can do this with heavy compensation  Goal status: MET   3.  Pt will demonstrate at least 4+/5 shoulder flex/abduction MMT for improved symmetry of UE strength and improved tolerance to functional movements.  Baseline: deferred on eval given acuity of surgery See above  Goal status: ongoing   4. Pt will report/demonstrate ability to perform upper body dressing with less than 2 point increase in pain on NPS in order to indicate improved tolerance/independence to ADLs.  Baseline: unable to perform, requiring family assist  Goal status:MET   5. Pt will be able to lift at least 40# from floor to waist with less than 2 pt  increase in pain in order to facilitate improved tolerance to work tasks.  Baseline: unable  Goal status: ongoing   6. Pt will be able to return to work without limitation of pain or functional (driving, pushing and pulling materials 40-50 lbs)   Baseline: able to drive without issues, not lifting on restriction  Goal status: ongoing   7. Pt will be able to use his Rt UE while driving, reaching with ease   Baseline: difficulty reaching back and to the radio  Goal status :ongoing   PLAN:  PT FREQUENCY: 2x/week  PT DURATION: 8 weeks  PLANNED INTERVENTIONS: 97164- PT Re-evaluation, 97110-Therapeutic exercises, 97530- Therapeutic activity, 97112- Neuromuscular re-education, 97535- Self Care, 16109- Manual therapy, 97014- Electrical stimulation (unattended), 97016- Vasopneumatic device, Patient/Family education, Balance training, Stair training, Taping, Dry Needling, Joint mobilization, Spinal mobilization, Scar mobilization, Cryotherapy, and Moist heat  PLAN FOR NEXT SESSION:  Upgrade HEP- scapular (try quadruped vs prone)  Progress strength and minimize compensations.  Manual therapy, scapular retraction, posture : STRENGTHEN IN SUPINE AND RECLINED, PROGRESSING TO UPRIGHT.  Karie Mainland, PT 03/24/23 10:22 AM Phone: [phone] Fax: [phone]

## 2023-03-24 ENCOUNTER — Ambulatory Visit: Payer: 59 | Admitting: Physical Therapy

## 2023-03-24 ENCOUNTER — Encounter: Admitting: Physical Therapy

## 2023-03-24 ENCOUNTER — Encounter: Payer: Self-pay | Admitting: Physical Therapy

## 2023-03-24 DIAGNOSIS — M25611 Stiffness of right shoulder, not elsewhere classified: Secondary | ICD-10-CM

## 2023-03-24 DIAGNOSIS — M6281 Muscle weakness (generalized): Secondary | ICD-10-CM

## 2023-03-24 DIAGNOSIS — M25511 Pain in right shoulder: Secondary | ICD-10-CM

## 2023-03-29 ENCOUNTER — Ambulatory Visit: Payer: 59 | Admitting: Physical Therapy

## 2023-03-29 ENCOUNTER — Encounter: Payer: Self-pay | Admitting: Physical Therapy

## 2023-03-29 DIAGNOSIS — M25611 Stiffness of right shoulder, not elsewhere classified: Secondary | ICD-10-CM | POA: Diagnosis not present

## 2023-03-29 DIAGNOSIS — M6281 Muscle weakness (generalized): Secondary | ICD-10-CM

## 2023-03-29 NOTE — Therapy (Signed)
 OUTPATIENT PHYSICAL THERAPY SHOULDER TREATMENT  Patient Name: [name] [name] MRN: [id number] DOB:02-07-1975, 48 y.o., male Today's Date: 03/29/2023  END OF SESSION:  PT End of Session - 03/29/23 0929     Visit Number 32    Number of Visits 39    Date for PT Re-Evaluation 04/19/23    Authorization Type [employer]    Authorization Time Period 60VL no auth required    PT Start Time 0930    PT Stop Time 1015    PT Time Calculation (min) 45 min                 Past Medical History:  Diagnosis Date   Anxiety    Diabetes mellitus without complication (HCC)    GERD (gastroesophageal reflux disease)    Hypertension    Obesity (BMI 30-39.9)    Plantar fasciitis of right foot    Skin lesion    Tendonitis    Traumatic tear of left rotator cuff 05/20/2016   Past Surgical History:  Procedure Laterality Date   SHOULDER ACROMIOPLASTY Right 11/23/2022   Procedure: RIGHT SHOULDER ACROMIOPLASTY;  Surgeon: Huel Cote, MD;  Location: [facility];  Service: Orthopedics;  Laterality: Right;   SHOULDER ARTHROSCOPY WITH ROTATOR CUFF REPAIR AND SUBACROMIAL DECOMPRESSION Left 05/21/2016   Procedure: SHOULDER ARTHROSCOPY WITH ROTATOR CUFF REPAIR AND SUBACROMIAL DECOMPRESSION LABRAL DEBRIDEMENT;  Surgeon: Salvatore Marvel, MD;  Location: [facility];  Service: Orthopedics;  Laterality: Left;   VASECTOMY     Patient Active Problem List   Diagnosis Date Noted   Traumatic complete tear of right rotator cuff 11/23/2022   Lab test positive for detection of COVID-19 virus 02/23/2022   Paresthesia 10/23/2018   Traumatic tear of left rotator cuff 05/20/2016   Family history of hypertrophic cardiomyopathy 07/27/14   Family history of sudden cardiac death in son 27-Jul-2014    PCP: Farris Has, MD  REFERRING PROVIDER: Huel Cote, MD  REFERRING DIAG: [phone] (ICD-10-CM) - Traumatic complete tear of right rotator cuff, initial encounter  THERAPY DIAG:   Stiffness of right shoulder, not elsewhere classified  Muscle weakness (generalized)  Rationale for Evaluation and Treatment: Rehabilitation  ONSET DATE: 11/23/22 rotator cuff repair, superior capsular reconstruction, acromioplasty  SUBJECTIVE:                                                                                                                                                                                      SUBJECTIVE STATEMENT:  Usual soreness in shoulder   EVAL: In August pt was working on a ladder, fell backwards but unsure quite how he landed. States he did have a mild concussion but  that has since resolved. Previously independent and active for work. Since surgery on 11/23/22, has been receiving assistance from wife and kids for daily tasks. Sleeping in recliner with sling and pillow under arm. Icing 3-4x/day ~30min.  Hand dominance: Right  PERTINENT HISTORY: BIL traumatic RC tears, anxiety, DM, GERD, HTN H/O Lt RCR 2018  Recent note : MD  01/05/23: 6-week status post right shoulder superior capsular reconstruction overall doing very well.  At this time I discussed I would like him to continue through the active range of motion protocol and to work on overhead active range of motion.  Overall he is doing quite well for this stage.  I will plan to see him back in 6 weeks for reassessment   PAIN:  Are you having pain: 1/10 today  Location/description: occasional burning and popping; R shoulder, superiorly Best-worst over past week: 0-7/10  - aggravating factors: sleeping and mornings,  - Easing factors: sling, medication    PRECAUTIONS: s/p R RCR 11/23/22; using Dr. Steward Drone rotator cuff repair protocol per op note   WEIGHT BEARING RESTRICTIONS: Yes NWB surgical limb  FALLS:  Has patient fallen in last 6 months? Yes. 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 Lifts and carries up to ~50#  PLOF: Independent  PATIENT GOALS: get back to playing golf, shooting pool  NEXT MD VISIT: Nov 18th , Feb 12 , may ?  OBJECTIVE:  Note: Objective measures were completed at Evaluation unless otherwise noted.  DIAGNOSTIC FINDINGS:  S/p shoulder surgery 11/23/22 Panel 1: Right RIGHT SHOULDER ARTHROSCOPY WITH SUPERIOR CAPSULAR RECONSTRUCTION / EXTENSIVE DEBRIDEMENT with Huel Cote, MD  Panel 1: Right RIGHT SHOULDER ACROMIOPLASTY with Huel Cote, MD   PATIENT SURVEYS:  FOTO 29>64 predicted Visit 8 , 12/23/22 : 56%  Visit 16: 01/27/23: NA Visit 23: 02/22/23: 58%:    COGNITION: Overall cognitive status: Within functional limits for tasks assessed     SENSATION/OBSERVATION: Surgical site obscured by bandaging Pt denies any sensory complaints since nerve block wore off  POSTURE: Sling RUE, increased R UT elevation  UPPER EXTREMITY ROM:  ROM Right Eval NT Right 12/01/22 RT 01/10/23 RT 01/25/23 RT  02/01/23 RT 02/17/23 Rt. 02/22/23 Rt.  03/10/23  Shoulder flexion  115  90 deg standing  90 with compensations 155 with compensation  153 155 heavy comp  Shoulder abduction  80    105 with compensation 100    Shoulder internal rotation       Thumb at T12 hand to lumbar  At 90 deg abd supine 80  Shoulder external rotation  18 FR to right top of head     Fingers to T1 At 90 deg abd supine 65  Elbow flexion          Elbow extension          Wrist flexion          Wrist extension           (Blank rows = not tested) (Key: WFL = within functional limits not formally assessed, * = concordant pain, s = stiffness/stretching sensation, NT = not tested)  Comments: deferred given acuity of surgery, phase 1 on eval, elbow  flex/ext AAROM grossly WNL  UPPER EXTREMITY MMT:  MMT Right eval R 12/27/22  Rt. 01/27/23 Rt. 02/22/23 Rt 03/08/23  Shoulder flexion   3-/5 3/5 3/5  Shoulder extension       Shoulder abduction   3-/5 3/5 in available range 4/5  Shoulder extension       Shoulder internal rotation  5 5/5 5/5   Shoulder external rotation  4- 4/5 4/5   Elbow flexion       Elbow extension       Grip strength       (Blank rows = not tested)  (Key: WFL = within functional limits not formally assessed, * = concordant pain, s = stiffness/stretching sensation, NT = not tested)  Comments: deferred given acuity of surgery  SHOULDER SPECIAL TESTS: Deferred given acuity of surgery  JOINT MOBILITY TESTING:  Deferred given acuity of surgery    TREATMENT:         [facility] Adult PT Treatment:                                                DATE: 03/29/23 Therapeutic Exercise: UBE Pulleys Tricep ext 20#  Ext 13# x 10, 17# x 10  Standing horiz abdct 10 x 2 GTB Standing lower trap wall slides with lift off x10  ER 3# side stepping and ER AROM x 10 each  IR 7# side stepping and IR EROM x 10 each  Single arm cable row 17# x 10, 20# x 15  Single arm protraction (seated chest press)  5# x 10, 10# x 10  Reclined 2# shoulder flexion 10 x 3 Reclined horiz abdct blue 10 x 2     [facility] Adult PT Treatment:                                                DATE: 03/24/23 Therapeutic Activity: UBE 6 min L3, 3 min each direction  Pendulum 5 lbs x 1 min Bent over horizontal abd Rt UE 4 lbs x 15  Bent over flexion Rt UE 4 lbs x 10  Triceps 7 plates x 15  Extension 7 plates x 15  ER 1 plate x 15  Shoulder flexion Rt UE palm up green Thera-band 2 x 10 Shoulder abduction green band x 2 x 10 Underhand lat pul down 35 lbs x 15  Seated row 25 lbs x 15, 35 lbs x 15 narrow pull  Seated chest press 35 lbs , scapula protraction/retraction  Sidelying ER, scaption and flexion ,3 lbs x 2 x 10 each  Supine AAROM flexion with dowel long  hold RLE 5 lbs supine flexion x 15 long arm  Skull crusher 5 lbs x 15    [facility] Adult PT Treatment:                                                DATE: 03/22/23 Therapeutic Exercise: Supine shoulder flexion 4# 10 x 3  6# supine protraction 10 x 3  S/L 5# ER 10 x 3  S/L shoulder abdct 5# 10 x 3  Therapeutic Activity:  Nustep UE only  L5  Reclined shoulder flexion AROM x 10,  1# 5 x 4  Qped weight shifting Lateral Qped weight shifting forward and back  Qped protract/retract  Cable Row single arm 13# 2 x 15 Cable shoulder ext 13# 10 x 2  Standing punch green Band x12 Standing scaption with mirror feedback , used AA and eccentric lowering    [facility] Adult PT Treatment:                                                DATE: 03/15/23 Therapeutic Exercise: UBE Reclined shoulder flexion AROM 10 x 3  Supine shoulder flexion  strengthening 4# 3 x 10  Supine 5# protraction 2 x 15 S/L Abdct  4# 10 x 3  S/L ER 4# 10 x 3  Cable Row single arm 10# x 15, 13# x 15  Cable shoulder extension 13# 3 x 10- thumb in/ thumb up   Therapeutic Activity: CKC high plank at counter  added weight shifting  Modalities: Ice pack    [facility] Adult PT Treatment:                                                DATE: 03/10/23 Therapeutic Activity: UBE level 3, 3 min Fw 3 min back   Wall slides flexion and out at a diagonal and across about 1 min each  Semi circle sweeps on the wall  Standing shoulder strength: flexion, scaption x 2 lb x 2 x 10  Closed chain high plank at countertop 30 sec x 3 added forearm plank for last rep  CKC high plank at high mat added shoulder tap  but too much  Bent over shoulder extension 4 lbs x 10 and then triceps x 10  GTB ER looped x 15  GTB ER looped x 15 with slight flexion  Bent knee reverse fly 3 lbs x 15  Bent over row 10 lbs x 15  Black TB wide high row and lat pull down x 10  Extension  x 10 black band  Added ER in goal post Green band  Hooklying with partial reclined (25  deg) overhead reaching  ER and IR green band at 90 deg     PATIENT EDUCATION: Education details: Pt education on PT impairments, prognosis, and POC. Informed consent. Rationale for interventions, safe/appropriate HEP performance Person educated: Patient Education method: Explanation, Demonstration, Tactile cues, Verbal cues, and Handouts Education comprehension: verbalized understanding, returned demonstration, verbal cues required, tactile cues required, and needs further education    HOME EXERCISE PROGRAM:  Access Code: [email or web address] URL: [email or web address] Date: 01/14/2023 Prepared by: Karie Mainland Access Code: [email or web address] URL: [email or web address] Date: 03/08/2023 Prepared by: Karie Mainland  Program Notes - with seated elbow flexion, work on gently straightening and bending the elbow with the support of the other arm  Exercises - Standing Elbow Flexion with Resistance  - 1 x daily - 7 x weekly - 2 sets - 10 reps - 5 hold - Standing Shoulder External Rotation Stretch in Doorway  - 1 x daily - 7 x weekly - 2 sets - 10 reps - 10-20 hold - Standing Shoulder Abduction AAROM with Dowel  - 1 x daily - 7 x weekly - 2 sets -  10 reps - 10 hold - Standing Shoulder Extension with Dowel  - 1 x daily - 7 x weekly - 2 sets - 10 reps - 10 hold - Standing Shoulder Flexion AAROM with Dowel  - 1 x daily - 7 x weekly - 2 sets - 10 reps - 10 hold - Standing Shoulder Internal Rotation AAROM with Dowel  - 1 x daily - 7 x weekly - 2 sets - 10 reps - 10 hold - Corner Pec Major Stretch  - 1-2 x daily - 7 x weekly - 1 sets - 5 reps - 30 hold - Standing Lat Pull Down with Resistance - Elbows Bent  - 1 x daily - 7 x weekly - 2 sets - 10 reps - 5 hold - Sidelying Shoulder ER with Towel and Dumbbell  - 1 x daily - 7 x weekly - 2 sets - 10 reps - 2 hold - Sidelying Shoulder Horizontal Abduction  - 1 x daily - 7 x weekly - 2 sets - 10 reps - 2 hold - Sidelying Shoulder Scaption  - 1 x  daily - 7 x weekly - 2 sets - 10 reps - 2 hold    ASSESSMENT:  CLINICAL IMPRESSION:   Continued progressive strengthening per post of RCR protocol. Continues with compensations and weakness of right shoulder. More emphasis on posterior chain components today. He will continue to benefit from skilled physical therapy to maximize functional outcome.   OBJECTIVE IMPAIRMENTS: decreased activity tolerance, decreased endurance, decreased mobility, decreased ROM, decreased strength, impaired perceived functional ability, impaired flexibility, impaired UE functional use, postural dysfunction, and pain.   ACTIVITY LIMITATIONS: carrying, lifting, bending, sleeping, transfers, bathing, toileting, dressing, self feeding, reach over head, and hygiene/grooming  PARTICIPATION LIMITATIONS: meal prep, cleaning, laundry, driving, shopping, community activity, occupation, and yard work  PERSONAL FACTORS: Time since onset of injury/illness/exacerbation and 3+ comorbidities: anxiety, DM, HTN  are also affecting patient's functional outcome.   REHAB POTENTIAL: Good  CLINICAL DECISION MAKING: Stable/uncomplicated  EVALUATION COMPLEXITY: Low   GOALS: Goals reviewed with patient? No  SHORT TERM GOALS: Target date: 01/06/2023 Pt will demonstrate appropriate understanding and performance of initially prescribed HEP in order to facilitate improved independence with management of symptoms.  Baseline: HEP provided on eval Goal status:MET   2. Pt will score greater than or equal to 45 on FOTO in order to demonstrate improved perception of function due to symptoms.  Baseline: 29  Goal status: MET  LONG TERM GOALS: Target date: 02/17/2023   Pt will score 64 on FOTO in order to demonstrate improved perception of function due to symptoms. Baseline: 29, 56%  Goal status: ongoing   2.  Pt will demonstrate at least 150 degrees of active shoulder elevation in order to demonstrate improved tolerance to functional  movement patterns such as overhead reaching. Baseline: can do this with heavy compensation  Goal status: MET   3.  Pt will demonstrate at least 4+/5 shoulder flex/abduction MMT for improved symmetry of UE strength and improved tolerance to functional movements.  Baseline: deferred on eval given acuity of surgery See above  Goal status: ongoing   4. Pt will report/demonstrate ability to perform upper body dressing with less than 2 point increase in pain on NPS in order to indicate improved tolerance/independence to ADLs.  Baseline: unable to perform, requiring family assist  Goal status:MET   5. Pt will be able to lift at least 40# from floor to waist with less than 2 pt increase in  pain in order to facilitate improved tolerance to work tasks.  Baseline: unable  Goal status: ongoing   6. Pt will be able to return to work without limitation of pain or functional (driving, pushing and pulling materials 40-50 lbs)   Baseline: able to drive without issues, not lifting on restriction  Goal status: ongoing   7. Pt will be able to use his Rt UE while driving, reaching with ease   Baseline: difficulty reaching back and to the radio  Goal status :ongoing   PLAN:  PT FREQUENCY: 2x/week  PT DURATION: 8 weeks  PLANNED INTERVENTIONS: 97164- PT Re-evaluation, 97110-Therapeutic exercises, 97530- Therapeutic activity, 97112- Neuromuscular re-education, 97535- Self Care, 09811- Manual therapy, 97014- Electrical stimulation (unattended), 97016- Vasopneumatic device, Patient/Family education, Balance training, Stair training, Taping, Dry Needling, Joint mobilization, Spinal mobilization, Scar mobilization, Cryotherapy, and Moist heat  PLAN FOR NEXT SESSION:  Upgrade HEP- scapular (try quadruped vs prone)  Progress strength and minimize compensations.  Manual therapy, scapular retraction, posture : STRENGTHEN IN SUPINE AND RECLINED, PROGRESSING TO UPRIGHT.   Jannette Spanner, PTA 03/29/23 12:49  PM Phone: [phone] Fax: [phone]

## 2023-03-31 ENCOUNTER — Ambulatory Visit: Payer: 59 | Admitting: Physical Therapy

## 2023-03-31 ENCOUNTER — Encounter: Payer: Self-pay | Admitting: Physical Therapy

## 2023-03-31 DIAGNOSIS — M6281 Muscle weakness (generalized): Secondary | ICD-10-CM

## 2023-03-31 DIAGNOSIS — M25511 Pain in right shoulder: Secondary | ICD-10-CM

## 2023-03-31 DIAGNOSIS — M25611 Stiffness of right shoulder, not elsewhere classified: Secondary | ICD-10-CM

## 2023-03-31 NOTE — Therapy (Signed)
 OUTPATIENT PHYSICAL THERAPY SHOULDER TREATMENT  Patient Name: [name] [name] MRN: [id number] DOB:03-27-1975, 48 y.o., male Today's Date: 03/31/2023  END OF SESSION:  PT End of Session - 03/31/23 0934     Visit Number 33    Number of Visits 39    Date for PT Re-Evaluation 04/19/23    Authorization Type [employer]    Authorization Time Period 60VL no auth required    PT Start Time 0930    PT Stop Time 1015    PT Time Calculation (min) 45 min    Activity Tolerance Patient tolerated treatment well    Behavior During Therapy WFL for tasks assessed/performed                 Past Medical History:  Diagnosis Date   Anxiety    Diabetes mellitus without complication (HCC)    GERD (gastroesophageal reflux disease)    Hypertension    Obesity (BMI 30-39.9)    Plantar fasciitis of right foot    Skin lesion    Tendonitis    Traumatic tear of left rotator cuff 05/20/2016   Past Surgical History:  Procedure Laterality Date   SHOULDER ACROMIOPLASTY Right 11/23/2022   Procedure: RIGHT SHOULDER ACROMIOPLASTY;  Surgeon: Huel Cote, MD;  Location: [facility];  Service: Orthopedics;  Laterality: Right;   SHOULDER ARTHROSCOPY WITH ROTATOR CUFF REPAIR AND SUBACROMIAL DECOMPRESSION Left 05/21/2016   Procedure: SHOULDER ARTHROSCOPY WITH ROTATOR CUFF REPAIR AND SUBACROMIAL DECOMPRESSION LABRAL DEBRIDEMENT;  Surgeon: Salvatore Marvel, MD;  Location: [facility];  Service: Orthopedics;  Laterality: Left;   VASECTOMY     Patient Active Problem List   Diagnosis Date Noted   Traumatic complete tear of right rotator cuff 11/23/2022   Lab test positive for detection of COVID-19 virus 02/23/2022   Paresthesia 10/23/2018   Traumatic tear of left rotator cuff 05/20/2016   Family history of hypertrophic cardiomyopathy 26-Jul-2014   Family history of sudden cardiac death in son 26-Jul-2014    PCP: Farris Has, MD  REFERRING PROVIDER: Huel Cote, MD  REFERRING  DIAG: [id number] (ICD-10-CM) - Traumatic complete tear of right rotator cuff, initial encounter  THERAPY DIAG:  Stiffness of right shoulder, not elsewhere classified  Right shoulder pain, unspecified chronicity  Muscle weakness (generalized)  Rationale for Evaluation and Treatment: Rehabilitation  ONSET DATE: 11/23/22 rotator cuff repair, superior capsular reconstruction, acromioplasty  SUBJECTIVE:                                                                                                                                                                                      SUBJECTIVE STATEMENT: No pain today shoulder is doing great.  Has been working on it at home.   EVAL: In August pt was working on a ladder, fell backwards but unsure quite how he landed. States he did have a mild concussion but that has since resolved. Previously independent and active for work. Since surgery on 11/23/22, has been receiving assistance from wife and kids for daily tasks. Sleeping in recliner with sling and pillow under arm. Icing 3-4x/day ~47min.  Hand dominance: Right  PERTINENT HISTORY: BIL traumatic RC tears, anxiety, DM, GERD, HTN H/O Lt RCR 2018  Recent note : MD  01/05/23: 6-week status post right shoulder superior capsular reconstruction overall doing very well.  At this time I discussed I would like him to continue through the active range of motion protocol and to work on overhead active range of motion.  Overall he is doing quite well for this stage.  I will plan to see him back in 6 weeks for reassessment   PAIN:  Are you having pain: none/10 today  Location/description: occasional burning and popping; R shoulder, superiorly Best-worst over past week: 0-7/10  - aggravating factors: sleeping and mornings,  - Easing factors: sling, medication    PRECAUTIONS: s/p R RCR 11/23/22; using Dr. Steward Drone rotator cuff repair protocol per op note   WEIGHT BEARING RESTRICTIONS: Yes NWB surgical  limb  FALLS:  Has patient fallen in last 6 months? Yes. 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 Lifts and carries up to ~50#  PLOF: Independent  PATIENT GOALS: get back to playing golf, shooting pool  NEXT MD VISIT: Nov 18th , Feb 12 , may ?  OBJECTIVE:  Note: Objective measures were completed at Evaluation unless otherwise noted.  DIAGNOSTIC FINDINGS:  S/p shoulder surgery 11/23/22 Panel 1: Right RIGHT SHOULDER ARTHROSCOPY WITH SUPERIOR CAPSULAR RECONSTRUCTION / EXTENSIVE DEBRIDEMENT with Huel Cote, MD  Panel 1: Right RIGHT SHOULDER ACROMIOPLASTY with Huel Cote, MD   PATIENT SURVEYS:  FOTO 29>64 predicted Visit 8 , 12/23/22 : 56%  Visit 16: 01/27/23: NA Visit 23: 02/22/23: 58%:    COGNITION: Overall cognitive status: Within functional limits for tasks assessed     SENSATION/OBSERVATION: Surgical site obscured by bandaging Pt denies any sensory complaints since nerve block wore off  POSTURE: Sling RUE, increased R UT elevation  UPPER EXTREMITY ROM:  ROM Right Eval NT Right 12/01/22 RT 01/10/23 RT 01/25/23 RT  02/01/23 RT 02/17/23 Rt. 02/22/23 Rt.  03/10/23  Shoulder flexion  115  90 deg standing  90 with compensations 155 with compensation  153 155 heavy comp  Shoulder abduction  80    105 with compensation 100    Shoulder internal rotation       Thumb at T12 hand to lumbar  At 90 deg abd supine 80  Shoulder external rotation  18 FR to right top of head     Fingers to T1 At 90 deg abd supine 65  Elbow flexion          Elbow extension          Wrist flexion          Wrist extension           (Blank rows = not  tested) (Key: WFL = within  functional limits not formally assessed, * = concordant pain, s = stiffness/stretching sensation, NT = not tested)  Comments: deferred given acuity of surgery, phase 1 on eval, elbow flex/ext AAROM grossly WNL  UPPER EXTREMITY MMT:  MMT Right eval R 12/27/22  Rt. 01/27/23 Rt. 02/22/23 Rt 03/08/23  Shoulder flexion   3-/5 3/5 3/5  Shoulder extension       Shoulder abduction   3-/5 3/5 in available range 4/5  Shoulder extension       Shoulder internal rotation  5 5/5 5/5   Shoulder external rotation  4- 4/5 4/5   Elbow flexion       Elbow extension       Grip strength       (Blank rows = not tested)  (Key: WFL = within functional limits not formally assessed, * = concordant pain, s = stiffness/stretching sensation, NT = not tested)  Comments: deferred given acuity of surgery  SHOULDER SPECIAL TESTS: Deferred given acuity of surgery  JOINT MOBILITY TESTING:  Deferred given acuity of surgery    TREATMENT:      [facility] Adult PT Treatment:                                                DATE: 03/31/23  Therapeutic Activity: UBE 6 min L 3  Semi-reclined for dumbbell strength  Single arm 3 # 2 x 10 full ROM then 0-90 deg x 4 sets  Double arm 8 lbs flexion 0-90 deg x 10 then hover above knees  x 10 pulses Rt UE punch 3 lbs x 15 x 2 sets  Blue band ER x 15 x 2  Lat pull down blue band x 15  Red looped band semi circles each hand x 10 Serratus with foam roller, red band and without x 10 each  Foam roller (long) with Rt UE flexion/scaption 2 lbs x 10 x 2 sets Wall slides with lift off  Seated ER, abduction, punch (mid range) arm at 70 deg abduction, 2-3 lbs or each  Seated abduction x 10 no wgt arm extended, same with flexion (2 lbs) x 10  Standing red band bilateral horizontal abd just below shoulder hgt x 15 Standing red band extension 40 deg x 15 double    [facility] Adult PT Treatment:                                                DATE: 03/29/23 Therapeutic  Exercise: UBE Pulleys Tricep ext 20#  Ext 13# x 10, 17# x 10  Standing horiz abdct 10 x 2 GTB Standing lower trap wall slides with lift off x10  ER 3# side stepping and ER AROM x 10 each  IR 7# side stepping and IR EROM x 10 each  Single arm cable row 17# x 10, 20# x 15  Single arm protraction (seated chest press)  5# x 10, 10# x 10  Reclined 2# shoulder flexion 10 x 3 Reclined horiz abdct blue 10 x 2     [facility] Adult PT Treatment:  DATE: 03/24/23 Therapeutic Activity: UBE 6 min L3, 3 min each direction  Pendulum 5 lbs x 1 min Bent over horizontal abd Rt UE 4 lbs x 15  Bent over flexion Rt UE 4 lbs x 10  Triceps 7 plates x 15  Extension 7 plates x 15  ER 1 plate x 15  Shoulder flexion Rt UE palm up green Thera-band 2 x 10 Shoulder abduction green band x 2 x 10 Underhand lat pul down 35 lbs x 15  Seated row 25 lbs x 15, 35 lbs x 15 narrow pull  Seated chest press 35 lbs , scapula protraction/retraction  Sidelying ER, scaption and flexion ,3 lbs x 2 x 10 each  Supine AAROM flexion with dowel long hold RLE 5 lbs supine flexion x 15 long arm  Skull crusher 5 lbs x 15    [facility] Adult PT Treatment:                                                DATE: 03/22/23 Therapeutic Exercise: Supine shoulder flexion 4# 10 x 3  6# supine protraction 10 x 3  S/L 5# ER 10 x 3  S/L shoulder abdct 5# 10 x 3  Therapeutic Activity:  Nustep UE only L5  Reclined shoulder flexion AROM x 10,  1# 5 x 4  Qped weight shifting Lateral Qped weight shifting forward and back  Qped protract/retract  Cable Row single arm 13# 2 x 15 Cable shoulder ext 13# 10 x 2  Standing punch green Band x12 Standing scaption with mirror feedback , used AA and eccentric lowering    [facility] Adult PT Treatment:                                                DATE: 03/15/23 Therapeutic Exercise: UBE Reclined shoulder flexion AROM 10 x 3  Supine shoulder flexion  strengthening 4# 3 x  10  Supine 5# protraction 2 x 15 S/L Abdct  4# 10 x 3  S/L ER 4# 10 x 3  Cable Row single arm 10# x 15, 13# x 15  Cable shoulder extension 13# 3 x 10- thumb in/ thumb up   Therapeutic Activity: CKC high plank at counter  added weight shifting  Modalities: Ice pack    [facility] Adult PT Treatment:                                                DATE: 03/10/23 Therapeutic Activity: UBE level 3, 3 min Fw 3 min back   Wall slides flexion and out at a diagonal and across about 1 min each  Semi circle sweeps on the wall  Standing shoulder strength: flexion, scaption x 2 lb x 2 x 10  Closed chain high plank at countertop 30 sec x 3 added forearm plank for last rep  CKC high plank at high mat added shoulder tap  but too much  Bent over shoulder extension 4 lbs x 10 and then triceps x 10  GTB ER looped x 15  GTB ER looped x 15 with slight flexion  Bent knee reverse fly 3 lbs x 15  Bent over row 10 lbs x 15  Black TB wide high row and lat pull down x 10  Extension  x 10 black band  Added ER in goal post Green band  Hooklying with partial reclined (25 deg) overhead reaching  ER and IR green band at 90 deg     PATIENT EDUCATION: Education details: Pt education on PT impairments, prognosis, and POC. Informed consent. Rationale for interventions, safe/appropriate HEP performance Person educated: Patient Education method: Explanation, Demonstration, Tactile cues, Verbal cues, and Handouts Education comprehension: verbalized understanding, returned demonstration, verbal cues required, tactile cues required, and needs further education    HOME EXERCISE PROGRAM:  Access Code: [email or web address] URL: [email or web address] Date: 01/14/2023 Prepared by: Karie Mainland Access Code: [email or web address] URL: [email or web address] Date: 03/08/2023 Prepared by: Karie Mainland  Program Notes - with seated elbow flexion, work on gently straightening and bending the elbow with the support of the  other arm  Exercises - Standing Elbow Flexion with Resistance  - 1 x daily - 7 x weekly - 2 sets - 10 reps - 5 hold - Standing Shoulder External Rotation Stretch in Doorway  - 1 x daily - 7 x weekly - 2 sets - 10 reps - 10-20 hold - Standing Shoulder Abduction AAROM with Dowel  - 1 x daily - 7 x weekly - 2 sets - 10 reps - 10 hold - Standing Shoulder Extension with Dowel  - 1 x daily - 7 x weekly - 2 sets - 10 reps - 10 hold - Standing Shoulder Flexion AAROM with Dowel  - 1 x daily - 7 x weekly - 2 sets - 10 reps - 10 hold - Standing Shoulder Internal Rotation AAROM with Dowel  - 1 x daily - 7 x weekly - 2 sets - 10 reps - 10 hold - Corner Pec Major Stretch  - 1-2 x daily - 7 x weekly - 1 sets - 5 reps - 30 hold - Standing Lat Pull Down with Resistance - Elbows Bent  - 1 x daily - 7 x weekly - 2 sets - 10 reps - 5 hold - Sidelying Shoulder ER with Towel and Dumbbell  - 1 x daily - 7 x weekly - 2 sets - 10 reps - 2 hold - Sidelying Shoulder Horizontal Abduction  - 1 x daily - 7 x weekly - 2 sets - 10 reps - 2 hold - Sidelying Shoulder Scaption  - 1 x daily - 7 x weekly - 2 sets - 10 reps - 2 hold    ASSESSMENT:  CLINICAL IMPRESSION:    Session focused on strengthening right upper extremity in the mid-range of flexion and scaption/abduction.   Patient continued to show compensations with the right shoulder blade.  Right arm fatigues quickly but he recovers and continues to do well with progression to more upright positions. He will continue to benefit from skilled physical therapy to maximize functional outcome.   OBJECTIVE IMPAIRMENTS: decreased activity tolerance, decreased endurance, decreased mobility, decreased ROM, decreased strength, impaired perceived functional ability, impaired flexibility, impaired UE functional use, postural dysfunction, and pain.   ACTIVITY LIMITATIONS: carrying, lifting, bending, sleeping, transfers, bathing, toileting, dressing, self feeding, reach over head,  and hygiene/grooming  PARTICIPATION LIMITATIONS: meal prep, cleaning, laundry, driving, shopping, community activity, occupation, and yard work  PERSONAL FACTORS: Time since onset of injury/illness/exacerbation and 3+ comorbidities: anxiety, DM, HTN  are also affecting patient's functional outcome.   REHAB POTENTIAL: Good  CLINICAL DECISION MAKING: Stable/uncomplicated  EVALUATION COMPLEXITY: Low   GOALS: Goals reviewed with patient? No  SHORT TERM GOALS: Target date: 01/06/2023 Pt will demonstrate appropriate understanding and performance of initially prescribed HEP in order to facilitate improved independence with management of symptoms.  Baseline: HEP provided on eval Goal status:MET   2. Pt will score greater than or equal to 45 on FOTO in order to demonstrate improved perception of function due to symptoms.  Baseline: 29  Goal status: MET  LONG TERM GOALS: Target date: 02/17/2023   Pt will score 64 on FOTO in order to demonstrate improved perception of function due to symptoms. Baseline: 29, 56%  Goal status: ongoing   2.  Pt will demonstrate at least 150 degrees of active shoulder elevation in order to demonstrate improved tolerance to functional movement patterns such as overhead reaching. Baseline: can do this with heavy compensation  Goal status: MET   3.  Pt will demonstrate at least 4+/5 shoulder flex/abduction MMT for improved symmetry of UE strength and improved tolerance to functional movements.  Baseline: deferred on eval given acuity of surgery See above  Goal status: ongoing   4. Pt will report/demonstrate ability to perform upper body dressing with less than 2 point increase in pain on NPS in order to indicate improved tolerance/independence to ADLs.  Baseline: unable to perform, requiring family assist  Goal status:MET   5. Pt will be able to lift at least 40# from floor to waist with less than 2 pt increase in pain in order to facilitate improved  tolerance to work tasks.  Baseline: unable  Goal status: ongoing   6. Pt will be able to return to work without limitation of pain or functional (driving, pushing and pulling materials 40-50 lbs)   Baseline: able to drive without issues, not lifting on restriction  Goal status: ongoing   7. Pt will be able to use his Rt UE while driving, reaching with ease   Baseline: difficulty reaching back and to the radio  Goal status :ongoing   PLAN:  PT FREQUENCY: 2x/week  PT DURATION: 8 weeks  PLANNED INTERVENTIONS: 97164- PT Re-evaluation, 97110-Therapeutic exercises, 97530- Therapeutic activity, 97112- Neuromuscular re-education, 97535- Self Care, 84696- Manual therapy, 97014- Electrical stimulation (unattended), 97016- Vasopneumatic device, Patient/Family education, Balance training, Stair training, Taping, Dry Needling, Joint mobilization, Spinal mobilization, Scar mobilization, Cryotherapy, and Moist heat  PLAN FOR NEXT SESSION:  Upgrade HEP- scapular (try quadruped vs prone)  Progress strength and minimize compensations.  Manual therapy, scapular retraction, posture : STRENGTHEN IN SUPINE AND RECLINED, PROGRESSING TO UPRIGHT.   Karie Mainland, PT 03/31/23 9:36 AM Phone: [phone] Fax: [phone]

## 2023-04-04 NOTE — Therapy (Unsigned)
 OUTPATIENT PHYSICAL THERAPY SHOULDER TREATMENT  Patient Name: [name] [name] MRN: [id number] DOB:1975-05-23, 48 y.o., male Today's Date: 04/05/2023  END OF SESSION:  PT End of Session - 04/05/23 0820     Visit Number 34    Number of Visits 39    Date for PT Re-Evaluation 04/19/23    Authorization Type [employer]    Authorization Time Period 60VL no auth required    Progress Note Due on Visit 20    PT Start Time 0801    PT Stop Time 0845    PT Time Calculation (min) 44 min    Activity Tolerance Patient tolerated treatment well    Behavior During Therapy WFL for tasks assessed/performed                  Past Medical History:  Diagnosis Date   Anxiety    Diabetes mellitus without complication (HCC)    GERD (gastroesophageal reflux disease)    Hypertension    Obesity (BMI 30-39.9)    Plantar fasciitis of right foot    Skin lesion    Tendonitis    Traumatic tear of left rotator cuff 05/20/2016   Past Surgical History:  Procedure Laterality Date   SHOULDER ACROMIOPLASTY Right 11/23/2022   Procedure: RIGHT SHOULDER ACROMIOPLASTY;  Surgeon: Huel Cote, MD;  Location: [facility];  Service: Orthopedics;  Laterality: Right;   SHOULDER ARTHROSCOPY WITH ROTATOR CUFF REPAIR AND SUBACROMIAL DECOMPRESSION Left 05/21/2016   Procedure: SHOULDER ARTHROSCOPY WITH ROTATOR CUFF REPAIR AND SUBACROMIAL DECOMPRESSION LABRAL DEBRIDEMENT;  Surgeon: Salvatore Marvel, MD;  Location: [facility];  Service: Orthopedics;  Laterality: Left;   VASECTOMY     Patient Active Problem List   Diagnosis Date Noted   Traumatic complete tear of right rotator cuff 11/23/2022   Lab test positive for detection of COVID-19 virus 02/23/2022   Paresthesia 10/23/2018   Traumatic tear of left rotator cuff 05/20/2016   Family history of hypertrophic cardiomyopathy 2014/08/10   Family history of sudden cardiac death in son 10-Aug-2014    PCP: Farris Has, MD  REFERRING PROVIDER:  Huel Cote, MD  REFERRING DIAG: [phone] (ICD-10-CM) - Traumatic complete tear of right rotator cuff, initial encounter  THERAPY DIAG:  Stiffness of right shoulder, not elsewhere classified  Right shoulder pain, unspecified chronicity  Muscle weakness (generalized)  Rationale for Evaluation and Treatment: Rehabilitation  ONSET DATE: 11/23/22 rotator cuff repair, superior capsular reconstruction, acromioplasty  SUBJECTIVE:  SUBJECTIVE STATEMENT: Pt reports min soreness after playing 18 holes of golf this past weekend. He feels the scar tissue feeling tingly, irritated lately.    EVAL: In August pt was working on a ladder, fell backwards but unsure quite how he landed. States he did have a mild concussion but that has since resolved. Previously independent and active for work. Since surgery on 11/23/22, has been receiving assistance from wife and kids for daily tasks. Sleeping in recliner with sling and pillow under arm. Icing 3-4x/day ~52min.  Hand dominance: Right  PERTINENT HISTORY: BIL traumatic RC tears, anxiety, DM, GERD, HTN H/O Lt RCR 2018  Recent note : MD  01/05/23: 6-week status post right shoulder superior capsular reconstruction overall doing very well.  At this time I discussed I would like him to continue through the active range of motion protocol and to work on overhead active range of motion.  Overall he is doing quite well for this stage.  I will plan to see him back in 6 weeks for reassessment   PAIN:  Are you having pain: none/10 today  Location/description: occasional burning and popping; R shoulder, superiorly Best-worst over past week: 0-7/10  - aggravating factors: sleeping and mornings,  - Easing factors: sling, medication    PRECAUTIONS: s/p R RCR 11/23/22; using Dr. Steward Drone  rotator cuff repair protocol per op note   WEIGHT BEARING RESTRICTIONS: Yes NWB surgical limb  FALLS:  Has patient fallen in last 6 months? Yes. 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 Lifts and carries up to ~50#  PLOF: Independent  PATIENT GOALS: get back to playing golf, shooting pool  NEXT MD VISIT: Nov 18th , Feb 12 , may ?  OBJECTIVE:  Note: Objective measures were completed at Evaluation unless otherwise noted.  DIAGNOSTIC FINDINGS:  S/p shoulder surgery 11/23/22 Panel 1: Right RIGHT SHOULDER ARTHROSCOPY WITH SUPERIOR CAPSULAR RECONSTRUCTION / EXTENSIVE DEBRIDEMENT with Huel Cote, MD  Panel 1: Right RIGHT SHOULDER ACROMIOPLASTY with Huel Cote, MD   PATIENT SURVEYS:  FOTO 29>64 predicted Visit 8 , 12/23/22 : 56%  Visit 16: 01/27/23: NA Visit 23: 02/22/23: 58%:    COGNITION: Overall cognitive status: Within functional limits for tasks assessed     SENSATION/OBSERVATION: Surgical site obscured by bandaging Pt denies any sensory complaints since nerve block wore off  POSTURE: Sling RUE, increased R UT elevation  UPPER EXTREMITY ROM:  ROM Right Eval NT Right 12/01/22 RT 01/10/23 RT 01/25/23 RT  02/01/23 RT 02/17/23 Rt. 02/22/23 Rt.  03/10/23  Shoulder flexion  115  90 deg standing  90 with compensations 155 with compensation  153 155 heavy comp  Shoulder abduction  80    105 with compensation 100    Shoulder internal rotation       Thumb at T12 hand to lumbar  At 90 deg abd supine 80  Shoulder external rotation  18 FR to right top of head     Fingers to T1 At 90 deg abd supine 65  Elbow flexion          Elbow extension           Wrist flexion          Wrist extension           (  Blank rows = not tested) (Key: WFL = within functional limits not formally assessed, * = concordant pain, s = stiffness/stretching sensation, NT = not tested)  Comments: deferred given acuity of surgery, phase 1 on eval, elbow flex/ext AAROM grossly WNL  UPPER EXTREMITY MMT:  MMT Right eval R 12/27/22  Rt. 01/27/23 Rt. 02/22/23 Rt 03/08/23  Shoulder flexion   3-/5 3/5 3/5  Shoulder extension       Shoulder abduction   3-/5 3/5 in available range 4/5  Shoulder extension       Shoulder internal rotation  5 5/5 5/5   Shoulder external rotation  4- 4/5 4/5   Elbow flexion       Elbow extension       Grip strength       (Blank rows = not tested)  (Key: WFL = within functional limits not formally assessed, * = concordant pain, s = stiffness/stretching sensation, NT = not tested)  Comments: deferred given acuity of surgery  SHOULDER SPECIAL TESTS: Deferred given acuity of surgery  JOINT MOBILITY TESTING:  Deferred given acuity of surgery    TREATMENT:      [facility] Adult PT Treatment:                                                DATE: 04/05/23  Manual Therapy: IASTM along scar, deltoid (ant, lateral and post)   Therapeutic Activity: UBE level 5 , L3  PROM  Supine 5 lbs protraction/retraction Supine 3 lbs narrow press 2 x 10 Supine 3 lbs long lever flexion  Unilateral horizontal abduction 3 lbs x 2 x 10  Sidelying reverse fly 3 lbs x 15 TRX bicep curl, row, post delt. Fly, triceps 1 set x 12  Standing against wall flexion x 10 , (unable to do with 2 lbs )  Modalities: Cold pack 8 min   [facility] Adult PT Treatment:                                                DATE: 03/31/23  Therapeutic Activity: UBE 6 min L 3  Semi-reclined for dumbbell strength  Single arm 3 # 2 x 10 full ROM then 0-90 deg x 4 sets  Double arm 8 lbs flexion 0-90 deg x 10 then hover above knees  x 10 pulses Rt UE punch 3 lbs x 15 x 2 sets  Blue  band ER x 15 x 2  Lat pull down blue band x 15  Red looped band semi circles each hand x 10 Serratus with foam roller, red band and without x 10 each  Foam roller (long) with Rt UE flexion/scaption 2 lbs x 10 x 2 sets Wall slides with lift off  Seated ER, abduction, punch (mid range) arm at 70 deg abduction, 2-3 lbs or each  Seated abduction x 10 no wgt arm extended, same with flexion (2 lbs) x 10  Standing red band bilateral horizontal abd just below shoulder hgt x 15 Standing red band extension 40 deg x 15 double    [facility] Adult PT Treatment:  DATE: 03/29/23 Therapeutic Exercise: UBE Pulleys Tricep ext 20#  Ext 13# x 10, 17# x 10  Standing horiz abdct 10 x 2 GTB Standing lower trap wall slides with lift off x10  ER 3# side stepping and ER AROM x 10 each  IR 7# side stepping and IR EROM x 10 each  Single arm cable row 17# x 10, 20# x 15  Single arm protraction (seated chest press)  5# x 10, 10# x 10  Reclined 2# shoulder flexion 10 x 3 Reclined horiz abdct blue 10 x 2    PATIENT EDUCATION: Education details: Pt education on PT impairments, prognosis, and POC. Informed consent. Rationale for interventions, safe/appropriate HEP performance Person educated: Patient Education method: Explanation, Demonstration, Tactile cues, Verbal cues, and Handouts Education comprehension: verbalized understanding, returned demonstration, verbal cues required, tactile cues required, and needs further education    HOME EXERCISE PROGRAM:  Access Code: [email or web address] URL: [email or web address] Date: 01/14/2023 Prepared by: Karie Mainland Access Code: [email or web address] URL: [email or web address] Date: 03/08/2023 Prepared by: Karie Mainland  Program Notes - with seated elbow flexion, work on gently straightening and bending the elbow with the support of the other arm  Exercises - Standing Elbow Flexion with Resistance  - 1 x daily - 7 x  weekly - 2 sets - 10 reps - 5 hold - Standing Shoulder External Rotation Stretch in Doorway  - 1 x daily - 7 x weekly - 2 sets - 10 reps - 10-20 hold - Standing Shoulder Abduction AAROM with Dowel  - 1 x daily - 7 x weekly - 2 sets - 10 reps - 10 hold - Standing Shoulder Extension with Dowel  - 1 x daily - 7 x weekly - 2 sets - 10 reps - 10 hold - Standing Shoulder Flexion AAROM with Dowel  - 1 x daily - 7 x weekly - 2 sets - 10 reps - 10 hold - Standing Shoulder Internal Rotation AAROM with Dowel  - 1 x daily - 7 x weekly - 2 sets - 10 reps - 10 hold - Corner Pec Major Stretch  - 1-2 x daily - 7 x weekly - 1 sets - 5 reps - 30 hold - Standing Lat Pull Down with Resistance - Elbows Bent  - 1 x daily - 7 x weekly - 2 sets - 10 reps - 5 hold - Sidelying Shoulder ER with Towel and Dumbbell  - 1 x daily - 7 x weekly - 2 sets - 10 reps - 2 hold - Sidelying Shoulder Horizontal Abduction  - 1 x daily - 7 x weekly - 2 sets - 10 reps - 2 hold - Sidelying Shoulder Scaption  - 1 x daily - 7 x weekly - 2 sets - 10 reps - 2 hold    ASSESSMENT:  CLINICAL IMPRESSION:    Patient able to show increased shoulder flexion, improved range of motion against gravity.  Utilized body weight for core and upper body challenge with the TRX. He is showing improvement functionally and recreationally as well.  Pt will still benefit from skilled PT to further reduce compensations in shoulder hike with shoulder flexion and abduction.    OBJECTIVE IMPAIRMENTS: decreased activity tolerance, decreased endurance, decreased mobility, decreased ROM, decreased strength, impaired perceived functional ability, impaired flexibility, impaired UE functional use, postural dysfunction, and pain.   ACTIVITY LIMITATIONS: carrying, lifting, bending, sleeping, transfers, bathing, toileting, dressing, self feeding, reach over head, and hygiene/grooming  PARTICIPATION LIMITATIONS: meal prep, cleaning, laundry, driving, shopping, community  activity, occupation, and yard work  PERSONAL FACTORS: Time since onset of injury/illness/exacerbation and 3+ comorbidities: anxiety, DM, HTN  are also affecting patient's functional outcome.   REHAB POTENTIAL: Good  CLINICAL DECISION MAKING: Stable/uncomplicated  EVALUATION COMPLEXITY: Low   GOALS: Goals reviewed with patient? No  SHORT TERM GOALS: Target date: 01/06/2023 Pt will demonstrate appropriate understanding and performance of initially prescribed HEP in order to facilitate improved independence with management of symptoms.  Baseline: HEP provided on eval Goal status:MET   2. Pt will score greater than or equal to 45 on FOTO in order to demonstrate improved perception of function due to symptoms.  Baseline: 29  Goal status: MET  LONG TERM GOALS: Target date: 02/17/2023   Pt will score 64 on FOTO in order to demonstrate improved perception of function due to symptoms. Baseline: 29, 56%  Goal status: ongoing   2.  Pt will demonstrate at least 150 degrees of active shoulder elevation in order to demonstrate improved tolerance to functional movement patterns such as overhead reaching. Baseline: can do this with heavy compensation  Goal status: MET   3.  Pt will demonstrate at least 4+/5 shoulder flex/abduction MMT for improved symmetry of UE strength and improved tolerance to functional movements.  Baseline: deferred on eval given acuity of surgery See above  Goal status: ongoing   4. Pt will report/demonstrate ability to perform upper body dressing with less than 2 point increase in pain on NPS in order to indicate improved tolerance/independence to ADLs.  Baseline: unable to perform, requiring family assist  Goal status:MET   5. Pt will be able to lift at least 40# from floor to waist with less than 2 pt increase in pain in order to facilitate improved tolerance to work tasks.  Baseline: unable  Goal status: ongoing   6. Pt will be able to return to work without  limitation of pain or functional (driving, pushing and pulling materials 40-50 lbs)   Baseline: able to drive without issues, not lifting on restriction  Goal status: ongoing   7. Pt will be able to use his Rt UE while driving, reaching with ease   Baseline: difficulty reaching back and to the radio  Goal status :ongoing   PLAN:  PT FREQUENCY: 2x/week  PT DURATION: 8 weeks  PLANNED INTERVENTIONS: 97164- PT Re-evaluation, 97110-Therapeutic exercises, 97530- Therapeutic activity, 97112- Neuromuscular re-education, 97535- Self Care, 65784- Manual therapy, 97014- Electrical stimulation (unattended), 97016- Vasopneumatic device, Patient/Family education, Balance training, Stair training, Taping, Dry Needling, Joint mobilization, Spinal mobilization, Scar mobilization, Cryotherapy, and Moist heat  PLAN FOR NEXT SESSION:  Upgrade HEP- scapular (try quadruped vs prone)  Progress strength and minimize compensations.  Manual therapy, scapular retraction, posture : STRENGTHEN IN SUPINE AND RECLINED, PROGRESSING TO UPRIGHT.   Karie Mainland, PT 04/05/23 8:21 AM Phone: [phone] Fax: [phone]

## 2023-04-05 ENCOUNTER — Ambulatory Visit: Admitting: Physical Therapy

## 2023-04-05 DIAGNOSIS — M25511 Pain in right shoulder: Secondary | ICD-10-CM

## 2023-04-05 DIAGNOSIS — M25611 Stiffness of right shoulder, not elsewhere classified: Secondary | ICD-10-CM | POA: Diagnosis not present

## 2023-04-05 DIAGNOSIS — M6281 Muscle weakness (generalized): Secondary | ICD-10-CM

## 2023-04-07 ENCOUNTER — Encounter: Payer: Self-pay | Admitting: Physical Therapy

## 2023-04-07 ENCOUNTER — Ambulatory Visit: Admitting: Physical Therapy

## 2023-04-07 DIAGNOSIS — M6281 Muscle weakness (generalized): Secondary | ICD-10-CM

## 2023-04-07 DIAGNOSIS — M25611 Stiffness of right shoulder, not elsewhere classified: Secondary | ICD-10-CM | POA: Diagnosis not present

## 2023-04-07 DIAGNOSIS — M25511 Pain in right shoulder: Secondary | ICD-10-CM

## 2023-04-07 NOTE — Therapy (Signed)
 OUTPATIENT PHYSICAL THERAPY SHOULDER TREATMENT  Patient Name: [name] [name] MRN: [id number] DOB:11-14-1975, 48 y.o., male Today's Date: 04/07/2023  END OF SESSION:  PT End of Session - 04/07/23 1008     Visit Number 35    Number of Visits 39    Date for PT Re-Evaluation 04/19/23    Authorization Type [employer]    Authorization Time Period 60VL no auth required    Progress Note Due on Visit 20    PT Start Time 1015    PT Stop Time 1110    PT Time Calculation (min) 55 min                  Past Medical History:  Diagnosis Date   Anxiety    Diabetes mellitus without complication (HCC)    GERD (gastroesophageal reflux disease)    Hypertension    Obesity (BMI 30-39.9)    Plantar fasciitis of right foot    Skin lesion    Tendonitis    Traumatic tear of left rotator cuff 05/20/2016   Past Surgical History:  Procedure Laterality Date   SHOULDER ACROMIOPLASTY Right 11/23/2022   Procedure: RIGHT SHOULDER ACROMIOPLASTY;  Surgeon: Huel Cote, MD;  Location: [facility];  Service: Orthopedics;  Laterality: Right;   SHOULDER ARTHROSCOPY WITH ROTATOR CUFF REPAIR AND SUBACROMIAL DECOMPRESSION Left 05/21/2016   Procedure: SHOULDER ARTHROSCOPY WITH ROTATOR CUFF REPAIR AND SUBACROMIAL DECOMPRESSION LABRAL DEBRIDEMENT;  Surgeon: Salvatore Marvel, MD;  Location: [facility];  Service: Orthopedics;  Laterality: Left;   VASECTOMY     Patient Active Problem List   Diagnosis Date Noted   Traumatic complete tear of right rotator cuff 11/23/2022   Lab test positive for detection of COVID-19 virus 02/23/2022   Paresthesia 10/23/2018   Traumatic tear of left rotator cuff 05/20/2016   Family history of hypertrophic cardiomyopathy 08/21/2014   Family history of sudden cardiac death in son 21-Aug-2014    PCP: Farris Has, MD  REFERRING PROVIDER: Huel Cote, MD  REFERRING DIAG: [phone] (ICD-10-CM) - Traumatic complete tear of right rotator cuff, initial  encounter  THERAPY DIAG:  Stiffness of right shoulder, not elsewhere classified  Right shoulder pain, unspecified chronicity  Muscle weakness (generalized)  Rationale for Evaluation and Treatment: Rehabilitation  ONSET DATE: 11/23/22 rotator cuff repair, superior capsular reconstruction, acromioplasty  SUBJECTIVE:                                                                                                                                                                                      SUBJECTIVE STATEMENT: Scar massage helped with the tingling. It is really sore today from maybe over working it.  EVAL: In August pt was working on a ladder, fell backwards but unsure quite how he landed. States he did have a mild concussion but that has since resolved. Previously independent and active for work. Since surgery on 11/23/22, has been receiving assistance from wife and kids for daily tasks. Sleeping in recliner with sling and pillow under arm. Icing 3-4x/day ~24min.  Hand dominance: Right  PERTINENT HISTORY: BIL traumatic RC tears, anxiety, DM, GERD, HTN H/O Lt RCR 2018  Recent note : MD  01/05/23: 6-week status post right shoulder superior capsular reconstruction overall doing very well.  At this time I discussed I would like him to continue through the active range of motion protocol and to work on overhead active range of motion.  Overall he is doing quite well for this stage.  I will plan to see him back in 6 weeks for reassessment   PAIN:  Are you having pain: none/10 today  Location/description: occasional burning and popping; R shoulder, superiorly Best-worst over past week: 0-7/10  - aggravating factors: sleeping and mornings,  - Easing factors: sling, medication    PRECAUTIONS: s/p R RCR 11/23/22; using Dr. Steward Drone rotator cuff repair protocol per op note   WEIGHT BEARING RESTRICTIONS: Yes NWB surgical limb  FALLS:  Has patient fallen in last 6 months? Yes. 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 Lifts and carries up to ~50#  PLOF: Independent  PATIENT GOALS: get back to playing golf, shooting pool  NEXT MD VISIT: Nov 18th , Feb 12 , may ?  OBJECTIVE:  Note: Objective measures were completed at Evaluation unless otherwise noted.  DIAGNOSTIC FINDINGS:  S/p shoulder surgery 11/23/22 Panel 1: Right RIGHT SHOULDER ARTHROSCOPY WITH SUPERIOR CAPSULAR RECONSTRUCTION / EXTENSIVE DEBRIDEMENT with Huel Cote, MD  Panel 1: Right RIGHT SHOULDER ACROMIOPLASTY with Huel Cote, MD   PATIENT SURVEYS:  FOTO 29>64 predicted Visit 8 , 12/23/22 : 56%  Visit 16: 01/27/23: NA Visit 23: 02/22/23: 58%:    COGNITION: Overall cognitive status: Within functional limits for tasks assessed     SENSATION/OBSERVATION: Surgical site obscured by bandaging Pt denies any sensory complaints since nerve block wore off  POSTURE: Sling RUE, increased R UT elevation  UPPER EXTREMITY ROM:  ROM Right Eval NT Right 12/01/22 RT 01/10/23 RT 01/25/23 RT  02/01/23 RT 02/17/23 Rt. 02/22/23 Rt.  03/10/23  Shoulder flexion  115  90 deg standing  90 with compensations 155 with compensation  153 155 heavy comp  Shoulder abduction  80    105 with compensation 100    Shoulder internal rotation       Thumb at T12 hand to lumbar  At 90 deg abd supine 80  Shoulder external rotation  18 FR to right top of head     Fingers to T1 At 90 deg abd supine 65  Elbow flexion          Elbow extension          Wrist flexion          Wrist extension           (Blank rows = not tested) (Key: WFL = within functional limits not formally assessed, * =  concordant pain,  s = stiffness/stretching sensation, NT = not tested)  Comments: deferred given acuity of surgery, phase 1 on eval, elbow flex/ext AAROM grossly WNL  UPPER EXTREMITY MMT:  MMT Right eval R 12/27/22  Rt. 01/27/23 Rt. 02/22/23 Rt 03/08/23  Shoulder flexion   3-/5 3/5 3/5  Shoulder extension       Shoulder abduction   3-/5 3/5 in available range 4/5  Shoulder extension       Shoulder internal rotation  5 5/5 5/5   Shoulder external rotation  4- 4/5 4/5   Elbow flexion       Elbow extension       Grip strength       (Blank rows = not tested)  (Key: WFL = within functional limits not formally assessed, * = concordant pain, s = stiffness/stretching sensation, NT = not tested)  Comments: deferred given acuity of surgery  SHOULDER SPECIAL TESTS: Deferred given acuity of surgery  JOINT MOBILITY TESTING:  Deferred given acuity of surgery     Therapeutic Activity: UBE Level 3 x 5 min Pulleys  Doorway stretch  Sleeper stretch  Shoulder flexion stretch at door way  Right shoulder Row cable 20# 12 x 2  Standing attached shoulder horiz abdct GTB 12 x 2 Standing AAROM scaption with dowel then eccentric lowering  Standing AAROM shoulder flexion with dowel then eccentric lowering  Standing IR 10# side steppin g ER 3# side stepping  20# right pulldown  Standing OH PRESS with dowel 12 x 2  Wall push up x 8 Wall slide for abduction x 10 Prone scap retract + ext  Prone horiz abdct thumbs up Prone W  Modalities: Ice pack post session to decrease soreness     [facility] Adult PT Treatment:                                                DATE: 04/05/23  Manual Therapy: IASTM along scar, deltoid (ant, lateral and post)   Therapeutic Activity: UBE level 5 , L3  PROM  Supine 5 lbs protraction/retraction Supine 3 lbs narrow press 2 x 10 Supine 3 lbs long lever flexion  Unilateral horizontal abduction 3 lbs x 2 x 10  Sidelying reverse fly 3 lbs x 15 TRX bicep curl, row,  post delt. Fly, triceps 1 set x 12  Standing against wall flexion x 10 , (unable to do with 2 lbs )  Modalities: Cold pack 8 min   [facility] Adult PT Treatment:                                                DATE: 03/31/23  Therapeutic Activity: UBE 6 min L 3  Semi-reclined for dumbbell strength  Single arm 3 # 2 x 10 full ROM then 0-90 deg x 4 sets  Double arm 8 lbs flexion 0-90 deg x 10 then hover above knees  x 10 pulses Rt UE punch 3 lbs x 15 x 2 sets  Blue band ER x 15 x 2  Lat pull down blue band x 15  Red looped band semi circles each hand x 10 Serratus with foam roller, red band and without x 10 each  Foam roller (long) with Rt UE flexion/scaption  2 lbs x 10 x 2 sets Wall slides with lift off  Seated ER, abduction, punch (mid range) arm at 70 deg abduction, 2-3 lbs or each  Seated abduction x 10 no wgt arm extended, same with flexion (2 lbs) x 10  Standing red band bilateral horizontal abd just below shoulder hgt x 15 Standing red band extension 40 deg x 15 double    [facility] Adult PT Treatment:                                                DATE: 03/29/23 Therapeutic Exercise: UBE Pulleys Tricep ext 20#  Ext 13# x 10, 17# x 10  Standing horiz abdct 10 x 2 GTB Standing lower trap wall slides with lift off x10  ER 3# side stepping and ER AROM x 10 each  IR 7# side stepping and IR ROM x 10 each  Single arm cable row 17# x 10, 20# x 15  Single arm protraction (seated chest press)  5# x 10, 10# x 10  Reclined 2# shoulder flexion 10 x 3 Reclined horiz abdct blue 10 x 2    PATIENT EDUCATION: Education details: Pt education on PT impairments, prognosis, and POC. Informed consent. Rationale for interventions, safe/appropriate HEP performance Person educated: Patient Education method: Explanation, Demonstration, Tactile cues, Verbal cues, and Handouts Education comprehension: verbalized understanding, returned demonstration, verbal cues required, tactile cues required, and needs  further education    HOME EXERCISE PROGRAM:  Access Code: [email or web address] URL: [email or web address] Date: 01/14/2023 Prepared by: Karie Mainland Access Code: [email or web address] URL: [email or web address] Date: 03/08/2023 Prepared by: Karie Mainland  Program Notes - with seated elbow flexion, work on gently straightening and bending the elbow with the support of the other arm  Exercises - Standing Elbow Flexion with Resistance  - 1 x daily - 7 x weekly - 2 sets - 10 reps - 5 hold - Standing Shoulder External Rotation Stretch in Doorway  - 1 x daily - 7 x weekly - 2 sets - 10 reps - 10-20 hold - Standing Shoulder Abduction AAROM with Dowel  - 1 x daily - 7 x weekly - 2 sets - 10 reps - 10 hold - Standing Shoulder Extension with Dowel  - 1 x daily - 7 x weekly - 2 sets - 10 reps - 10 hold - Standing Shoulder Flexion AAROM with Dowel  - 1 x daily - 7 x weekly - 2 sets - 10 reps - 10 hold - Standing Shoulder Internal Rotation AAROM with Dowel  - 1 x daily - 7 x weekly - 2 sets - 10 reps - 10 hold - Corner Pec Major Stretch  - 1-2 x daily - 7 x weekly - 1 sets - 5 reps - 30 hold - Standing Lat Pull Down with Resistance - Elbows Bent  - 1 x daily - 7 x weekly - 2 sets - 10 reps - 5 hold - Sidelying Shoulder ER with Towel and Dumbbell  - 1 x daily - 7 x weekly - 2 sets - 10 reps - 2 hold - Sidelying Shoulder Horizontal Abduction  - 1 x daily - 7 x weekly - 2 sets - 10 reps - 2 hold - Sidelying Shoulder Scaption  - 1 x daily - 7 x weekly - 2 sets - 10 reps - 2 hold    ASSESSMENT:  CLINICAL IMPRESSION:   Session focused standing for more functional activity. Pt needs Assist to achieve OH ROM but can lower independently. Also worked in prone for more posterior chain support. Pt will still benefit from skilled PT to further reduce compensations in shoulder hike with shoulder flexion and abduction.    OBJECTIVE IMPAIRMENTS: decreased activity tolerance, decreased endurance, decreased  mobility, decreased ROM, decreased strength, impaired perceived functional ability, impaired flexibility, impaired UE functional use, postural dysfunction, and pain.   ACTIVITY LIMITATIONS: carrying, lifting, bending, sleeping, transfers, bathing, toileting, dressing, self feeding, reach over head, and hygiene/grooming  PARTICIPATION LIMITATIONS: meal prep, cleaning, laundry, driving, shopping, community activity, occupation, and yard work  PERSONAL FACTORS: Time since onset of injury/illness/exacerbation and 3+ comorbidities: anxiety, DM, HTN  are also affecting patient's functional outcome.   REHAB POTENTIAL: Good  CLINICAL DECISION MAKING: Stable/uncomplicated  EVALUATION COMPLEXITY: Low   GOALS: Goals reviewed with patient? No  SHORT TERM GOALS: Target date: 01/06/2023 Pt will demonstrate appropriate understanding and performance of initially prescribed HEP in order to facilitate improved independence with management of symptoms.  Baseline: HEP provided on eval Goal status:MET   2. Pt will score greater than or equal to 45 on FOTO in order to demonstrate improved perception of function due to symptoms.  Baseline: 29  Goal status: MET  LONG TERM GOALS: Target date: 02/17/2023   Pt will score 64 on FOTO in order to demonstrate improved perception of function due to symptoms. Baseline: 29, 56%  Goal status: ongoing   2.  Pt will demonstrate at least 150 degrees of active shoulder elevation in order to demonstrate improved tolerance to functional movement patterns such as overhead reaching. Baseline: can do this with heavy compensation  Goal status: MET   3.  Pt will demonstrate at least 4+/5 shoulder flex/abduction MMT for improved symmetry of UE strength and improved tolerance to functional movements.  Baseline: deferred on eval given acuity of surgery See above  Goal status: ongoing   4. Pt will report/demonstrate ability to perform upper body dressing with less than 2  point increase in pain on NPS in order to indicate improved tolerance/independence to ADLs.  Baseline: unable to perform, requiring family assist  Goal status:MET   5. Pt will be able to lift at least 40# from floor to waist with less than 2 pt increase in pain in order to facilitate improved tolerance to work tasks.  Baseline: unable  Goal status: ongoing   6. Pt will be able to return to work without limitation of pain or functional (driving, pushing and pulling materials 40-50 lbs)   Baseline: able to drive without issues, not lifting on restriction  Goal status: ongoing   7. Pt will be able to use his Rt UE while driving, reaching with ease   Baseline: difficulty reaching back and to the radio  Goal status :ongoing   PLAN:  PT FREQUENCY: 2x/week  PT DURATION: 8 weeks  PLANNED INTERVENTIONS: 97164- PT Re-evaluation, 97110-Therapeutic exercises, 97530- Therapeutic activity, 97112- Neuromuscular re-education, 97535- Self Care, 72536- Manual therapy, 97014- Electrical stimulation (unattended), 97016- Vasopneumatic device, Patient/Family education, Balance training, Stair training, Taping, Dry Needling, Joint mobilization, Spinal mobilization, Scar mobilization, Cryotherapy, and Moist heat  PLAN FOR NEXT SESSION:  Upgrade HEP- scapular (try quadruped vs prone)  Progress strength and minimize compensations.  Manual therapy, scapular retraction, posture : STRENGTHEN IN SUPINE AND RECLINED, PROGRESSING TO UPRIGHT.   Jannette Spanner, PTA 04/07/23 11:02 AM Phone: [phone] Fax: [phone]

## 2023-04-12 ENCOUNTER — Ambulatory Visit: Admitting: Physical Therapy

## 2023-04-12 ENCOUNTER — Encounter: Payer: Self-pay | Admitting: Physical Therapy

## 2023-04-12 DIAGNOSIS — M6281 Muscle weakness (generalized): Secondary | ICD-10-CM

## 2023-04-12 DIAGNOSIS — M25611 Stiffness of right shoulder, not elsewhere classified: Secondary | ICD-10-CM

## 2023-04-12 DIAGNOSIS — M25511 Pain in right shoulder: Secondary | ICD-10-CM

## 2023-04-12 NOTE — Therapy (Signed)
 OUTPATIENT PHYSICAL THERAPY SHOULDER TREATMENT  Patient Name: [name] [name] MRN: [id number] DOB:09/16/1975, 48 y.o., male Today's Date: 04/12/2023  END OF SESSION:  PT End of Session - 04/12/23 0721     Visit Number 36    Number of Visits 39    Date for PT Re-Evaluation 04/19/23    Authorization Type [employer]    Authorization Time Period 60VL no auth required    Progress Note Due on Visit 20    PT Start Time 0720    PT Stop Time 0810    PT Time Calculation (min) 50 min                  Past Medical History:  Diagnosis Date   Anxiety    Diabetes mellitus without complication (HCC)    GERD (gastroesophageal reflux disease)    Hypertension    Obesity (BMI 30-39.9)    Plantar fasciitis of right foot    Skin lesion    Tendonitis    Traumatic tear of left rotator cuff 05/20/2016   Past Surgical History:  Procedure Laterality Date   SHOULDER ACROMIOPLASTY Right 11/23/2022   Procedure: RIGHT SHOULDER ACROMIOPLASTY;  Surgeon: Huel Cote, MD;  Location: [facility];  Service: Orthopedics;  Laterality: Right;   SHOULDER ARTHROSCOPY WITH ROTATOR CUFF REPAIR AND SUBACROMIAL DECOMPRESSION Left 05/21/2016   Procedure: SHOULDER ARTHROSCOPY WITH ROTATOR CUFF REPAIR AND SUBACROMIAL DECOMPRESSION LABRAL DEBRIDEMENT;  Surgeon: Salvatore Marvel, MD;  Location: [facility];  Service: Orthopedics;  Laterality: Left;   VASECTOMY     Patient Active Problem List   Diagnosis Date Noted   Traumatic complete tear of right rotator cuff 11/23/2022   Lab test positive for detection of COVID-19 virus 02/23/2022   Paresthesia 10/23/2018   Traumatic tear of left rotator cuff 05/20/2016   Family history of hypertrophic cardiomyopathy 2014-08-09   Family history of sudden cardiac death in son August 09, 2014    PCP: Farris Has, MD  REFERRING PROVIDER: Huel Cote, MD  REFERRING DIAG: [phone] (ICD-10-CM) - Traumatic complete tear of right rotator cuff, initial  encounter  THERAPY DIAG:  Stiffness of right shoulder, not elsewhere classified  Muscle weakness (generalized)  Right shoulder pain, unspecified chronicity  Rationale for Evaluation and Treatment: Rehabilitation  ONSET DATE: 11/23/22 rotator cuff repair, superior capsular reconstruction, acromioplasty  SUBJECTIVE:                                                                                                                                                                                      SUBJECTIVE STATEMENT: Shoulder is hurting today in the front. 2-310. Been hurting for the last couple of days.  EVAL: In August pt was working on a ladder, fell backwards but unsure quite how he landed. States he did have a mild concussion but that has since resolved. Previously independent and active for work. Since surgery on 11/23/22, has been receiving assistance from wife and kids for daily tasks. Sleeping in recliner with sling and pillow under arm. Icing 3-4x/day ~39min.  Hand dominance: Right  PERTINENT HISTORY: BIL traumatic RC tears, anxiety, DM, GERD, HTN H/O Lt RCR 2018  Recent note : MD  01/05/23: 6-week status post right shoulder superior capsular reconstruction overall doing very well.  At this time I discussed I would like him to continue through the active range of motion protocol and to work on overhead active range of motion.  Overall he is doing quite well for this stage.  I will plan to see him back in 6 weeks for reassessment   PAIN:  Are you having pain: none/10 today  Location/description: occasional burning and popping; R shoulder, superiorly Best-worst over past week: 0-7/10  - aggravating factors: sleeping and mornings,  - Easing factors: sling, medication    PRECAUTIONS: s/p R RCR 11/23/22; using Dr. Steward Drone rotator cuff repair protocol per op note   WEIGHT BEARING RESTRICTIONS: Yes NWB surgical limb  FALLS:  Has patient fallen in last 6 months? Yes. 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 Lifts and carries up to ~50#  PLOF: Independent  PATIENT GOALS: get back to playing golf, shooting pool  NEXT MD VISIT: Nov 18th , Feb 12 , may ?  OBJECTIVE:  Note: Objective measures were completed at Evaluation unless otherwise noted.  DIAGNOSTIC FINDINGS:  S/p shoulder surgery 11/23/22 Panel 1: Right RIGHT SHOULDER ARTHROSCOPY WITH SUPERIOR CAPSULAR RECONSTRUCTION / EXTENSIVE DEBRIDEMENT with Huel Cote, MD  Panel 1: Right RIGHT SHOULDER ACROMIOPLASTY with Huel Cote, MD   PATIENT SURVEYS:  FOTO 29>64 predicted Visit 8 , 12/23/22 : 56%  Visit 16: 01/27/23: NA Visit 23: 02/22/23: 58%:    COGNITION: Overall cognitive status: Within functional limits for tasks assessed     SENSATION/OBSERVATION: Surgical site obscured by bandaging Pt denies any sensory complaints since nerve block wore off  POSTURE: Sling RUE, increased R UT elevation  UPPER EXTREMITY ROM:  ROM Right Eval NT Right 12/01/22 RT 01/10/23 RT 01/25/23 RT  02/01/23 RT 02/17/23 Rt. 02/22/23 Rt.  03/10/23 RT 04/12/23  Shoulder flexion  115  90 deg standing  90 with compensations 155 with compensation  153 155 heavy comp 155 Min comp  Shoulder abduction  80    105 with compensation 100     Shoulder internal rotation       Thumb at T12 hand to lumbar  At 90 deg abd supine 80   Shoulder external rotation  18 FR to right top of head     Fingers to T1 At 90 deg abd supine 65   Elbow flexion           Elbow extension           Wrist flexion           Wrist extension            (Blank rows = not tested) (Key: [facility] =  within functional limits  not formally assessed, * = concordant pain, s = stiffness/stretching sensation, NT = not tested)  Comments: deferred given acuity of surgery, phase 1 on eval, elbow flex/ext AAROM grossly WNL  UPPER EXTREMITY MMT:  MMT Right eval R 12/27/22  Rt. 01/27/23 Rt. 02/22/23 Rt 03/08/23  Shoulder flexion   3-/5 3/5 3/5  Shoulder extension       Shoulder abduction   3-/5 3/5 in available range 4/5  Shoulder extension       Shoulder internal rotation  5 5/5 5/5   Shoulder external rotation  4- 4/5 4/5   Elbow flexion       Elbow extension       Grip strength       (Blank rows = not tested)  (Key: WFL = within functional limits not formally assessed, * = concordant pain, s = stiffness/stretching sensation, NT = not tested) Comments: deferred given acuity of surgery  SHOULDER SPECIAL TESTS: Deferred given acuity of surgery  JOINT MOBILITY TESTING:  Deferred given acuity of surgery  04/12/23:  Therapeutic Activity: UBE Level 3 x 5 min Pulleys  Standing shoulder flexion AROM x 9 (failure)  Right shoulder Row cable 23# 15 x 2  17# right pulldown right 15 x 1, 13# 15 x 1 ER 3# side stepping  Standing horiz abdct x 10 GTB- reduced to reclined due to shoulder hike  Reclined: right PNF x 8  Reclined 2# shoulder flexion x 10 Side shoulder abdct 3# x 10  Side ER 3#  Prone scap retract + ext 10 x 2  Prone horiz abdct thumbs up Prone Right ER from 90 degrees 10 x 2  Modalities: Ice pack post session to decrease soreness   04/07/23:  Therapeutic Activity: UBE Level 3 x 5 min Pulleys  Doorway stretch  Sleeper stretch  Shoulder flexion stretch at door way  Right shoulder Row cable 20# 12 x 2  Standing attached shoulder horiz abdct GTB 12 x 2 Standing AAROM scaption with dowel then eccentric lowering  Standing AAROM shoulder flexion with dowel then eccentric lowering  Standing IR 10# side steppin g ER 3# side stepping  20# right pulldown  Standing OH PRESS with dowel 12 x 2  Wall push  up x 8 Wall slide for abduction x 10 Prone scap retract + ext  Prone horiz abdct thumbs up Prone W  Modalities: Ice pack post session to decrease soreness     [facility] Adult PT Treatment:                                                DATE: 04/05/23  Manual Therapy: IASTM along scar, deltoid (ant, lateral and post)   Therapeutic Activity: UBE level 5 , L3  PROM  Supine 5 lbs protraction/retraction Supine 3 lbs narrow press 2 x 10 Supine 3 lbs long lever flexion  Unilateral horizontal abduction 3 lbs x 2 x 10  Sidelying reverse fly 3 lbs x 15 TRX bicep curl, row, post delt. Fly, triceps 1 set x 12  Standing against wall flexion x 10 , (unable to do with 2 lbs )  Modalities: Cold pack 8 min   [facility] Adult PT Treatment:  DATE: 03/31/23  Therapeutic Activity: UBE 6 min L 3  Semi-reclined for dumbbell strength  Single arm 3 # 2 x 10 full ROM then 0-90 deg x 4 sets  Double arm 8 lbs flexion 0-90 deg x 10 then hover above knees  x 10 pulses Rt UE punch 3 lbs x 15 x 2 sets  Blue band ER x 15 x 2  Lat pull down blue band x 15  Red looped band semi circles each hand x 10 Serratus with foam roller, red band and without x 10 each  Foam roller (long) with Rt UE flexion/scaption 2 lbs x 10 x 2 sets Wall slides with lift off  Seated ER, abduction, punch (mid range) arm at 70 deg abduction, 2-3 lbs or each  Seated abduction x 10 no wgt arm extended, same with flexion (2 lbs) x 10  Standing red band bilateral horizontal abd just below shoulder hgt x 15 Standing red band extension 40 deg x 15 double      PATIENT EDUCATION: Education details: Pt education on PT impairments, prognosis, and POC. Informed consent. Rationale for interventions, safe/appropriate HEP performance Person educated: Patient Education method: Explanation, Demonstration, Tactile cues, Verbal cues, and Handouts Education comprehension: verbalized understanding, returned  demonstration, verbal cues required, tactile cues required, and needs further education    HOME EXERCISE PROGRAM:  Access Code: [email or web address] URL: [email or web address] Date: 01/14/2023 Prepared by: Karie Mainland Access Code: [email or web address] URL: [email or web address] Date: 03/08/2023 Prepared by: Karie Mainland  Program Notes - with seated elbow flexion, work on gently straightening and bending the elbow with the support of the other arm  Exercises - Standing Elbow Flexion with Resistance  - 1 x daily - 7 x weekly - 2 sets - 10 reps - 5 hold - Standing Shoulder External Rotation Stretch in Doorway  - 1 x daily - 7 x weekly - 2 sets - 10 reps - 10-20 hold - Standing Shoulder Abduction AAROM with Dowel  - 1 x daily - 7 x weekly - 2 sets - 10 reps - 10 hold - Standing Shoulder Extension with Dowel  - 1 x daily - 7 x weekly - 2 sets - 10 reps - 10 hold - Standing Shoulder Flexion AAROM with Dowel  - 1 x daily - 7 x weekly - 2 sets - 10 reps - 10 hold - Standing Shoulder Internal Rotation AAROM with Dowel  - 1 x daily - 7 x weekly - 2 sets - 10 reps - 10 hold - Corner Pec Major Stretch  - 1-2 x daily - 7 x weekly - 1 sets - 5 reps - 30 hold - Standing Lat Pull Down with Resistance - Elbows Bent  - 1 x daily - 7 x weekly - 2 sets - 10 reps - 5 hold - Sidelying Shoulder ER with Towel and Dumbbell  - 1 x daily - 7 x weekly - 2 sets - 10 reps - 2 hold - Sidelying Shoulder Horizontal Abduction  - 1 x daily - 7 x weekly - 2 sets - 10 reps - 2 hold - Sidelying Shoulder Scaption  - 1 x daily - 7 x weekly - 2 sets - 10 reps - 2 hold    ASSESSMENT:  CLINICAL IMPRESSION:   Pt reports increased anterior shoulder pain this morning. He has been working on eccentric lowering from [facility]. Today he is able to complete 9 standing shoulder flexion AROM before failure and demonstrated reduced shoulder hike compensations. Continued prone shoulder strengthening  for posterior chain support.  Recommended increased ice applications. Ionto not in POC.  Pt will still benefit from skilled PT to further reduce compensations in shoulder hike with shoulder flexion and abduction.    OBJECTIVE IMPAIRMENTS: decreased activity tolerance, decreased endurance, decreased mobility, decreased ROM, decreased strength, impaired perceived functional ability, impaired flexibility, impaired UE functional use, postural dysfunction, and pain.   ACTIVITY LIMITATIONS: carrying, lifting, bending, sleeping, transfers, bathing, toileting, dressing, self feeding, reach over head, and hygiene/grooming  PARTICIPATION LIMITATIONS: meal prep, cleaning, laundry, driving, shopping, community activity, occupation, and yard work  PERSONAL FACTORS: Time since onset of injury/illness/exacerbation and 3+ comorbidities: anxiety, DM, HTN  are also affecting patient's functional outcome.   REHAB POTENTIAL: Good  CLINICAL DECISION MAKING: Stable/uncomplicated  EVALUATION COMPLEXITY: Low   GOALS: Goals reviewed with patient? No  SHORT TERM GOALS: Target date: 01/06/2023 Pt will demonstrate appropriate understanding and performance of initially prescribed HEP in order to facilitate improved independence with management of symptoms.  Baseline: HEP provided on eval Goal status:MET   2. Pt will score greater than or equal to 45 on FOTO in order to demonstrate improved perception of function due to symptoms.  Baseline: 29  Goal status: MET  LONG TERM GOALS: Target date: 02/17/2023   Pt will score 64 on FOTO in order to demonstrate improved perception of function due to symptoms. Baseline: 29, 56%  Goal status: ongoing   2.  Pt will demonstrate at least 150 degrees of active shoulder elevation in order to demonstrate improved tolerance to functional movement patterns such as overhead reaching. Baseline: can do this with heavy compensation  Goal status: MET   3.  Pt will demonstrate at least 4+/5 shoulder  flex/abduction MMT for improved symmetry of UE strength and improved tolerance to functional movements.  Baseline: deferred on eval given acuity of surgery See above  Goal status: ongoing   4. Pt will report/demonstrate ability to perform upper body dressing with less than 2 point increase in pain on NPS in order to indicate improved tolerance/independence to ADLs.  Baseline: unable to perform, requiring family assist  Goal status:MET   5. Pt will be able to lift at least 40# from floor to waist with less than 2 pt increase in pain in order to facilitate improved tolerance to work tasks.  Baseline: unable  Goal status: ongoing   6. Pt will be able to return to work without limitation of pain or functional (driving, pushing and pulling materials 40-50 lbs)   Baseline: able to drive without issues, not lifting on restriction  Goal status: ongoing   7. Pt will be able to use his Rt UE while driving, reaching with ease   Baseline: difficulty reaching back and to the radio  Goal status :ongoing   PLAN:  PT FREQUENCY: 2x/week  PT DURATION: 8 weeks  PLANNED INTERVENTIONS: 97164- PT Re-evaluation, 97110-Therapeutic exercises, 97530- Therapeutic activity, 97112- Neuromuscular re-education, 97535- Self Care, 16109- Manual therapy, 97014- Electrical stimulation (unattended), 97016- Vasopneumatic device, Patient/Family education, Balance training, Stair training, Taping, Dry Needling, Joint mobilization, Spinal mobilization, Scar mobilization, Cryotherapy, and Moist heat  PLAN FOR NEXT SESSION:  Upgrade HEP- scapular (try quadruped vs prone)  Progress strength and minimize compensations.  Manual therapy, scapular retraction, posture : STRENGTHEN IN SUPINE AND RECLINED, PROGRESSING TO UPRIGHT.   Jannette Spanner, PTA 04/12/23 8:06 AM Phone: [phone] Fax: [phone]

## 2023-04-14 ENCOUNTER — Ambulatory Visit: Admitting: Physical Therapy

## 2023-04-14 ENCOUNTER — Encounter: Payer: Self-pay | Admitting: Physical Therapy

## 2023-04-14 DIAGNOSIS — M25511 Pain in right shoulder: Secondary | ICD-10-CM

## 2023-04-14 DIAGNOSIS — M6281 Muscle weakness (generalized): Secondary | ICD-10-CM

## 2023-04-14 DIAGNOSIS — M25611 Stiffness of right shoulder, not elsewhere classified: Secondary | ICD-10-CM | POA: Diagnosis not present

## 2023-04-14 NOTE — Therapy (Signed)
 OUTPATIENT PHYSICAL THERAPY SHOULDER TREATMENT  Patient Name: [name] [name] MRN: [id number] DOB:Jan 07, 1976, 48 y.o., male Today's Date: 04/14/2023  END OF SESSION:  PT End of Session - 04/14/23 0715     Visit Number 37    Number of Visits 39    Date for PT Re-Evaluation 04/19/23    Authorization Type [employer]    Authorization Time Period 60VL no auth required    PT Start Time 0715    PT Stop Time 0810    PT Time Calculation (min) 55 min                  Past Medical History:  Diagnosis Date   Anxiety    Diabetes mellitus without complication (HCC)    GERD (gastroesophageal reflux disease)    Hypertension    Obesity (BMI 30-39.9)    Plantar fasciitis of right foot    Skin lesion    Tendonitis    Traumatic tear of left rotator cuff 05/20/2016   Past Surgical History:  Procedure Laterality Date   SHOULDER ACROMIOPLASTY Right 11/23/2022   Procedure: RIGHT SHOULDER ACROMIOPLASTY;  Surgeon: Huel Cote, MD;  Location: [facility];  Service: Orthopedics;  Laterality: Right;   SHOULDER ARTHROSCOPY WITH ROTATOR CUFF REPAIR AND SUBACROMIAL DECOMPRESSION Left 05/21/2016   Procedure: SHOULDER ARTHROSCOPY WITH ROTATOR CUFF REPAIR AND SUBACROMIAL DECOMPRESSION LABRAL DEBRIDEMENT;  Surgeon: Salvatore Marvel, MD;  Location: [facility];  Service: Orthopedics;  Laterality: Left;   VASECTOMY     Patient Active Problem List   Diagnosis Date Noted   Traumatic complete tear of right rotator cuff 11/23/2022   Lab test positive for detection of COVID-19 virus 02/23/2022   Paresthesia 10/23/2018   Traumatic tear of left rotator cuff 05/20/2016   Family history of hypertrophic cardiomyopathy 08/04/14   Family history of sudden cardiac death in son 08/04/2014    PCP: Farris Has, MD  REFERRING PROVIDER: Huel Cote, MD  REFERRING DIAG: [id number] (ICD-10-CM) - Traumatic complete tear of right rotator cuff, initial encounter  THERAPY DIAG:   Stiffness of right shoulder, not elsewhere classified  Muscle weakness (generalized)  Right shoulder pain, unspecified chronicity  Rationale for Evaluation and Treatment: Rehabilitation  ONSET DATE: 11/23/22 rotator cuff repair, superior capsular reconstruction, acromioplasty  SUBJECTIVE:                                                                                                                                                                                      SUBJECTIVE STATEMENT: Shoulder is waking up in the front. Sometimes it feels like its jumping around.    EVAL: In August pt was working on a  ladder, fell backwards but unsure quite how he landed. States he did have a mild concussion but that has since resolved. Previously independent and active for work. Since surgery on 11/23/22, has been receiving assistance from wife and kids for daily tasks. Sleeping in recliner with sling and pillow under arm. Icing 3-4x/day ~48min.  Hand dominance: Right  PERTINENT HISTORY: BIL traumatic RC tears, anxiety, DM, GERD, HTN H/O Lt RCR 2018  Recent note : MD  01/05/23: 6-week status post right shoulder superior capsular reconstruction overall doing very well.  At this time I discussed I would like him to continue through the active range of motion protocol and to work on overhead active range of motion.  Overall he is doing quite well for this stage.  I will plan to see him back in 6 weeks for reassessment   PAIN:  Are you having pain: none/10 today  Location/description: occasional burning and popping; R shoulder, superiorly Best-worst over past week: 0-7/10  - aggravating factors: sleeping and mornings,  - Easing factors: sling, medication    PRECAUTIONS: s/p R RCR 11/23/22; using Dr. Steward Drone rotator cuff repair protocol per op note   WEIGHT BEARING RESTRICTIONS: Yes NWB surgical limb  FALLS:  Has patient fallen in last 6 months? Yes. 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 Lifts and carries up to ~50#  PLOF: Independent  PATIENT GOALS: get back to playing golf, shooting pool  NEXT MD VISIT: Nov 18th , Feb 12 , may ?  OBJECTIVE:  Note: Objective measures were completed at Evaluation unless otherwise noted.  DIAGNOSTIC FINDINGS:  S/p shoulder surgery 11/23/22 Panel 1: Right RIGHT SHOULDER ARTHROSCOPY WITH SUPERIOR CAPSULAR RECONSTRUCTION / EXTENSIVE DEBRIDEMENT with Huel Cote, MD  Panel 1: Right RIGHT SHOULDER ACROMIOPLASTY with Huel Cote, MD   PATIENT SURVEYS:  FOTO 29>64 predicted Visit 8 , 12/23/22 : 56%  Visit 16: 01/27/23: NA Visit 23: 02/22/23: 58%:    COGNITION: Overall cognitive status: Within functional limits for tasks assessed     SENSATION/OBSERVATION: Surgical site obscured by bandaging Pt denies any sensory complaints since nerve block wore off  POSTURE: Sling RUE, increased R UT elevation  UPPER EXTREMITY ROM:  ROM Right Eval NT Right 12/01/22 RT 01/10/23 RT 01/25/23 RT  02/01/23 RT 02/17/23 Rt. 02/22/23 Rt.  03/10/23 RT 04/12/23  Shoulder flexion  115  90 deg standing  90 with compensations 155 with compensation  153 155 heavy comp 155 Min comp  Shoulder abduction  80    105 with compensation 100     Shoulder internal rotation       Thumb at T12 hand to lumbar  At 90 deg abd supine 80   Shoulder external rotation  18 FR to right top of head     Fingers to T1 At 90 deg abd supine 65   Elbow flexion           Elbow extension           Wrist flexion           Wrist extension            (Blank rows = not tested) (Key: WFL = within functional limits not formally assessed, * =  concordant  pain, s = stiffness/stretching sensation, NT = not tested)  Comments: deferred given acuity of surgery, phase 1 on eval, elbow flex/ext AAROM grossly WNL  UPPER EXTREMITY MMT:  MMT Right eval R 12/27/22  Rt. 01/27/23 Rt. 02/22/23 Rt 03/08/23  Shoulder flexion   3-/5 3/5 3/5  Shoulder extension       Shoulder abduction   3-/5 3/5 in available range 4/5  Shoulder extension       Shoulder internal rotation  5 5/5 5/5   Shoulder external rotation  4- 4/5 4/5   Elbow flexion       Elbow extension       Grip strength       (Blank rows = not tested)  (Key: WFL = within functional limits not formally assessed, * = concordant pain, s = stiffness/stretching sensation, NT = not tested) Comments: deferred given acuity of surgery  SHOULDER SPECIAL TESTS: Deferred given acuity of surgery  JOINT MOBILITY TESTING:  Deferred given acuity of surgery  04/14/23:  Therapeutic Activity: UBE Level 3 x 5 min Pulleys  Standing shoulder flexion AROM x 10 (failure)  Right shouler abduction x 2 (failure) Standing cane AA shoulder abduction with eccentric lowering x 10  Attempted belt to depress shoulder with AROM Right shoulder Row cable 23# 15 x 2  13# right pulldown right 20 x 1 ER Blue 10 x 2  Standing horiz abdct Blue band attached  Reclined: right PNF x 15 Reclined 2# shoulder flexion x 10, 3# x 10 Side shoulder abdct 4# 2 x 10  Side ER 3# 10 x2 Prone scap retract + ext 10 x 2  Prone horiz abdct thumbs up Prone Right ER from 90 degrees 10 x 2 2# thumb in and thumb up Modalities: Ice pack post session to decrease soreness   04/12/23:  Therapeutic Activity: UBE Level 3 x 5 min Pulleys  Standing shoulder flexion AROM x 9 (failure)  Right shoulder Row cable 23# 15 x 2  17# right pulldown right 15 x 1, 13# 15 x 1 ER 3# side stepping  Standing horiz abdct x 10 GTB- reduced to reclined due to shoulder hike  Reclined: right PNF x 8  Reclined 2# shoulder flexion x 10 Side shoulder abdct 3# x  10  Side ER 3#  Prone scap retract + ext 10 x 2  Prone horiz abdct thumbs up Prone Right ER from 90 degrees 10 x 2  Modalities: Ice pack post session to decrease soreness   04/07/23:  Therapeutic Activity: UBE Level 3 x 5 min Pulleys  Doorway stretch  Sleeper stretch  Shoulder flexion stretch at door way  Right shoulder Row cable 20# 12 x 2  Standing attached shoulder horiz abdct GTB 12 x 2 Standing AAROM scaption with dowel then eccentric lowering  Standing AAROM shoulder flexion with dowel then eccentric lowering  Standing IR 10# side steppin g ER 3# side stepping  20# right pulldown  Standing OH PRESS with dowel 12 x 2  Wall push up x 8 Wall slide for abduction x 10 Prone scap retract + ext  Prone horiz abdct thumbs up Prone W  Modalities: Ice pack post session to decrease soreness     [facility] Adult PT Treatment:  DATE: 04/05/23  Manual Therapy: IASTM along scar, deltoid (ant, lateral and post)   Therapeutic Activity: UBE level 5 , L3  PROM  Supine 5 lbs protraction/retraction Supine 3 lbs narrow press 2 x 10 Supine 3 lbs long lever flexion  Unilateral horizontal abduction 3 lbs x 2 x 10  Sidelying reverse fly 3 lbs x 15 TRX bicep curl, row, post delt. Fly, triceps 1 set x 12  Standing against wall flexion x 10 , (unable to do with 2 lbs )  Modalities: Cold pack 8 min   [facility] Adult PT Treatment:                                                DATE: 03/31/23  Therapeutic Activity: UBE 6 min L 3  Semi-reclined for dumbbell strength  Single arm 3 # 2 x 10 full ROM then 0-90 deg x 4 sets  Double arm 8 lbs flexion 0-90 deg x 10 then hover above knees  x 10 pulses Rt UE punch 3 lbs x 15 x 2 sets  Blue band ER x 15 x 2  Lat pull down blue band x 15  Red looped band semi circles each hand x 10 Serratus with foam roller, red band and without x 10 each  Foam roller (long) with Rt UE flexion/scaption 2 lbs x 10 x 2  sets Wall slides with lift off  Seated ER, abduction, punch (mid range) arm at 70 deg abduction, 2-3 lbs or each  Seated abduction x 10 no wgt arm extended, same with flexion (2 lbs) x 10  Standing red band bilateral horizontal abd just below shoulder hgt x 15 Standing red band extension 40 deg x 15 double      PATIENT EDUCATION: Education details: Pt education on PT impairments, prognosis, and POC. Informed consent. Rationale for interventions, safe/appropriate HEP performance Person educated: Patient Education method: Explanation, Demonstration, Tactile cues, Verbal cues, and Handouts Education comprehension: verbalized understanding, returned demonstration, verbal cues required, tactile cues required, and needs further education    HOME EXERCISE PROGRAM:  Access Code: [email or web address] URL: [email or web address] Date: 01/14/2023 Prepared by: Karie Mainland Access Code: [email or web address] URL: [email or web address] Date: 03/08/2023 Prepared by: Karie Mainland  Program Notes - with seated elbow flexion, work on gently straightening and bending the elbow with the support of the other arm  Exercises - Standing Elbow Flexion with Resistance  - 1 x daily - 7 x weekly - 2 sets - 10 reps - 5 hold - Standing Shoulder External Rotation Stretch in Doorway  - 1 x daily - 7 x weekly - 2 sets - 10 reps - 10-20 hold - Standing Shoulder Abduction AAROM with Dowel  - 1 x daily - 7 x weekly - 2 sets - 10 reps - 10 hold - Standing Shoulder Extension with Dowel  - 1 x daily - 7 x weekly - 2 sets - 10 reps - 10 hold - Standing Shoulder Flexion AAROM with Dowel  - 1 x daily - 7 x weekly - 2 sets - 10 reps - 10 hold - Standing Shoulder Internal Rotation AAROM with Dowel  - 1 x daily - 7 x weekly - 2 sets - 10 reps - 10 hold - Corner Pec Major Stretch  - 1-2 x daily - 7 x weekly - 1 sets - 5 reps - 30 hold -  Standing Lat Pull Down with Resistance - Elbows Bent  - 1 x daily - 7 x weekly - 2 sets  - 10 reps - 5 hold - Sidelying Shoulder ER with Towel and Dumbbell  - 1 x daily - 7 x weekly - 2 sets - 10 reps - 2 hold - Sidelying Shoulder Horizontal Abduction  - 1 x daily - 7 x weekly - 2 sets - 10 reps - 2 hold - Sidelying Shoulder Scaption  - 1 x daily - 7 x weekly - 2 sets - 10 reps - 2 hold    ASSESSMENT:  CLINICAL IMPRESSION:   He has been working on eccentric lowering from [facility] for flexion and is able to complete 10 standing shoulder flexion AROM before failure and demonstrated reduced shoulder hike compensations. He was able to complete 2 standing shoulder abduction AROMs before failure and was encouraged work on assisted raise with eccentric lowering.  Continued prone shoulder strengthening for posterior chain support as well as reclined OH strengthening with reduced compensations.  Pt will still benefit from skilled PT to further reduce compensations in shoulder hike with shoulder flexion and abduction.    OBJECTIVE IMPAIRMENTS: decreased activity tolerance, decreased endurance, decreased mobility, decreased ROM, decreased strength, impaired perceived functional ability, impaired flexibility, impaired UE functional use, postural dysfunction, and pain.   ACTIVITY LIMITATIONS: carrying, lifting, bending, sleeping, transfers, bathing, toileting, dressing, self feeding, reach over head, and hygiene/grooming  PARTICIPATION LIMITATIONS: meal prep, cleaning, laundry, driving, shopping, community activity, occupation, and yard work  PERSONAL FACTORS: Time since onset of injury/illness/exacerbation and 3+ comorbidities: anxiety, DM, HTN  are also affecting patient's functional outcome.   REHAB POTENTIAL: Good  CLINICAL DECISION MAKING: Stable/uncomplicated  EVALUATION COMPLEXITY: Low   GOALS: Goals reviewed with patient? No  SHORT TERM GOALS: Target date: 01/06/2023 Pt will demonstrate appropriate understanding and performance of initially prescribed HEP in order to facilitate  improved independence with management of symptoms.  Baseline: HEP provided on eval Goal status:MET   2. Pt will score greater than or equal to 45 on FOTO in order to demonstrate improved perception of function due to symptoms.  Baseline: 29  Goal status: MET  LONG TERM GOALS: Target date: 02/17/2023   Pt will score 64 on FOTO in order to demonstrate improved perception of function due to symptoms. Baseline: 29, 56%  Goal status: ongoing   2.  Pt will demonstrate at least 150 degrees of active shoulder elevation in order to demonstrate improved tolerance to functional movement patterns such as overhead reaching. Baseline: can do this with heavy compensation  Goal status: MET   3.  Pt will demonstrate at least 4+/5 shoulder flex/abduction MMT for improved symmetry of UE strength and improved tolerance to functional movements.  Baseline: deferred on eval given acuity of surgery See above  Goal status: ongoing   4. Pt will report/demonstrate ability to perform upper body dressing with less than 2 point increase in pain on NPS in order to indicate improved tolerance/independence to ADLs.  Baseline: unable to perform, requiring family assist  Goal status:MET   5. Pt will be able to lift at least 40# from floor to waist with less than 2 pt increase in pain in order to facilitate improved tolerance to work tasks.  Baseline: unable  Goal status: ongoing   6. Pt will be able to return to work without limitation of pain or functional (driving, pushing and pulling materials 40-50 lbs)   Baseline: able to drive without issues, not  lifting on restriction  Goal status: ongoing   7. Pt will be able to use his Rt UE while driving, reaching with ease   Baseline: difficulty reaching back and to the radio  Goal status :ongoing   PLAN:  PT FREQUENCY: 2x/week  PT DURATION: 8 weeks  PLANNED INTERVENTIONS: 97164- PT Re-evaluation, 97110-Therapeutic exercises, 97530- Therapeutic activity, 97112-  Neuromuscular re-education, 97535- Self Care, 45409- Manual therapy, 97014- Electrical stimulation (unattended), 97016- Vasopneumatic device, Patient/Family education, Balance training, Stair training, Taping, Dry Needling, Joint mobilization, Spinal mobilization, Scar mobilization, Cryotherapy, and Moist heat  PLAN FOR NEXT SESSION:  Upgrade HEP- scapular (try quadruped vs prone)  Progress strength and minimize compensations.  Manual therapy, scapular retraction, posture : STRENGTHEN IN SUPINE AND RECLINED, PROGRESSING TO UPRIGHT.   Jannette Spanner, PTA 04/14/23 8:38 AM Phone: [phone] Fax: [phone]

## 2023-04-18 NOTE — Therapy (Unsigned)
 OUTPATIENT PHYSICAL THERAPY SHOULDER TREATMENT/RENEWAL  Patient Name: [name] [name] MRN: [id number] DOB:1975/10/04, 48 y.o., male Today's Date: 04/19/2023  END OF SESSION:  PT End of Session - 04/19/23 0854     Visit Number 38    Number of Visits 46    Date for PT Re-Evaluation 06/14/23    Authorization Type [employer]    Authorization Time Period 60VL no auth required    PT Start Time 0850    PT Stop Time 0930    PT Time Calculation (min) 40 min    Activity Tolerance Patient tolerated treatment well    Behavior During Therapy WFL for tasks assessed/performed                   Past Medical History:  Diagnosis Date   Anxiety    Diabetes mellitus without complication (HCC)    GERD (gastroesophageal reflux disease)    Hypertension    Obesity (BMI 30-39.9)    Plantar fasciitis of right foot    Skin lesion    Tendonitis    Traumatic tear of left rotator cuff 05/20/2016   Past Surgical History:  Procedure Laterality Date   SHOULDER ACROMIOPLASTY Right 11/23/2022   Procedure: RIGHT SHOULDER ACROMIOPLASTY;  Surgeon: Huel Cote, MD;  Location: [facility];  Service: Orthopedics;  Laterality: Right;   SHOULDER ARTHROSCOPY WITH ROTATOR CUFF REPAIR AND SUBACROMIAL DECOMPRESSION Left 05/21/2016   Procedure: SHOULDER ARTHROSCOPY WITH ROTATOR CUFF REPAIR AND SUBACROMIAL DECOMPRESSION LABRAL DEBRIDEMENT;  Surgeon: Salvatore Marvel, MD;  Location: [facility];  Service: Orthopedics;  Laterality: Left;   VASECTOMY     Patient Active Problem List   Diagnosis Date Noted   Traumatic complete tear of right rotator cuff 11/23/2022   Lab test positive for detection of COVID-19 virus 02/23/2022   Paresthesia 10/23/2018   Traumatic tear of left rotator cuff 05/20/2016   Family history of hypertrophic cardiomyopathy 2014/08/13   Family history of sudden cardiac death in son 08-13-14    PCP: Farris Has, MD  REFERRING PROVIDER: Huel Cote,  MD  REFERRING DIAG: [phone] (ICD-10-CM) - Traumatic complete tear of right rotator cuff, initial encounter  THERAPY DIAG:  Stiffness of right shoulder, not elsewhere classified  Right shoulder pain, unspecified chronicity  Muscle weakness (generalized)  Rationale for Evaluation and Treatment: Rehabilitation  ONSET DATE: 11/23/22 rotator cuff repair, superior capsular reconstruction, acromioplasty  SUBJECTIVE:                                                                                                                                                                                      SUBJECTIVE STATEMENT: Scar feels a little sensitive.  Otherwise, I'm good   EVAL: In August pt was working on a ladder, fell backwards but unsure quite how he landed. States he did have a mild concussion but that has since resolved. Previously independent and active for work. Since surgery on 11/23/22, has been receiving assistance from wife and kids for daily tasks. Sleeping in recliner with sling and pillow under arm. Icing 3-4x/day ~75min.  Hand dominance: Right  PERTINENT HISTORY: BIL traumatic RC tears, anxiety, DM, GERD, HTN H/O Lt RCR 2018  Recent note : MD  01/05/23: 6-week status post right shoulder superior capsular reconstruction overall doing very well.  At this time I discussed I would like him to continue through the active range of motion protocol and to work on overhead active range of motion.  Overall he is doing quite well for this stage.  I will plan to see him back in 6 weeks for reassessment   PAIN:  Are you having pain: none/10 today  Location/description: occasional burning and popping; R shoulder, superiorly Best-worst over past week: 0-7/10  - aggravating factors: sleeping and mornings,  - Easing factors: sling, medication    PRECAUTIONS: s/p R RCR 11/23/22; using Dr. Steward Drone rotator cuff repair protocol per op note   WEIGHT BEARING RESTRICTIONS: Yes NWB surgical  limb  FALLS:  Has patient fallen in last 6 months? Yes. 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 Lifts and carries up to ~50#  PLOF: Independent  PATIENT GOALS: get back to playing golf, shooting pool  NEXT MD VISIT: Nov 18th , Feb 12 , may ?  OBJECTIVE:  Note: Objective measures were completed at Evaluation unless otherwise noted.  DIAGNOSTIC FINDINGS:  S/p shoulder surgery 11/23/22 Panel 1: Right RIGHT SHOULDER ARTHROSCOPY WITH SUPERIOR CAPSULAR RECONSTRUCTION / EXTENSIVE DEBRIDEMENT with Huel Cote, MD  Panel 1: Right RIGHT SHOULDER ACROMIOPLASTY with Huel Cote, MD   PATIENT SURVEYS:  FOTO 29>64 predicted Visit 8 , 12/23/22 : 56%  Visit 16: 01/27/23: NA Visit 23: 02/22/23: 58%:  Visit 38% 04/19/23: 75% DC   COGNITION: Overall cognitive status: Within functional limits for tasks assessed     SENSATION/OBSERVATION: Surgical site obscured by bandaging Pt denies any sensory complaints since nerve block wore off  POSTURE: Sling RUE, increased R UT elevation  UPPER EXTREMITY ROM:  ROM Rt. 02/22/23 Rt.  03/10/23 RT 04/12/23 Rt 04/19/23  Shoulder flexion  153 155 heavy comp 155 Min comp 164  Shoulder abduction 100    165  Shoulder internal rotation Thumb at T12 hand to lumbar  At 90 deg abd supine 80  T8 functional reach  Shoulder external rotation Fingers to T1 At 90 deg abd supine 65  T1 Functional reach   Elbow flexion      Elbow extension      Wrist flexion      Wrist extension       (Blank rows = not tested) (Key: WFL = within functional limits not formally assessed, * = concordant pain, s = stiffness/stretching sensation, NT  = not tested)  Comments: deferred given acuity of surgery, phase 1 on eval, elbow flex/ext AAROM grossly WNL  UPPER EXTREMITY MMT:  MMT Right eval R 12/27/22  Rt. 01/27/23 Rt. 02/22/23 Rt  03/08/23 Rt.  04/19/23  Shoulder flexion   3-/5 3/5 3/5 3+/5  Shoulder extension        Shoulder abduction   3-/5 3/5 in available range 4/5 4-/5  Shoulder extension        Shoulder internal rotation  5 5/5 5/5  5/5  Shoulder external rotation  4- 4/5 4/5  4/5 4-/5 in 90 deg abd  Elbow flexion        Elbow extension        Grip strength        (Blank rows = not tested)  (Key: WFL = within functional limits not formally assessed, * = concordant pain, s = stiffness/stretching sensation, NT = not tested) Comments: deferred given acuity of surgery  SHOULDER SPECIAL TESTS: Deferred given acuity of surgery  JOINT MOBILITY TESTING:  Deferred given acuity of surgery   [facility] Adult PT Treatment:                                                DATE: 04/19/23 Therapeutic Exercise: NuStep UE only level 8, 6 min  Therapeutic Activity: MMT, ROM measurement  Row 4 plates x 20  ER 1 plate x 15 Flexion 3 x 10, 1 plate  30 sec x 3 Rt UE 5 lbs overhead hold static standing  Straight arm pull down slastix  Lower trap activation using wall and foam roller  Shoulder flexion AROM Rt UE at wall unable to get full ROM due to fatigue   04/14/23:  Therapeutic Activity: UBE Level 3 x 5 min Pulleys  Standing shoulder flexion AROM x 10 (failure)  Right shouler abduction x 2 (failure) Standing cane AA shoulder abduction with eccentric lowering x 10  Attempted belt to depress shoulder with AROM Right shoulder Row cable 23# 15 x 2  13# right pulldown right 20 x 1 ER Blue 10 x 2  Standing horiz abdct Blue band attached  Reclined: right PNF x 15 Reclined 2# shoulder flexion x 10, 3# x 10 Side shoulder abdct 4# 2 x 10  Side ER 3# 10 x2 Prone scap retract + ext 10 x 2  Prone horiz abdct thumbs up Prone Right ER from  90 degrees 10 x 2 2# thumb in and thumb up Modalities: Ice pack post session to decrease soreness   04/12/23:  Therapeutic Activity: UBE Level 3 x 5 min Pulleys  Standing shoulder flexion AROM x 9 (failure)  Right shoulder Row cable 23# 15 x 2  17# right pulldown right 15 x 1, 13# 15 x 1 ER 3# side stepping  Standing horiz abdct x 10 GTB- reduced to reclined due to shoulder hike  Reclined: right PNF x 8  Reclined 2# shoulder flexion x 10 Side shoulder abdct 3# x 10  Side ER 3#  Prone scap retract + ext 10 x 2  Prone horiz abdct thumbs up Prone Right ER from 90 degrees 10 x 2  Modalities: Ice pack post session to decrease soreness   04/07/23:  Therapeutic Activity: UBE Level 3 x 5 min Pulleys  Doorway stretch  Sleeper stretch  Shoulder flexion stretch at door way  Right shoulder Row cable 20# 12 x 2  Standing attached shoulder horiz abdct GTB 12 x 2 Standing AAROM scaption with dowel then eccentric lowering  Standing AAROM shoulder flexion with dowel then eccentric lowering  Standing IR 10# side steppin g ER 3# side stepping  20# right pulldown  Standing OH PRESS with dowel 12 x 2  Wall push up x 8 Wall slide for abduction x 10 Prone scap retract + ext  Prone horiz abdct thumbs up Prone W  Modalities: Ice pack post session to decrease soreness     [facility] Adult PT Treatment:                                                DATE: 04/05/23  Manual Therapy: IASTM along scar, deltoid (ant, lateral and post)   Therapeutic Activity: UBE level 5 , L3  PROM  Supine 5 lbs protraction/retraction Supine 3 lbs narrow press 2 x 10 Supine 3 lbs long lever flexion  Unilateral horizontal abduction 3 lbs x 2 x 10  Sidelying reverse fly 3 lbs x 15 TRX bicep curl, row, post delt. Fly, triceps 1 set x 12  Standing against wall flexion x 10 , (unable to do with 2 lbs )  Modalities: Cold pack 8 min   [facility] Adult PT Treatment:                                                DATE:  03/31/23  Therapeutic Activity: UBE 6 min L 3  Semi-reclined for dumbbell strength  Single arm 3 # 2 x 10 full ROM then 0-90 deg x 4 sets  Double arm 8 lbs flexion 0-90 deg x 10 then hover above knees  x 10 pulses Rt UE punch 3 lbs x 15 x 2 sets  Blue band ER x 15 x 2  Lat pull down blue band x 15  Red looped band semi circles each hand x 10 Serratus with foam roller, red band and without x 10 each  Foam roller (long) with Rt UE flexion/scaption 2 lbs x 10 x 2 sets Wall slides with lift off  Seated ER, abduction, punch (mid range) arm at 70 deg abduction, 2-3 lbs or each  Seated abduction x 10 no wgt arm extended, same with flexion (2 lbs) x 10  Standing red band bilateral horizontal abd just below shoulder hgt x 15 Standing red band extension 40 deg x 15 double      PATIENT EDUCATION: Education details: Pt education on PT impairments, prognosis, and POC. Informed consent. Rationale for interventions, safe/appropriate HEP performance Person educated: Patient Education method: Explanation, Demonstration, Tactile cues, Verbal cues, and Handouts Education comprehension: verbalized understanding, returned demonstration, verbal cues required, tactile cues required, and needs further education    HOME EXERCISE PROGRAM:  Access Code: [email or web address] URL: [email or web address] Date: 01/14/2023 Prepared by: Karie Mainland Access Code: [email or web address] URL: [email or web address] Date: 03/08/2023 Prepared by: Karie Mainland  Program Notes - with seated elbow flexion, work on gently straightening and bending the elbow with the support of the other arm  Exercises - Standing Elbow Flexion with Resistance  - 1 x daily - 7 x weekly - 2 sets - 10 reps - 5 hold - Standing Shoulder External Rotation Stretch in Doorway  - 1 x daily - 7 x weekly - 2 sets - 10 reps - 10-20 hold - Standing Shoulder Abduction AAROM with Dowel  - 1 x daily -  7 x weekly - 2 sets - 10 reps - 10 hold -  Standing Shoulder Extension with Dowel  - 1 x daily - 7 x weekly - 2 sets - 10 reps - 10 hold - Standing Shoulder Flexion AAROM with Dowel  - 1 x daily - 7 x weekly - 2 sets - 10 reps - 10 hold - Standing Shoulder Internal Rotation AAROM with Dowel  - 1 x daily - 7 x weekly - 2 sets - 10 reps - 10 hold - Corner Pec Major Stretch  - 1-2 x daily - 7 x weekly - 1 sets - 5 reps - 30 hold - Standing Lat Pull Down with Resistance - Elbows Bent  - 1 x daily - 7 x weekly - 2 sets - 10 reps - 5 hold - Sidelying Shoulder ER with Towel and Dumbbell  - 1 x daily - 7 x weekly - 2 sets - 10 reps - 2 hold - Sidelying Shoulder Horizontal Abduction  - 1 x daily - 7 x weekly - 2 sets - 10 reps - 2 hold - Sidelying Shoulder Scaption  - 1 x daily - 7 x weekly - 2 sets - 10 reps - 2 hold    ASSESSMENT:  CLINICAL IMPRESSION:    Patient was renewed for 8 more weeks of PT today in order to improve shoulder function.  He is currently limited in shoulder strength flexion, abduction and external rotation.   His arm gets fatigued with overhead work    He has been working on eccentric lowering from [facility] for flexion and is able to complete 10 standing shoulder flexion AROM before failure and demonstrated reduced shoulder hike compensations. He was able to complete 2 standing shoulder abduction AROMs before failure and was encouraged work on assisted raise with eccentric lowering.  Continued prone shoulder strengthening for posterior chain support as well as reclined OH strengthening with reduced compensations.  Pt will still benefit from skilled PT to further reduce compensations in shoulder hike with shoulder flexion and abduction.    OBJECTIVE IMPAIRMENTS: decreased activity tolerance, decreased endurance, decreased mobility, decreased ROM, decreased strength, impaired perceived functional ability, impaired flexibility, impaired UE functional use, postural dysfunction, and pain.   ACTIVITY LIMITATIONS: carrying, lifting,  bending, sleeping, transfers, bathing, toileting, dressing, self feeding, reach over head, and hygiene/grooming  PARTICIPATION LIMITATIONS: meal prep, cleaning, laundry, driving, shopping, community activity, occupation, and yard work  PERSONAL FACTORS: Time since onset of injury/illness/exacerbation and 3+ comorbidities: anxiety, DM, HTN  are also affecting patient's functional outcome.   REHAB POTENTIAL: Good  CLINICAL DECISION MAKING: Stable/uncomplicated  EVALUATION COMPLEXITY: Low   GOALS: Goals reviewed with patient? No  SHORT TERM GOALS: Target date: 01/06/2023 Pt will demonstrate appropriate understanding and performance of initially prescribed HEP in order to facilitate improved independence with management of symptoms.  Baseline: HEP provided on eval Goal status:MET   2. Pt will score greater than or equal to 45 on FOTO in order to demonstrate improved perception of function due to symptoms.  Baseline: 29  Goal status: MET  LONG TERM GOALS: Target date: 02/17/2023   Pt will score 64 on FOTO in order to demonstrate improved perception of function due to symptoms. Baseline: 29, 56%  Goal status: ongoing   2.  Pt will demonstrate at least 150 degrees of active shoulder elevation in order to demonstrate improved tolerance to functional movement patterns such as overhead reaching. Baseline: can do this with heavy compensation  Goal status: MET  3.  Pt will demonstrate at least 4+/5 shoulder flex/abduction MMT for improved symmetry of UE strength and improved tolerance to functional movements.  Baseline: deferred on eval given acuity of surgery See above  Goal status: ongoing   4. Pt will report/demonstrate ability to perform upper body dressing with less than 2 point increase in pain on NPS in order to indicate improved tolerance/independence to ADLs.  Baseline: unable to perform, requiring family assist  Goal status:MET   5. Pt will be able to lift at least 40# from  floor to waist with less than 2 pt increase in pain in order to facilitate improved tolerance to work tasks.  Baseline: updated 04/19/23  Goal status: MET   6. Pt will be able to return to work without limitation of pain or functional (driving, pushing and pulling materials 40-50 lbs)   Baseline: able to drive without issues, not lifting on restriction  Goal status: ongoing, is still on light duty  7. Pt will be able to use his Rt UE while driving, reaching with ease   Baseline: difficulty reaching back and to the radio  Goal status : MET   8.  Pt will be able to use his Rt UE while working overhead for 5 min with no difficulty   Baseline:  min to mod difficulty 2 min   Goal status : NEW  PLAN:  PT FREQUENCY: 2x/week  PT DURATION: 8 weeks  PLANNED INTERVENTIONS: 97164- PT Re-evaluation, 97110-Therapeutic exercises, 97530- Therapeutic activity, 97112- Neuromuscular re-education, 97535- Self Care, 96045- Manual therapy, 97014- Electrical stimulation (unattended), 97016- Vasopneumatic device, Patient/Family education, Balance training, Stair training, Taping, Dry Needling, Joint mobilization, Spinal mobilization, Scar mobilization, Cryotherapy, and Moist heat  PLAN FOR NEXT SESSION:  lifting from floor to waist / Upgrade HEP- scapular (try quadruped vs prone)  Progress strength and minimize compensations.  Manual therapy, scapular retraction, posture : STRENGTHEN IN SUPINE AND RECLINED, PROGRESSING TO UPRIGHT.    Karie Mainland, PT 04/19/23 12:09 PM Phone: [phone] Fax: [phone]

## 2023-04-19 ENCOUNTER — Ambulatory Visit: Attending: Orthopaedic Surgery | Admitting: Physical Therapy

## 2023-04-19 ENCOUNTER — Encounter: Payer: Self-pay | Admitting: Physical Therapy

## 2023-04-19 DIAGNOSIS — M25611 Stiffness of right shoulder, not elsewhere classified: Secondary | ICD-10-CM | POA: Diagnosis present

## 2023-04-19 DIAGNOSIS — M6281 Muscle weakness (generalized): Secondary | ICD-10-CM | POA: Diagnosis present

## 2023-04-19 DIAGNOSIS — M25511 Pain in right shoulder: Secondary | ICD-10-CM | POA: Insufficient documentation

## 2023-04-26 ENCOUNTER — Ambulatory Visit: Admitting: Physical Therapy

## 2023-04-26 DIAGNOSIS — M6281 Muscle weakness (generalized): Secondary | ICD-10-CM

## 2023-04-26 DIAGNOSIS — M25611 Stiffness of right shoulder, not elsewhere classified: Secondary | ICD-10-CM

## 2023-04-26 DIAGNOSIS — M25511 Pain in right shoulder: Secondary | ICD-10-CM

## 2023-04-26 NOTE — Therapy (Signed)
 OUTPATIENT PHYSICAL THERAPY SHOULDER TREATMENT  Patient Name: [name] [name] MRN: [id number] DOB:1975/03/20, 48 y.o., male Today's Date: 04/26/2023  END OF SESSION:  PT End of Session - 04/26/23 1021     Visit Number 39    Number of Visits 46    Date for PT Re-Evaluation 06/14/23    Authorization Type [employer]    Authorization Time Period 60VL no auth required    Progress Note Due on Visit 20    PT Start Time 1018    PT Stop Time 1100    PT Time Calculation (min) 42 min    Activity Tolerance Patient tolerated treatment well    Behavior During Therapy WFL for tasks assessed/performed              Past Medical History:  Diagnosis Date   Anxiety    Diabetes mellitus without complication (HCC)    GERD (gastroesophageal reflux disease)    Hypertension    Obesity (BMI 30-39.9)    Plantar fasciitis of right foot    Skin lesion    Tendonitis    Traumatic tear of left rotator cuff 05/20/2016   Past Surgical History:  Procedure Laterality Date   SHOULDER ACROMIOPLASTY Right 11/23/2022   Procedure: RIGHT SHOULDER ACROMIOPLASTY;  Surgeon: Huel Cote, MD;  Location: [facility];  Service: Orthopedics;  Laterality: Right;   SHOULDER ARTHROSCOPY WITH ROTATOR CUFF REPAIR AND SUBACROMIAL DECOMPRESSION Left 05/21/2016   Procedure: SHOULDER ARTHROSCOPY WITH ROTATOR CUFF REPAIR AND SUBACROMIAL DECOMPRESSION LABRAL DEBRIDEMENT;  Surgeon: Salvatore Marvel, MD;  Location: [facility];  Service: Orthopedics;  Laterality: Left;   VASECTOMY     Patient Active Problem List   Diagnosis Date Noted   Traumatic complete tear of right rotator cuff 11/23/2022   Lab test positive for detection of COVID-19 virus 02/23/2022   Paresthesia 10/23/2018   Traumatic tear of left rotator cuff 05/20/2016   Family history of hypertrophic cardiomyopathy 08-11-2014   Family history of sudden cardiac death in son 2014/08/11    PCP: Farris Has, MD  REFERRING PROVIDER: Huel Cote, MD  REFERRING DIAG: [phone] (ICD-10-CM) - Traumatic complete tear of right rotator cuff, initial encounter  THERAPY DIAG:  Stiffness of right shoulder, not elsewhere classified  Right shoulder pain, unspecified chronicity  Muscle weakness (generalized)  Rationale for Evaluation and Treatment: Rehabilitation  ONSET DATE: 11/23/22 rotator cuff repair, superior capsular reconstruction, acromioplasty  SUBJECTIVE:                                                                                                                                                                                      SUBJECTIVE STATEMENT: The  tingling is gone now.  No pain.  Pt frustrated from work today.   EVAL: In August pt was working on a ladder, fell backwards but unsure quite how he landed. States he did have a mild concussion but that has since resolved. Previously independent and active for work. Since surgery on 11/23/22, has been receiving assistance from wife and kids for daily tasks. Sleeping in recliner with sling and pillow under arm. Icing 3-4x/day ~84min.  Hand dominance: Right  PERTINENT HISTORY: BIL traumatic RC tears, anxiety, DM, GERD, HTN H/O Lt RCR 2018  Recent note : MD  01/05/23: 6-week status post right shoulder superior capsular reconstruction overall doing very well.  At this time I discussed I would like him to continue through the active range of motion protocol and to work on overhead active range of motion.  Overall he is doing quite well for this stage.  I will plan to see him back in 6 weeks for reassessment   PAIN:  Are you having pain: none/10 today  Location/description: occasional burning and popping; R shoulder, superiorly Best-worst over past week: 0-7/10  - aggravating factors: sleeping and mornings,  - Easing factors: sling, medication    PRECAUTIONS: s/p R RCR 11/23/22; using Dr. Steward Drone rotator cuff repair protocol per op note   WEIGHT BEARING RESTRICTIONS: Yes  NWB surgical limb  FALLS:  Has patient fallen in last 6 months? Yes. 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 Lifts and carries up to ~50#  PLOF: Independent  PATIENT GOALS: get back to playing golf, shooting pool  NEXT MD VISIT: Nov 18th , Feb 12 , may ?  OBJECTIVE:  Note: Objective measures were completed at Evaluation unless otherwise noted.  DIAGNOSTIC FINDINGS:  S/p shoulder surgery 11/23/22 Panel 1: Right RIGHT SHOULDER ARTHROSCOPY WITH SUPERIOR CAPSULAR RECONSTRUCTION / EXTENSIVE DEBRIDEMENT with Huel Cote, MD  Panel 1: Right RIGHT SHOULDER ACROMIOPLASTY with Huel Cote, MD   PATIENT SURVEYS:  FOTO 29>64 predicted Visit 8 , 12/23/22 : 56%  Visit 16: 01/27/23: NA Visit 23: 02/22/23: 58%:  Visit 38% 04/19/23: 75% DC   COGNITION: Overall cognitive status: Within functional limits for tasks assessed     SENSATION/OBSERVATION: Surgical site obscured by bandaging Pt denies any sensory complaints since nerve block wore off  POSTURE: Sling RUE, increased R UT elevation  UPPER EXTREMITY ROM:  ROM Rt. 02/22/23 Rt.  03/10/23 RT 04/12/23 Rt 04/19/23  Shoulder flexion  153 155 heavy comp 155 Min comp 164  Shoulder abduction 100    165  Shoulder internal rotation Thumb at T12 hand to lumbar  At 90 deg abd supine 80  T8 functional reach  Shoulder external rotation Fingers to T1 At 90 deg abd supine 65  T1 Functional reach   Elbow flexion      Elbow extension      Wrist flexion      Wrist extension       (Blank rows = not tested) (Key: WFL = within functional limits not formally assessed, * = concordant pain, s = stiffness/stretching  sensation, NT = not tested)  Comments: deferred given acuity of surgery, phase 1 on eval, elbow flex/ext AAROM grossly WNL  UPPER EXTREMITY MMT:  MMT  Right eval R 12/27/22  Rt. 01/27/23 Rt. 02/22/23 Rt 03/08/23 Rt.  04/19/23  Shoulder flexion   3-/5 3/5 3/5 3+/5  Shoulder extension        Shoulder abduction   3-/5 3/5 in available range 4/5 4-/5  Shoulder extension        Shoulder internal rotation  5 5/5 5/5  5/5  Shoulder external rotation  4- 4/5 4/5  4/5 4-/5 in 90 deg abd  Elbow flexion        Elbow extension        Grip strength        (Blank rows = not tested)  (Key: WFL = within functional limits not formally assessed, * = concordant pain, s = stiffness/stretching sensation, NT = not tested) Comments: deferred given acuity of surgery  SHOULDER SPECIAL TESTS: Deferred given acuity of surgery  JOINT MOBILITY TESTING:  Deferred given acuity of surgery    [facility] Adult PT Treatment:                                                DATE: 04/26/23 Therapeutic Exercise: RT UE only L 5-9 for 4 min  Therapeutic Activity: Springboard/Slastix: ER and IR with shoulder flexion Shoulder elevation slastix  Shoulder row and extension with roll back bar  Supine GTB chest press added scapular retract/protract Supine red long band flexion and scaption  Supine Rt shoulder narrow press 2 lbs 2 x 10  Skull crusher 4 lbs x 20  Sidelying ER  3 # 2 x 10 Sidelying Abduction (elbow flexed) 2 x 10  Standing wall rebounding with tennis ball and small green ball overhead, abduction Bilateral UE ball overhead with alt UE lift in full flexion.  Double arm "clean" to chest with 25 lbs KB Single arm clean 15 lbs  Bent over Row x 15, 15 lbs , x 10 with 25 lbs   [facility] Adult PT Treatment:                                                DATE: 04/19/23 Therapeutic Exercise: NuStep UE only level 8, 6 min  Therapeutic Activity: MMT, ROM measurement  Row 4 plates x 20  ER 1 plate x 15 Flexion 3 x 10, 1  plate  30 sec x 3 Rt UE 5 lbs overhead hold static standing  Straight arm pull down slastix  Lower trap activation using wall and foam roller  Shoulder flexion AROM Rt UE at wall unable to get full ROM due to fatigue   04/14/23:  Therapeutic Activity: UBE Level 3 x 5 min Pulleys  Standing shoulder flexion AROM x 10 (failure)  Right shouler abduction x 2 (failure) Standing cane AA shoulder abduction with eccentric lowering x 10  Attempted belt to depress shoulder with AROM Right shoulder Row cable 23# 15 x 2  13# right pulldown right 20 x 1 ER Blue 10 x 2  Standing horiz abdct Blue band attached  Reclined: right PNF x 15 Reclined 2# shoulder flexion x 10, 3# x 10 Side shoulder abdct 4# 2 x 10  Side ER 3# 10 x2 Prone scap retract + ext 10 x 2  Prone horiz abdct thumbs up Prone Right ER  from 90 degrees 10 x 2 2# thumb in and thumb up Modalities: Ice pack post session to decrease soreness  PATIENT EDUCATION: Education details: Pt education on PT impairments, prognosis, and POC. Informed consent. Rationale for interventions, safe/appropriate HEP performance Person educated: Patient Education method: Explanation, Demonstration, Tactile cues, Verbal cues, and Handouts Education comprehension: verbalized understanding, returned demonstration, verbal cues required, tactile cues required, and needs further education    HOME EXERCISE PROGRAM:  Access Code: [email or web address] URL: [email or web address] Date: 01/14/2023 Prepared by: Karie Mainland  Program Notes - with seated elbow flexion, work on gently straightening and bending the elbow with the support of the other arm  Exercises - Standing Elbow Flexion with Resistance  - 1 x daily - 7 x weekly - 2 sets - 10 reps - 5 hold - Standing Shoulder External Rotation Stretch in Doorway  - 1 x daily - 7 x weekly - 2 sets - 10 reps - 10-20 hold - Standing Shoulder Abduction AAROM with Dowel  - 1 x daily - 7 x weekly - 2 sets - 10 reps  - 10 hold - Standing Shoulder Extension with Dowel  - 1 x daily - 7 x weekly - 2 sets - 10 reps - 10 hold - Standing Shoulder Flexion AAROM with Dowel  - 1 x daily - 7 x weekly - 2 sets - 10 reps - 10 hold - Standing Shoulder Internal Rotation AAROM with Dowel  - 1 x daily - 7 x weekly - 2 sets - 10 reps - 10 hold - Corner Pec Major Stretch  - 1-2 x daily - 7 x weekly - 1 sets - 5 reps - 30 hold - Standing Lat Pull Down with Resistance - Elbows Bent  - 1 x daily - 7 x weekly - 2 sets - 10 reps - 5 hold - Sidelying Shoulder ER with Towel and Dumbbell  - 1 x daily - 7 x weekly - 2 sets - 10 reps - 2 hold - Sidelying Shoulder Horizontal Abduction  - 1 x daily - 7 x weekly - 2 sets - 10 reps - 2 hold - Sidelying Shoulder Scaption  - 1 x daily - 7 x weekly - 2 sets - 10 reps - 2 hold    ASSESSMENT:  CLINICAL IMPRESSION:   Patient continues to work on the goal of normalized overhead reaching while minimizing trunk compensation.  He was able to work in supine and sidelying to allow gravity assist and stability on the mat. Cont POC.     OBJECTIVE IMPAIRMENTS: decreased activity tolerance, decreased endurance, decreased mobility, decreased ROM, decreased strength, impaired perceived functional ability, impaired flexibility, impaired UE functional use, postural dysfunction, and pain.   ACTIVITY LIMITATIONS: carrying, lifting, bending, sleeping, transfers, bathing, toileting, dressing, self feeding, reach over head, and hygiene/grooming  PARTICIPATION LIMITATIONS: meal prep, cleaning, laundry, driving, shopping, community activity, occupation, and yard work  PERSONAL FACTORS: Time since onset of injury/illness/exacerbation and 3+ comorbidities: anxiety, DM, HTN  are also affecting patient's functional outcome.   REHAB POTENTIAL: Good  CLINICAL DECISION MAKING: Stable/uncomplicated  EVALUATION COMPLEXITY: Low   GOALS: Goals reviewed with patient? No  SHORT TERM GOALS: Target date:  01/06/2023 Pt will demonstrate appropriate understanding and performance of initially prescribed HEP in order to facilitate improved independence with management of symptoms.  Baseline: HEP provided on eval Goal status:MET   2. Pt will score greater than or equal to 45 on FOTO in order to demonstrate improved perception of function due to  symptoms.  Baseline: 29  Goal status: MET  LONG TERM GOALS: Target date: 02/17/2023   Pt will score 64 on FOTO in order to demonstrate improved perception of function due to symptoms. Baseline: 29, 56%  Goal status: ongoing   2.  Pt will demonstrate at least 150 degrees of active shoulder elevation in order to demonstrate improved tolerance to functional movement patterns such as overhead reaching. Baseline: can do this with heavy compensation  Goal status: MET   3.  Pt will demonstrate at least 4+/5 shoulder flex/abduction MMT for improved symmetry of UE strength and improved tolerance to functional movements.  Baseline: deferred on eval given acuity of surgery See above  Goal status: ongoing   4. Pt will report/demonstrate ability to perform upper body dressing with less than 2 point increase in pain on NPS in order to indicate improved tolerance/independence to ADLs.  Baseline: unable to perform, requiring family assist  Goal status:MET   5. Pt will be able to lift at least 40# from floor to waist with less than 2 pt increase in pain in order to facilitate improved tolerance to work tasks.  Baseline: updated 04/19/23  Goal status: MET   6. Pt will be able to return to work without limitation of pain or functional (driving, pushing and pulling materials 40-50 lbs)   Baseline: able to drive without issues, not lifting on restriction  Goal status: ongoing, is still on light duty  7. Pt will be able to use his Rt UE while driving, reaching with ease   Baseline: difficulty reaching back and to the radio  Goal status : MET   8.  Pt will be able to  use his Rt UE while working overhead for 5 min with no difficulty   Baseline:  min to mod difficulty 2 min   Goal status : NEW  PLAN:  PT FREQUENCY: 2x/week  PT DURATION: 8 weeks  PLANNED INTERVENTIONS: 97164- PT Re-evaluation, 97110-Therapeutic exercises, 97530- Therapeutic activity, 97112- Neuromuscular re-education, 97535- Self Care, 14782- Manual therapy, 97014- Electrical stimulation (unattended), 97016- Vasopneumatic device, Patient/Family education, Balance training, Stair training, Taping, Dry Needling, Joint mobilization, Spinal mobilization, Scar mobilization, Cryotherapy, and Moist heat  PLAN FOR NEXT SESSION:  lifting from floor to waist / Upgrade HEP- scapular (try quadruped vs prone)  Progress strength and minimize compensations.  Manual therapy, scapular retraction, posture : STRENGTHEN IN SUPINE AND RECLINED, PROGRESSING TO UPRIGHT.    Karie Mainland, PT 04/26/23 1:34 PM Phone: [phone] Fax: [phone]

## 2023-04-28 ENCOUNTER — Encounter: Admitting: Physical Therapy

## 2023-04-29 NOTE — Therapy (Signed)
 OUTPATIENT PHYSICAL THERAPY SHOULDER TREATMENT  Patient Name: [name] [name] MRN: [id number] DOB:January 04, 1976, 48 y.o., male Today's Date: 05/02/2023  END OF SESSION:  PT End of Session - 05/02/23 0854     Visit Number 40    Number of Visits 46    Date for PT Re-Evaluation 06/14/23    Authorization Type [employer]    Authorization Time Period 60VL no auth required    Progress Note Due on Visit 20    PT Start Time 0850    PT Stop Time 0930    PT Time Calculation (min) 40 min    Activity Tolerance Patient tolerated treatment well    Behavior During Therapy WFL for tasks assessed/performed               Past Medical History:  Diagnosis Date   Anxiety    Diabetes mellitus without complication (HCC)    GERD (gastroesophageal reflux disease)    Hypertension    Obesity (BMI 30-39.9)    Plantar fasciitis of right foot    Skin lesion    Tendonitis    Traumatic tear of left rotator cuff 05/20/2016   Past Surgical History:  Procedure Laterality Date   SHOULDER ACROMIOPLASTY Right 11/23/2022   Procedure: RIGHT SHOULDER ACROMIOPLASTY;  Surgeon: Wilhelmenia Harada, MD;  Location: [facility];  Service: Orthopedics;  Laterality: Right;   SHOULDER ARTHROSCOPY WITH ROTATOR CUFF REPAIR AND SUBACROMIAL DECOMPRESSION Left 05/21/2016   Procedure: SHOULDER ARTHROSCOPY WITH ROTATOR CUFF REPAIR AND SUBACROMIAL DECOMPRESSION LABRAL DEBRIDEMENT;  Surgeon: Elly Habermann, MD;  Location: [facility];  Service: Orthopedics;  Laterality: Left;   VASECTOMY     Patient Active Problem List   Diagnosis Date Noted   Traumatic complete tear of right rotator cuff 11/23/2022   Lab test positive for detection of COVID-19 virus 02/23/2022   Paresthesia 10/23/2018   Traumatic tear of left rotator cuff 05/20/2016   Family history of hypertrophic cardiomyopathy 08/03/14   Family history of sudden cardiac death in son 08-03-2014    PCP: Ronna Coho, MD  REFERRING PROVIDER:  Wilhelmenia Harada, MD  REFERRING DIAG: [phone] (ICD-10-CM) - Traumatic complete tear of right rotator cuff, initial encounter  THERAPY DIAG:  Stiffness of right shoulder, not elsewhere classified  Right shoulder pain, unspecified chronicity  Muscle weakness (generalized)  Rationale for Evaluation and Treatment: Rehabilitation  ONSET DATE: 11/23/22 rotator cuff repair, superior capsular reconstruction, acromioplasty  SUBJECTIVE:                                                                                                                                                                                      SUBJECTIVE STATEMENT:  No pain. Played golf yesterday.    EVAL: In August pt was working on a ladder, fell backwards but unsure quite how he landed. States he did have a mild concussion but that has since resolved. Previously independent and active for work. Since surgery on 11/23/22, has been receiving assistance from wife and kids for daily tasks. Sleeping in recliner with sling and pillow under arm. Icing 3-4x/day ~70min.  Hand dominance: Right  PERTINENT HISTORY: BIL traumatic RC tears, anxiety, DM, GERD, HTN H/O Lt RCR 2018  Recent note : MD  01/05/23: 6-week status post right shoulder superior capsular reconstruction overall doing very well.  At this time I discussed I would like him to continue through the active range of motion protocol and to work on overhead active range of motion.  Overall he is doing quite well for this stage.  I will plan to see him back in 6 weeks for reassessment   PAIN:  Are you having pain: none/10 today  Location/description: occasional burning and popping; R shoulder, superiorly Best-worst over past week: 0-7/10  - aggravating factors: sleeping and mornings,  - Easing factors: sling, medication    PRECAUTIONS: s/p R RCR 11/23/22; using Dr. Hermina Loosen rotator cuff repair protocol per op note   WEIGHT BEARING RESTRICTIONS: Yes NWB surgical  limb  FALLS:  Has patient fallen in last 6 months? Yes. 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 Lifts and carries up to ~50#  PLOF: Independent  PATIENT GOALS: get back to playing golf, shooting pool  NEXT MD VISIT: Nov 18th , Feb 12 , may ?  OBJECTIVE:  Note: Objective measures were completed at Evaluation unless otherwise noted.  DIAGNOSTIC FINDINGS:  S/p shoulder surgery 11/23/22 Panel 1: Right RIGHT SHOULDER ARTHROSCOPY WITH SUPERIOR CAPSULAR RECONSTRUCTION / EXTENSIVE DEBRIDEMENT with Wilhelmenia Harada, MD  Panel 1: Right RIGHT SHOULDER ACROMIOPLASTY with Wilhelmenia Harada, MD   PATIENT SURVEYS:  FOTO 29>64 predicted Visit 8 , 12/23/22 : 56%  Visit 16: 01/27/23: NA Visit 23: 02/22/23: 58%:  Visit 38% 04/19/23: 75% DC   COGNITION: Overall cognitive status: Within functional limits for tasks assessed     SENSATION/OBSERVATION: Surgical site obscured by bandaging Pt denies any sensory complaints since nerve block wore off  POSTURE: Sling RUE, increased R UT elevation  UPPER EXTREMITY ROM:  ROM Rt. 02/22/23 Rt.  03/10/23 RT 04/12/23 Rt 04/19/23 Rt  05/02/23  Shoulder flexion  153 155 heavy comp 155 Min comp 164 165  Shoulder abduction 100    165   Shoulder internal rotation Thumb at T12 hand to lumbar  At 90 deg abd supine 80  T8 functional reach T9 FR  Shoulder external rotation Fingers to T1 At 90 deg abd supine 65  T1 Functional reach  T1 FR  Elbow flexion       Elbow extension       Wrist flexion       Wrist extension        (Blank rows = not tested) (Key: WFL = within functional limits not formally assessed, * = concordant pain, s  = stiffness/stretching sensation, NT = not tested)  Comments: deferred given acuity of surgery, phase 1 on eval, elbow flex/ext AAROM grossly WNL  UPPER EXTREMITY MMT:  MMT Right eval R 12/27/22  Rt. 01/27/23 Rt. 02/22/23 Rt 03/08/23 Rt.  04/19/23  Shoulder flexion   3-/5 3/5 3/5 3+/5  Shoulder extension        Shoulder abduction   3-/5 3/5 in available range 4/5 4-/5  Shoulder extension        Shoulder internal rotation  5 5/5 5/5  5/5  Shoulder external rotation  4- 4/5 4/5  4/5 4-/5 in 90 deg abd  Elbow flexion        Elbow extension        Grip strength        (Blank rows = not tested)  (Key: WFL = within functional limits not formally assessed, * = concordant pain, s = stiffness/stretching sensation, NT = not tested) Comments: deferred given acuity of surgery  SHOULDER SPECIAL TESTS: Deferred given acuity of surgery  JOINT MOBILITY TESTING:  Deferred given acuity of surgery  [facility] Adult PT Treatment:                                                DATE: 05/02/23 Therapeutic Exercise: UBE level 3 , 3 min each direction  Row narrow and wide  ER in abduction  Elevation red band x 15  Looped blue band press out in neutral  Added overhead lift x 10 full ROM  Closed chain shoulder work at wall- flexion difficulty CCK in Quadruped: flexion, abduction 2-3 lbs 2 x 10  Low plank on BOSU mat table 45 sec added small range mtn. Climber  High plank on high mat alternating arm lift  Supine flexion 3 lbs x 15, 4 lbs x 10 and 5 lbs x 10  Skull crusher 5 lbs x 15 Rt UE biceps with ER/IR x 15   [facility] Adult PT Treatment:                                                DATE: 04/26/23 Therapeutic Exercise: RT UE only L 5-9 for 4 min  Therapeutic Activity: Springboard/Slastix: ER and IR with shoulder flexion Shoulder elevation slastix  Shoulder row and extension with roll back bar  Supine GTB chest press added scapular retract/protract Supine red long band flexion and scaption  Supine Rt  shoulder narrow press 2 lbs 2 x 10  Skull crusher 4 lbs x 20  Sidelying ER  3 # 2 x 10 Sidelying Abduction (elbow flexed) 2 x 10  Standing wall rebounding with tennis ball and small green ball overhead, abduction Bilateral UE ball overhead with alt UE lift in full flexion.  Double arm "clean" to chest with 25 lbs KB Single arm clean 15 lbs  Bent over Row x 15, 15 lbs , x 10 with 25 lbs   [facility] Adult PT Treatment:                                                DATE: 04/19/23 Therapeutic Exercise: NuStep UE only level 8, 6 min  Therapeutic Activity: MMT, ROM measurement  Row 4 plates x 20  ER 1 plate  x 15 Flexion 3 x 10, 1 plate  30 sec x 3 Rt UE 5 lbs overhead hold static standing  Straight arm pull down slastix  Lower trap activation using wall and foam roller  Shoulder flexion AROM Rt UE at wall unable to get full ROM due to fatigue   04/14/23:  Therapeutic Activity: UBE Level 3 x 5 min Pulleys  Standing shoulder flexion AROM x 10 (failure)  Right shouler abduction x 2 (failure) Standing cane AA shoulder abduction with eccentric lowering x 10  Attempted belt to depress shoulder with AROM Right shoulder Row cable 23# 15 x 2  13# right pulldown right 20 x 1 ER Blue 10 x 2  Standing horiz abdct Blue band attached  Reclined: right PNF x 15 Reclined 2# shoulder flexion x 10, 3# x 10 Side shoulder abdct 4# 2 x 10  Side ER 3# 10 x2 Prone scap retract + ext 10 x 2  Prone horiz abdct thumbs up Prone Right ER from 90 degrees 10 x 2 2# thumb in and thumb up Modalities: Ice pack post session to decrease soreness  PATIENT EDUCATION: Education details: Pt education on PT impairments, prognosis, and POC. Informed consent. Rationale for interventions, safe/appropriate HEP performance Person educated: Patient Education method: Explanation, Demonstration, Tactile cues, Verbal cues, and Handouts Education comprehension: verbalized understanding, returned demonstration, verbal cues  required, tactile cues required, and needs further education    HOME EXERCISE PROGRAM:  Access Code: [email or web address] URL: [email or web address] Date: 01/14/2023 Prepared by: Marci Setter  Program Notes - with seated elbow flexion, work on gently straightening and bending the elbow with the support of the other arm  Exercises - Standing Elbow Flexion with Resistance  - 1 x daily - 7 x weekly - 2 sets - 10 reps - 5 hold - Standing Shoulder External Rotation Stretch in Doorway  - 1 x daily - 7 x weekly - 2 sets - 10 reps - 10-20 hold - Standing Shoulder Abduction AAROM with Dowel  - 1 x daily - 7 x weekly - 2 sets - 10 reps - 10 hold - Standing Shoulder Extension with Dowel  - 1 x daily - 7 x weekly - 2 sets - 10 reps - 10 hold - Standing Shoulder Flexion AAROM with Dowel  - 1 x daily - 7 x weekly - 2 sets - 10 reps - 10 hold - Standing Shoulder Internal Rotation AAROM with Dowel  - 1 x daily - 7 x weekly - 2 sets - 10 reps - 10 hold - Corner Pec Major Stretch  - 1-2 x daily - 7 x weekly - 1 sets - 5 reps - 30 hold - Standing Lat Pull Down with Resistance - Elbows Bent  - 1 x daily - 7 x weekly - 2 sets - 10 reps - 5 hold - Sidelying Shoulder ER with Towel and Dumbbell  - 1 x daily - 7 x weekly - 2 sets - 10 reps - 2 hold - Sidelying Shoulder Horizontal Abduction  - 1 x daily - 7 x weekly - 2 sets - 10 reps - 2 hold - Sidelying Shoulder Scaption  - 1 x daily - 7 x weekly - 2 sets - 10 reps - 2 hold    ASSESSMENT:  CLINICAL IMPRESSION:   Patient continues to work on the goal of normalized overhead reaching while minimizing trunk compensation.  He was able to work in supine and sidelying to allow gravity assist and stability on the mat.  Cont POC.     OBJECTIVE IMPAIRMENTS: decreased activity tolerance, decreased endurance, decreased mobility, decreased ROM, decreased strength, impaired perceived functional ability, impaired flexibility, impaired UE functional use, postural  dysfunction, and pain.   ACTIVITY LIMITATIONS: carrying, lifting, bending, sleeping, transfers, bathing, toileting, dressing, self feeding, reach over head, and hygiene/grooming  PARTICIPATION LIMITATIONS: meal prep, cleaning, laundry, driving, shopping, community activity, occupation, and yard work  PERSONAL FACTORS: Time since onset of injury/illness/exacerbation and 3+ comorbidities: anxiety, DM, HTN  are also affecting patient's functional outcome.   REHAB POTENTIAL: Good  CLINICAL DECISION MAKING: Stable/uncomplicated  EVALUATION COMPLEXITY: Low   GOALS: Goals reviewed with patient? No  SHORT TERM GOALS: Target date: 01/06/2023 Pt will demonstrate appropriate understanding and performance of initially prescribed HEP in order to facilitate improved independence with management of symptoms.  Baseline: HEP provided on eval Goal status:MET   2. Pt will score greater than or equal to 45 on FOTO in order to demonstrate improved perception of function due to symptoms.  Baseline: 29  Goal status: MET  LONG TERM GOALS: Target date: 02/17/2023   Pt will score 64 on FOTO in order to demonstrate improved perception of function due to symptoms. Baseline: 29, 56%  Goal status: ongoing   2.  Pt will demonstrate at least 150 degrees of active shoulder elevation in order to demonstrate improved tolerance to functional movement patterns such as overhead reaching. Baseline: can do this with heavy compensation  Goal status: MET   3.  Pt will demonstrate at least 4+/5 shoulder flex/abduction MMT for improved symmetry of UE strength and improved tolerance to functional movements.  Baseline: deferred on eval given acuity of surgery See above  Goal status: ongoing   4. Pt will report/demonstrate ability to perform upper body dressing with less than 2 point increase in pain on NPS in order to indicate improved tolerance/independence to ADLs.  Baseline: unable to perform, requiring family  assist  Goal status:MET   5. Pt will be able to lift at least 40# from floor to waist with less than 2 pt increase in pain in order to facilitate improved tolerance to work tasks.  Baseline: updated 04/19/23  Goal status: MET   6. Pt will be able to return to work without limitation of pain or functional (driving, pushing and pulling materials 40-50 lbs)   Baseline: able to drive without issues, not lifting on restriction  Goal status: ongoing, is still on light duty  7. Pt will be able to use his Rt UE while driving, reaching with ease   Baseline: difficulty reaching back and to the radio  Goal status : MET   8.  Pt will be able to use his Rt UE while working overhead for 5 min with no difficulty   Baseline:  min to mod difficulty 2 min   Goal status : NEW  PLAN:  PT FREQUENCY: 2x/week  PT DURATION: 8 weeks  PLANNED INTERVENTIONS: 97164- PT Re-evaluation, 97110-Therapeutic exercises, 97530- Therapeutic activity, 97112- Neuromuscular re-education, 97535- Self Care, 40981- Manual therapy, 97014- Electrical stimulation (unattended), 97016- Vasopneumatic device, Patient/Family education, Balance training, Stair training, Taping, Dry Needling, Joint mobilization, Spinal mobilization, Scar mobilization, Cryotherapy, and Moist heat  PLAN FOR NEXT SESSION:  lifting from floor to waist / Upgrade HEP- scapular (try quadruped vs prone)  Progress strength and minimize compensations.  Manual therapy, scapular retraction, posture : STRENGTHEN IN SUPINE AND RECLINED, PROGRESSING TO UPRIGHT.    Marci Setter, PT 05/02/23 9:35 AM Phone: [phone] Fax: [phone]

## 2023-05-02 ENCOUNTER — Ambulatory Visit: Admitting: Physical Therapy

## 2023-05-02 ENCOUNTER — Encounter: Payer: Self-pay | Admitting: Physical Therapy

## 2023-05-02 DIAGNOSIS — M6281 Muscle weakness (generalized): Secondary | ICD-10-CM

## 2023-05-02 DIAGNOSIS — M25511 Pain in right shoulder: Secondary | ICD-10-CM

## 2023-05-02 DIAGNOSIS — M25611 Stiffness of right shoulder, not elsewhere classified: Secondary | ICD-10-CM

## 2023-05-04 ENCOUNTER — Encounter: Admitting: Physical Therapy

## 2023-05-09 NOTE — Therapy (Unsigned)
 OUTPATIENT PHYSICAL THERAPY SHOULDER TREATMENT  Patient Name: [name] [name] MRN: [id number] DOB:[birth year], 48 y.o., male Today's Date: 05/10/2023  END OF SESSION:  PT End of Session - 05/10/23 0808     Visit Number 41    Number of Visits 46    Date for PT Re-Evaluation 06/14/23    Authorization Type [employer]    Authorization Time Period 60VL no auth required    PT Start Time 0800    PT Stop Time 0845    PT Time Calculation (min) 45 min    Activity Tolerance Patient tolerated treatment well    Behavior During Therapy WFL for tasks assessed/performed                Past Medical History:  Diagnosis Date   Anxiety    Diabetes mellitus without complication (HCC)    GERD (gastroesophageal reflux disease)    Hypertension    Obesity (BMI 30-39.9)    Plantar fasciitis of right foot    Skin lesion    Tendonitis    Traumatic tear of left rotator cuff 05/20/2016   Past Surgical History:  Procedure Laterality Date   SHOULDER ACROMIOPLASTY Right 11/23/2022   Procedure: RIGHT SHOULDER ACROMIOPLASTY;  Surgeon: Wilhelmenia Harada, MD;  Location: [facility];  Service: Orthopedics;  Laterality: Right;   SHOULDER ARTHROSCOPY WITH ROTATOR CUFF REPAIR AND SUBACROMIAL DECOMPRESSION Left 05/21/2016   Procedure: SHOULDER ARTHROSCOPY WITH ROTATOR CUFF REPAIR AND SUBACROMIAL DECOMPRESSION LABRAL DEBRIDEMENT;  Surgeon: Elly Habermann, MD;  Location: [facility];  Service: Orthopedics;  Laterality: Left;   VASECTOMY     Patient Active Problem List   Diagnosis Date Noted   Traumatic complete tear of right rotator cuff 11/23/2022   Lab test positive for detection of COVID-19 virus 02/23/2022   Paresthesia 10/23/2018   Traumatic tear of left rotator cuff 05/20/2016   Family history of hypertrophic cardiomyopathy August 13, 2014   Family history of sudden cardiac death in son 13-Aug-2014    PCP: Ronna Coho, MD  REFERRING PROVIDER: Wilhelmenia Harada, MD  REFERRING DIAG:  [phone] (ICD-10-CM) - Traumatic complete tear of right rotator cuff, initial encounter  THERAPY DIAG:  Stiffness of right shoulder, not elsewhere classified  Right shoulder pain, unspecified chronicity  Muscle weakness (generalized)  Rationale for Evaluation and Treatment: Rehabilitation  ONSET DATE: 11/23/22 rotator cuff repair, superior capsular reconstruction, acromioplasty  SUBJECTIVE:                                                                                                                                                                                      SUBJECTIVE STATEMENT: Played golf this weekend, no issues currently with  shoulder.    EVAL: In August pt was working on a ladder, fell backwards but unsure quite how he landed. States he did have a mild concussion but that has since resolved. Previously independent and active for work. Since surgery on 11/23/22, has been receiving assistance from wife and kids for daily tasks. Sleeping in recliner with sling and pillow under arm. Icing 3-4x/day ~17min.  Hand dominance: Right  PERTINENT HISTORY: BIL traumatic RC tears, anxiety, DM, GERD, HTN H/O Lt RCR 2018  Recent note : MD  01/05/23: 6-week status post right shoulder superior capsular reconstruction overall doing very well.  At this time I discussed I would like him to continue through the active range of motion protocol and to work on overhead active range of motion.  Overall he is doing quite well for this stage.  I will plan to see him back in 6 weeks for reassessment   PAIN:  Are you having pain: none/10 today  Location/description: occasional burning and popping; R shoulder, superiorly Best-worst over past week: 0-7/10  - aggravating factors: sleeping and mornings,  - Easing factors: sling, medication    PRECAUTIONS: s/p R RCR 11/23/22; using Dr. Hermina Loosen rotator cuff repair protocol per op note   WEIGHT BEARING RESTRICTIONS: Yes NWB surgical limb  FALLS:  Has  patient fallen in last 6 months? Yes. 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 Lifts and carries up to ~50#  PLOF: Independent  PATIENT GOALS: get back to playing golf, shooting pool  NEXT MD VISIT: Nov 18th , Feb 12 , may ?  OBJECTIVE:  Note: Objective measures were completed at Evaluation unless otherwise noted.  DIAGNOSTIC FINDINGS:  S/p shoulder surgery 11/23/22 Panel 1: Right RIGHT SHOULDER ARTHROSCOPY WITH SUPERIOR CAPSULAR RECONSTRUCTION / EXTENSIVE DEBRIDEMENT with Wilhelmenia Harada, MD  Panel 1: Right RIGHT SHOULDER ACROMIOPLASTY with Wilhelmenia Harada, MD   PATIENT SURVEYS:  FOTO 29>64 predicted Visit 8 , 12/23/22 : 56%  Visit 16: 01/27/23: NA Visit 23: 02/22/23: 58%:  Visit 38% 04/19/23: 75% DC   COGNITION: Overall cognitive status: Within functional limits for tasks assessed     SENSATION/OBSERVATION: Surgical site obscured by bandaging Pt denies any sensory complaints since nerve block wore off  POSTURE: Sling RUE, increased R UT elevation  UPPER EXTREMITY ROM:  ROM Rt. 02/22/23 Rt.  03/10/23 RT 04/12/23 Rt 04/19/23 Rt  05/02/23 Rt.  05/10/23  Shoulder flexion  153 155 heavy comp 155 Min comp 164 165 168  Shoulder abduction 100    165  168  Shoulder internal rotation Thumb at T12 hand to lumbar  At 90 deg abd supine 80  T8 functional reach T9 FR   Shoulder external rotation Fingers to T1 At 90 deg abd supine 65  T1 Functional reach  T1 FR   Elbow flexion        Elbow extension        Wrist flexion        Wrist extension         (Blank rows = not tested) (Key: WFL = within functional limits not formally assessed, * = concordant  pain, s = stiffness/stretching sensation, NT = not tested)  Comments: deferred given acuity of surgery, phase 1  on eval, elbow flex/ext AAROM grossly WNL  UPPER EXTREMITY MMT:  MMT Right eval R 12/27/22  Rt. 01/27/23 Rt. 02/22/23 Rt 03/08/23 Rt.  04/19/23  Shoulder flexion   3-/5 3/5 3/5 3+/5  Shoulder extension        Shoulder abduction   3-/5 3/5 in available range 4/5 4-/5  Shoulder extension        Shoulder internal rotation  5 5/5 5/5  5/5  Shoulder external rotation  4- 4/5 4/5  4/5 4-/5 in 90 deg abd  Elbow flexion        Elbow extension        Grip strength        (Blank rows = not tested)  (Key: WFL = within functional limits not formally assessed, * = concordant pain, s = stiffness/stretching sensation, NT = not tested) Comments: deferred given acuity of surgery  SHOULDER SPECIAL TESTS: Deferred given acuity of surgery  JOINT MOBILITY TESTING:  Deferred given acuity of surgery   [facility] Adult PT Treatment:                                                DATE: 05/10/23  UBE level 3, 3 min each direction  Supine and sidelying: strengthening with dumbbells Flexion 4 lbs (bent elbow and elbow extended)  5 lbs narrow press x 10 x 2 sets  Skull crusher 5 lbs x 15 Circles (small) 5 lbs x 20  Sidelying flexion 3 lbs x 15  Abduction, scaption x 4 lbs x 10 x 2  ER 3 lbs x  15 , 4 lbs x 10  Standing diagonal (red band) heavy comps Single arm pull  Bilateral black upright row , bicep curl x 15 each standing  Green looped band x 15 against the wall Serratus lift Flexion with green looped band elbows extended x 2 x 8 Dowel mobility: extension, IR and abduction x 10 to tolerance   [facility] Adult PT Treatment:                                                DATE: 05/02/23 Therapeutic Exercise: UBE level 3 , 3 min each direction  Row narrow and wide  ER in abduction  Elevation red band x 15  Looped blue band press out in neutral  Added overhead lift x 10 full ROM  Closed chain  shoulder work at wall- flexion difficulty CCK in Quadruped: flexion, abduction 2-3 lbs 2 x 10  Low plank on BOSU mat table 45 sec added small range mtn. Climber  High plank on high mat alternating arm lift  Supine flexion 3 lbs x 15, 4 lbs x 10 and 5 lbs x 10  Skull crusher 5 lbs x 15 Rt UE biceps with ER/IR x 15   [facility] Adult PT Treatment:                                                DATE: 04/26/23 Therapeutic Exercise: RT UE only L 5-9 for 4 min  Therapeutic Activity: Springboard/Slastix: ER and IR with shoulder flexion Shoulder  elevation slastix  Shoulder row and extension with roll back bar  Supine GTB chest press added scapular retract/protract Supine red long band flexion and scaption  Supine Rt shoulder narrow press 2 lbs 2 x 10  Skull crusher 4 lbs x 20  Sidelying ER  3 # 2 x 10 Sidelying Abduction (elbow flexed) 2 x 10  Standing wall rebounding with tennis ball and small green ball overhead, abduction Bilateral UE ball overhead with alt UE lift in full flexion.  Double arm "clean" to chest with 25 lbs KB Single arm clean 15 lbs  Bent over Row x 15, 15 lbs , x 10 with 25 lbs   [facility] Adult PT Treatment:                                                DATE: 04/19/23 Therapeutic Exercise: NuStep UE only level 8, 6 min  Therapeutic Activity: MMT, ROM measurement  Row 4 plates x 20  ER 1 plate x 15 Flexion 3 x 10, 1 plate  30 sec x 3 Rt UE 5 lbs overhead hold static standing  Straight arm pull down slastix  Lower trap activation using wall and foam roller  Shoulder flexion AROM Rt UE at wall unable to get full ROM due to fatigue   04/14/23:  Therapeutic Activity: UBE Level 3 x 5 min Pulleys  Standing shoulder flexion AROM x 10 (failure)  Right shouler abduction x 2 (failure) Standing cane AA shoulder abduction with eccentric lowering x 10  Attempted belt to depress shoulder with AROM Right shoulder Row cable 23# 15 x 2  13# right pulldown right 20 x 1 ER Blue 10 x 2   Standing horiz abdct Blue band attached  Reclined: right PNF x 15 Reclined 2# shoulder flexion x 10, 3# x 10 Side shoulder abdct 4# 2 x 10  Side ER 3# 10 x2 Prone scap retract + ext 10 x 2  Prone horiz abdct thumbs up Prone Right ER from 90 degrees 10 x 2 2# thumb in and thumb up Modalities: Ice pack post session to decrease soreness  PATIENT EDUCATION: Education details: Pt education on PT impairments, prognosis, and POC. Informed consent. Rationale for interventions, safe/appropriate HEP performance Person educated: Patient Education method: Explanation, Demonstration, Tactile cues, Verbal cues, and Handouts Education comprehension: verbalized understanding, returned demonstration, verbal cues required, tactile cues required, and needs further education    HOME EXERCISE PROGRAM:  Access Code: [email or web address] URL: [email or web address] Date: 01/14/2023 Prepared by: Marci Setter  Program Notes - with seated elbow flexion, work on gently straightening and bending the elbow with the support of the other arm  Exercises - Standing Elbow Flexion with Resistance  - 1 x daily - 7 x weekly - 2 sets - 10 reps - 5 hold - Standing Shoulder External Rotation Stretch in Doorway  - 1 x daily - 7 x weekly - 2 sets - 10 reps - 10-20 hold - Standing Shoulder Abduction AAROM with Dowel  - 1 x daily - 7 x weekly - 2 sets - 10 reps - 10 hold - Standing Shoulder Extension with Dowel  - 1 x daily - 7 x weekly - 2 sets - 10 reps - 10 hold - Standing Shoulder Flexion AAROM with Dowel  - 1 x daily - 7 x weekly - 2 sets - 10 reps -  10 hold - Standing Shoulder Internal Rotation AAROM with Dowel  - 1 x daily - 7 x weekly - 2 sets - 10 reps - 10 hold - Corner Pec Major Stretch  - 1-2 x daily - 7 x weekly - 1 sets - 5 reps - 30 hold - Standing Lat Pull Down with Resistance - Elbows Bent  - 1 x daily - 7 x weekly - 2 sets - 10 reps - 5 hold - Sidelying Shoulder ER with Towel and Dumbbell  - 1 x daily -  7 x weekly - 2 sets - 10 reps - 2 hold - Sidelying Shoulder Horizontal Abduction  - 1 x daily - 7 x weekly - 2 sets - 10 reps - 2 hold - Sidelying Shoulder Scaption  - 1 x daily - 7 x weekly - 2 sets - 10 reps - 2 hold    ASSESSMENT:  CLINICAL IMPRESSION:   Patient shows decreased compensation in right upper extremity with overhead reaching and both supine and standing.  He demonstrates the most limitations in standing diagonal reaching, combining abduction and external rotation.  He has no pain during the session.  He is back to near full function but does still exhibit weakness in abduction and flexion. He will continue therapy for about 3-4 more weeks he sees his surgeon at that time and will likely be discharged from therapy.  Patient continues to work on the goal of normalized overhead reaching while minimizing trunk compensation.  He was able to work in supine and sidelying to allow gravity assist and stability on the mat. Cont POC.     OBJECTIVE IMPAIRMENTS: decreased activity tolerance, decreased endurance, decreased mobility, decreased ROM, decreased strength, impaired perceived functional ability, impaired flexibility, impaired UE functional use, postural dysfunction, and pain.   ACTIVITY LIMITATIONS: carrying, lifting, bending, sleeping, transfers, bathing, toileting, dressing, self feeding, reach over head, and hygiene/grooming  PARTICIPATION LIMITATIONS: meal prep, cleaning, laundry, driving, shopping, community activity, occupation, and yard work  PERSONAL FACTORS: Time since onset of injury/illness/exacerbation and 3+ comorbidities: anxiety, DM, HTN  are also affecting patient's functional outcome.   REHAB POTENTIAL: Good  CLINICAL DECISION MAKING: Stable/uncomplicated  EVALUATION COMPLEXITY: Low   GOALS: Goals reviewed with patient? No  SHORT TERM GOALS: Target date: 01/06/2023 Pt will demonstrate appropriate understanding and performance of initially prescribed HEP  in order to facilitate improved independence with management of symptoms.  Baseline: HEP provided on eval Goal status:MET   2. Pt will score greater than or equal to 45 on FOTO in order to demonstrate improved perception of function due to symptoms.  Baseline: 29  Goal status: MET  LONG TERM GOALS: Target date: 02/17/2023   Pt will score 64 on FOTO in order to demonstrate improved perception of function due to symptoms. Baseline: 29, 56%  Goal status: ongoing   2.  Pt will demonstrate at least 150 degrees of active shoulder elevation in order to demonstrate improved tolerance to functional movement patterns such as overhead reaching. Baseline: can do this with heavy compensation  Goal status: MET   3.  Pt will demonstrate at least 4+/5 shoulder flex/abduction MMT for improved symmetry of UE strength and improved tolerance to functional movements.  Baseline: deferred on eval given acuity of surgery See above  Goal status: ongoing   4. Pt will report/demonstrate ability to perform upper body dressing with less than 2 point increase in pain on NPS in order to indicate improved tolerance/independence to ADLs.  Baseline: unable to  perform, requiring family assist  Goal status:MET   5. Pt will be able to lift at least 40# from floor to waist with less than 2 pt increase in pain in order to facilitate improved tolerance to work tasks.  Baseline: updated 04/19/23  Goal status: MET   6. Pt will be able to return to work without limitation of pain or functional (driving, pushing and pulling materials 40-50 lbs)   Baseline: able to drive without issues, not lifting on restriction  Goal status: ongoing, is still on light duty  7. Pt will be able to use his Rt UE while driving, reaching with ease   Baseline: difficulty reaching back and to the radio  Goal status : MET   8.  Pt will be able to use his Rt UE while working overhead for 5 min with no difficulty   Baseline:  min to mod difficulty  2 min   Goal status : NEW  PLAN:  PT FREQUENCY: 2x/week  PT DURATION: 8 weeks  PLANNED INTERVENTIONS: 97164- PT Re-evaluation, 97110-Therapeutic exercises, 97530- Therapeutic activity, 97112- Neuromuscular re-education, 97535- Self Care, 29528- Manual therapy, 97014- Electrical stimulation (unattended), 97016- Vasopneumatic device, Patient/Family education, Balance training, Stair training, Taping, Dry Needling, Joint mobilization, Spinal mobilization, Scar mobilization, Cryotherapy, and Moist heat  PLAN FOR NEXT SESSION:    lifting from floor to waist / Upgrade HEP- scapular (try quadruped vs prone)  Progress strength and minimize compensations.  Manual therapy, scapular retraction, posture : STRENGTHEN IN SUPINE AND RECLINED, PROGRESSING TO UPRIGHT.    Marci Setter, PT 05/10/23 8:48 AM Phone: [phone] Fax: [phone]

## 2023-05-10 ENCOUNTER — Ambulatory Visit: Admitting: Physical Therapy

## 2023-05-10 ENCOUNTER — Encounter: Payer: Self-pay | Admitting: Physical Therapy

## 2023-05-10 DIAGNOSIS — M25511 Pain in right shoulder: Secondary | ICD-10-CM

## 2023-05-10 DIAGNOSIS — M25611 Stiffness of right shoulder, not elsewhere classified: Secondary | ICD-10-CM | POA: Diagnosis not present

## 2023-05-10 DIAGNOSIS — M6281 Muscle weakness (generalized): Secondary | ICD-10-CM

## 2023-05-12 ENCOUNTER — Encounter: Admitting: Physical Therapy

## 2023-05-16 NOTE — Therapy (Unsigned)
 OUTPATIENT PHYSICAL THERAPY SHOULDER TREATMENT  Patient Name: [name] [name] MRN: [id number] DOB:[birth year], 48 y.o., male Today's Date: 05/17/2023  END OF SESSION:  PT End of Session - 05/17/23 0809     Visit Number 42    Number of Visits 46    Date for PT Re-Evaluation 06/14/23    Authorization Type [employer]    Authorization Time Period 60VL no auth required    PT Start Time 0800    PT Stop Time 0844    PT Time Calculation (min) 44 min    Activity Tolerance Patient tolerated treatment well    Behavior During Therapy WFL for tasks assessed/performed                 Past Medical History:  Diagnosis Date   Anxiety    Diabetes mellitus without complication (HCC)    GERD (gastroesophageal reflux disease)    Hypertension    Obesity (BMI 30-39.9)    Plantar fasciitis of right foot    Skin lesion    Tendonitis    Traumatic tear of left rotator cuff 05/20/2016   Past Surgical History:  Procedure Laterality Date   SHOULDER ACROMIOPLASTY Right 11/23/2022   Procedure: RIGHT SHOULDER ACROMIOPLASTY;  Surgeon: Wilhelmenia Harada, MD;  Location: [facility];  Service: Orthopedics;  Laterality: Right;   SHOULDER ARTHROSCOPY WITH ROTATOR CUFF REPAIR AND SUBACROMIAL DECOMPRESSION Left 05/21/2016   Procedure: SHOULDER ARTHROSCOPY WITH ROTATOR CUFF REPAIR AND SUBACROMIAL DECOMPRESSION LABRAL DEBRIDEMENT;  Surgeon: Elly Habermann, MD;  Location:  SURGERY CENTER;  Service: Orthopedics;  Laterality: Left;   VASECTOMY     Patient Active Problem List   Diagnosis Date Noted   Traumatic complete tear of right rotator cuff 11/23/2022   Lab test positive for detection of COVID-19 virus 02/23/2022   Paresthesia 10/23/2018   Traumatic tear of left rotator cuff 05/20/2016   Family history of hypertrophic cardiomyopathy Aug 20, 2014   Family history of sudden cardiac death in son 08/20/2014    PCP: Ronna Coho, MD  REFERRING PROVIDER: Wilhelmenia Harada, MD  REFERRING  DIAG: [phone] (ICD-10-CM) - Traumatic complete tear of right rotator cuff, initial encounter  THERAPY DIAG:  Stiffness of right shoulder, not elsewhere classified  Right shoulder pain, unspecified chronicity  Muscle weakness (generalized)  Rationale for Evaluation and Treatment: Rehabilitation  ONSET DATE: 11/23/22 rotator cuff repair, superior capsular reconstruction, acromioplasty  SUBJECTIVE:                                                                                                                                                                                      SUBJECTIVE STATEMENT: Pt continues to do well, has some  mild nerve-type pain in it occasionally along incision.  Tingling.   EVAL: In August pt was working on a ladder, fell backwards but unsure quite how he landed. States he did have a mild concussion but that has since resolved. Previously independent and active for work. Since surgery on 11/23/22, has been receiving assistance from wife and kids for daily tasks. Sleeping in recliner with sling and pillow under arm. Icing 3-4x/day ~51min.  Hand dominance: Right  PERTINENT HISTORY: BIL traumatic RC tears, anxiety, DM, GERD, HTN H/O Lt RCR 2018  Recent note : MD  01/05/23: 6-week status post right shoulder superior capsular reconstruction overall doing very well.  At this time I discussed I would like him to continue through the active range of motion protocol and to work on overhead active range of motion.  Overall he is doing quite well for this stage.  I will plan to see him back in 6 weeks for reassessment   PAIN:  Are you having pain: none/10 today  Location/description: occasional burning and popping; R shoulder, superiorly Best-worst over past week: 0-7/10  - aggravating factors: sleeping and mornings,  - Easing factors: sling, medication    PRECAUTIONS: s/p R RCR 11/23/22; using Dr. Hermina Loosen rotator cuff repair protocol per op note   WEIGHT BEARING  RESTRICTIONS: Yes NWB surgical limb  FALLS:  Has patient fallen in last 6 months? Yes. 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 Lifts and carries up to ~50#  PLOF: Independent  PATIENT GOALS: get back to playing golf, shooting pool  NEXT MD VISIT: Nov 18th , Feb 12 , may ?  OBJECTIVE:  Note: Objective measures were completed at Evaluation unless otherwise noted.  DIAGNOSTIC FINDINGS:  S/p shoulder surgery 11/23/22 Panel 1: Right RIGHT SHOULDER ARTHROSCOPY WITH SUPERIOR CAPSULAR RECONSTRUCTION / EXTENSIVE DEBRIDEMENT with Wilhelmenia Harada, MD  Panel 1: Right RIGHT SHOULDER ACROMIOPLASTY with Wilhelmenia Harada, MD   PATIENT SURVEYS:  FOTO 29>64 predicted Visit 8 , 12/23/22 : 56%  Visit 16: 01/27/23: NA Visit 23: 02/22/23: 58%:  Visit 38% 04/19/23: 75% DC   COGNITION: Overall cognitive status: Within functional limits for tasks assessed     SENSATION/OBSERVATION: Surgical site obscured by bandaging Pt denies any sensory complaints since nerve block wore off  POSTURE: Sling RUE, increased R UT elevation  UPPER EXTREMITY ROM:  ROM Rt. 02/22/23 Rt.  03/10/23 RT 04/12/23 Rt 04/19/23 Rt  05/02/23 Rt.  05/10/23  Shoulder flexion  153 155 heavy comp 155 Min comp 164 165 168  Shoulder abduction 100    165  168  Shoulder internal rotation Thumb at T12 hand to lumbar  At 90 deg abd supine 80  T8 functional reach T9 FR   Shoulder external rotation Fingers to T1 At 90 deg abd supine 65  T1 Functional reach  T1 FR   Elbow flexion        Elbow extension        Wrist flexion        Wrist extension         (Blank rows = not tested) (Key: WFL = within  functional limits not formally assessed, * = concordant pain, s = stiffness/stretching sensation, NT = not tested)  Comments: deferred given acuity of surgery, phase 1 on eval, elbow flex/ext AAROM grossly WNL  UPPER EXTREMITY MMT:  MMT Right eval R 12/27/22  Rt. 01/27/23 Rt. 02/22/23 Rt 03/08/23 Rt.  04/19/23  Shoulder flexion   3-/5 3/5 3/5 3+/5  Shoulder extension        Shoulder abduction   3-/5 3/5 in available range 4/5 4-/5  Shoulder extension        Shoulder internal rotation  5 5/5 5/5  5/5  Shoulder external rotation  4- 4/5 4/5  4/5 4-/5 in 90 deg abd  Elbow flexion        Elbow extension        Grip strength        (Blank rows = not tested)  (Key: WFL = within functional limits not formally assessed, * = concordant pain, s = stiffness/stretching sensation, NT = not tested) Comments: deferred given acuity of surgery  SHOULDER SPECIAL TESTS: Deferred given acuity of surgery  JOINT MOBILITY TESTING:  Deferred given acuity of surgery  [facility] Adult PT Treatment:                                                DATE: 05/17/23 Therapeutic Exercise: UBE reverse L5 for 5 min  Row 15 lbs, 25 lbs x 12-15 each  Upright row 25 lbs x 12 Overhead reaching (unable to do 5 lbs), taken to the wall with 2 lbs, slide with lift off  Sidelying ER: 5 lbs x 12, 3 lbs x 12  Reverse fly: 4 lbs x 10 x 2 sets  Scaption 4 lbs x 10 x 2  Standing long lever strength Rt UE , 3-4 lbs x 10 x 2 and lateral raise as well  Walking with 2 lbs overhead 2 x 75 lbs  Walking 180 feet with 5 lbs in front with bent elbow Quadruped scap protract/retract double and single arm Monster plank x 30 sec  PROM all planes with contract relax for IR    [facility] Adult PT Treatment:                                                DATE: 05/10/23  UBE level 3, 3 min each direction  Supine and sidelying: strengthening with dumbbells Flexion 4 lbs (bent elbow and elbow extended)  5 lbs narrow press x 10 x 2 sets  Skull crusher  5 lbs x 15 Circles (small) 5 lbs x 20  Sidelying flexion 3 lbs x 15  Abduction, scaption x 4 lbs x 10 x 2  ER 3 lbs x  15 , 4 lbs x 10  Standing diagonal (red band) heavy comps Single arm pull  Bilateral black upright row , bicep curl x 15 each standing  Green looped band x 15 against the wall Serratus lift Flexion with green looped band elbows extended x 2 x 8 Dowel mobility: extension, IR and abduction x 10 to tolerance   [facility] Adult PT Treatment:  DATE: 05/02/23 Therapeutic Exercise: UBE level 3 , 3 min each direction  Row narrow and wide  ER in abduction  Elevation red band x 15  Looped blue band press out in neutral  Added overhead lift x 10 full ROM  Closed chain shoulder work at wall- flexion difficulty CCK in Quadruped: flexion, abduction 2-3 lbs 2 x 10  Low plank on BOSU mat table 45 sec added small range mtn. Climber  High plank on high mat alternating arm lift  Supine flexion 3 lbs x 15, 4 lbs x 10 and 5 lbs x 10  Skull crusher 5 lbs x 15 Rt UE biceps with ER/IR x 15   [facility] Adult PT Treatment:                                                DATE: 04/26/23 Therapeutic Exercise: RT UE only L 5-9 for 4 min  Therapeutic Activity: Springboard/Slastix: ER and IR with shoulder flexion Shoulder elevation slastix  Shoulder row and extension with roll back bar  Supine GTB chest press added scapular retract/protract Supine red long band flexion and scaption  Supine Rt shoulder narrow press 2 lbs 2 x 10  Skull crusher 4 lbs x 20  Sidelying ER  3 # 2 x 10 Sidelying Abduction (elbow flexed) 2 x 10  Standing wall rebounding with tennis ball and small green ball overhead, abduction Bilateral UE ball overhead with alt UE lift in full flexion.  Double arm "clean" to chest with 25 lbs KB Single arm clean 15 lbs  Bent over Row x 15, 15 lbs , x 10 with 25 lbs   [facility] Adult PT Treatment:                                                DATE:  04/19/23 Therapeutic Exercise: NuStep UE only level 8, 6 min  Therapeutic Activity: MMT, ROM measurement  Row 4 plates x 20  ER 1 plate x 15 Flexion 3 x 10, 1 plate  30 sec x 3 Rt UE 5 lbs overhead hold static standing  Straight arm pull down slastix  Lower trap activation using wall and foam roller  Shoulder flexion AROM Rt UE at wall unable to get full ROM due to fatigue   04/14/23:  Therapeutic Activity: UBE Level 3 x 5 min Pulleys  Standing shoulder flexion AROM x 10 (failure)  Right shouler abduction x 2 (failure) Standing cane AA shoulder abduction with eccentric lowering x 10  Attempted belt to depress shoulder with AROM Right shoulder Row cable 23# 15 x 2  13# right pulldown right 20 x 1 ER Blue 10 x 2  Standing horiz abdct Blue band attached  Reclined: right PNF x 15 Reclined 2# shoulder flexion x 10, 3# x 10 Side shoulder abdct 4# 2 x 10  Side ER 3# 10 x2 Prone scap retract + ext 10 x 2  Prone horiz abdct thumbs up Prone Right ER from 90 degrees 10 x 2 2# thumb in and thumb up Modalities: Ice pack post session to decrease soreness  PATIENT EDUCATION: Education details: Pt education on PT impairments, prognosis, and POC. Informed consent. Rationale for interventions, safe/appropriate HEP performance Person educated: Patient  Education method: Explanation, Demonstration, Tactile cues, Verbal cues, and Handouts Education comprehension: verbalized understanding, returned demonstration, verbal cues required, tactile cues required, and needs further education    HOME EXERCISE PROGRAM:  Access Code: [email or web address] URL: [email or web address] Date: 01/14/2023 Prepared by: Marci Setter  Program Notes - with seated elbow flexion, work on gently straightening and bending the elbow with the support of the other arm  Exercises - Standing Elbow Flexion with Resistance  - 1 x daily - 7 x weekly - 2 sets - 10 reps - 5 hold - Standing Shoulder External Rotation  Stretch in Doorway  - 1 x daily - 7 x weekly - 2 sets - 10 reps - 10-20 hold - Standing Shoulder Abduction AAROM with Dowel  - 1 x daily - 7 x weekly - 2 sets - 10 reps - 10 hold - Standing Shoulder Extension with Dowel  - 1 x daily - 7 x weekly - 2 sets - 10 reps - 10 hold - Standing Shoulder Flexion AAROM with Dowel  - 1 x daily - 7 x weekly - 2 sets - 10 reps - 10 hold - Standing Shoulder Internal Rotation AAROM with Dowel  - 1 x daily - 7 x weekly - 2 sets - 10 reps - 10 hold - Corner Pec Major Stretch  - 1-2 x daily - 7 x weekly - 1 sets - 5 reps - 30 hold - Standing Lat Pull Down with Resistance - Elbows Bent  - 1 x daily - 7 x weekly - 2 sets - 10 reps - 5 hold - Sidelying Shoulder ER with Towel and Dumbbell  - 1 x daily - 7 x weekly - 2 sets - 10 reps - 2 hold - Sidelying Shoulder Horizontal Abduction  - 1 x daily - 7 x weekly - 2 sets - 10 reps - 2 hold - Sidelying Shoulder Scaption  - 1 x daily - 7 x weekly - 2 sets - 10 reps - 2 hold    ASSESSMENT:  CLINICAL IMPRESSION:    Patient overall with improved shoulder function.  He does have some discomfort with tossing heavier items (work) so he does try to avoid this. He needs A to get to full overhead position but once he is there he can maintain it with 2 lbs overhead.  Pt continues to compensate and lacks scapular retraction/downward rotation and goes directly into elevation. Cont POC about 2 more visits.    Patient shows decreased compensation in right upper extremity with overhead reaching and both supine and standing.  He demonstrates the most limitations in standing diagonal reaching, combining abduction and external rotation.  He has no pain during the session.  He is back to near full function but does still exhibit weakness in abduction and flexion. He will continue therapy for about 3-4 more weeks he sees his surgeon at that time and will likely be discharged from therapy.  Patient continues to work on the goal of normalized  overhead reaching while minimizing trunk compensation.  He was able to work in supine and sidelying to allow gravity assist and stability on the mat. Cont POC.     OBJECTIVE IMPAIRMENTS: decreased activity tolerance, decreased endurance, decreased mobility, decreased ROM, decreased strength, impaired perceived functional ability, impaired flexibility, impaired UE functional use, postural dysfunction, and pain.   ACTIVITY LIMITATIONS: carrying, lifting, bending, sleeping, transfers, bathing, toileting, dressing, self feeding, reach over head, and hygiene/grooming  PARTICIPATION LIMITATIONS: meal prep, cleaning, laundry, driving, shopping, community activity,  occupation, and yard work  PERSONAL FACTORS: Time since onset of injury/illness/exacerbation and 3+ comorbidities: anxiety, DM, HTN  are also affecting patient's functional outcome.   REHAB POTENTIAL: Good  CLINICAL DECISION MAKING: Stable/uncomplicated  EVALUATION COMPLEXITY: Low   GOALS: Goals reviewed with patient? No  SHORT TERM GOALS: Target date: 01/06/2023 Pt will demonstrate appropriate understanding and performance of initially prescribed HEP in order to facilitate improved independence with management of symptoms.  Baseline: HEP provided on eval Goal status:MET   2. Pt will score greater than or equal to 45 on FOTO in order to demonstrate improved perception of function due to symptoms.  Baseline: 29  Goal status: MET  LONG TERM GOALS: Target date: 02/17/2023   Pt will score 64 on FOTO in order to demonstrate improved perception of function due to symptoms. Baseline: 29, 56%  Goal status: ongoing   2.  Pt will demonstrate at least 150 degrees of active shoulder elevation in order to demonstrate improved tolerance to functional movement patterns such as overhead reaching. Baseline: can do this with heavy compensation  Goal status: MET   3.  Pt will demonstrate at least 4+/5 shoulder flex/abduction MMT for improved  symmetry of UE strength and improved tolerance to functional movements.  Baseline: deferred on eval given acuity of surgery See above  Goal status: ongoing   4. Pt will report/demonstrate ability to perform upper body dressing with less than 2 point increase in pain on NPS in order to indicate improved tolerance/independence to ADLs.  Baseline: unable to perform, requiring family assist  Goal status:MET   5. Pt will be able to lift at least 40# from floor to waist with less than 2 pt increase in pain in order to facilitate improved tolerance to work tasks.  Baseline: updated 04/19/23  Goal status: MET   6. Pt will be able to return to work without limitation of pain or functional (driving, pushing and pulling materials 40-50 lbs)   Baseline: able to drive without issues, not lifting on restriction  Goal status: ongoing, is still on light duty  7. Pt will be able to use his Rt UE while driving, reaching with ease   Baseline: difficulty reaching back and to the radio  Goal status : MET   8.  Pt will be able to use his Rt UE while working overhead for 5 min with no difficulty   Baseline:  min to mod difficulty 2 min   Goal status : NEW  PLAN:  PT FREQUENCY: 2x/week  PT DURATION: 8 weeks  PLANNED INTERVENTIONS: 97164- PT Re-evaluation, 97110-Therapeutic exercises, 97530- Therapeutic activity, 97112- Neuromuscular re-education, 97535- Self Care, 81191- Manual therapy, 97014- Electrical stimulation (unattended), 97016- Vasopneumatic device, Patient/Family education, Balance training, Stair training, Taping, Dry Needling, Joint mobilization, Spinal mobilization, Scar mobilization, Cryotherapy, and Moist heat  PLAN FOR NEXT SESSION:    lifting from floor to waist / Upgrade HEP- scapular (try quadruped vs prone)  Progress strength and minimize compensations.  Manual therapy, scapular retraction, posture : STRENGTHEN IN SUPINE AND RECLINED, PROGRESSING TO UPRIGHT.    Marci Setter,  PT 05/17/23 11:20 AM Phone: [phone] Fax: [phone]

## 2023-05-17 ENCOUNTER — Encounter: Payer: Self-pay | Admitting: Physical Therapy

## 2023-05-17 ENCOUNTER — Ambulatory Visit: Admitting: Physical Therapy

## 2023-05-17 DIAGNOSIS — M25611 Stiffness of right shoulder, not elsewhere classified: Secondary | ICD-10-CM | POA: Diagnosis not present

## 2023-05-17 DIAGNOSIS — M25511 Pain in right shoulder: Secondary | ICD-10-CM

## 2023-05-17 DIAGNOSIS — M6281 Muscle weakness (generalized): Secondary | ICD-10-CM

## 2023-05-24 ENCOUNTER — Encounter: Payer: Self-pay | Admitting: Physical Therapy

## 2023-05-24 ENCOUNTER — Ambulatory Visit: Attending: Orthopaedic Surgery | Admitting: Physical Therapy

## 2023-05-24 DIAGNOSIS — M25511 Pain in right shoulder: Secondary | ICD-10-CM | POA: Diagnosis present

## 2023-05-24 DIAGNOSIS — M25611 Stiffness of right shoulder, not elsewhere classified: Secondary | ICD-10-CM | POA: Insufficient documentation

## 2023-05-24 DIAGNOSIS — M6281 Muscle weakness (generalized): Secondary | ICD-10-CM | POA: Insufficient documentation

## 2023-05-24 NOTE — Therapy (Signed)
 OUTPATIENT PHYSICAL THERAPY SHOULDER TREATMENT  Patient Name: [name] [name] MRN: [id number] DOB:August 06, 1975, 48 y.o., male Today's Date: 05/24/2023  END OF SESSION:  PT End of Session - 05/24/23 0811     Visit Number 43    Number of Visits 46    Date for PT Re-Evaluation 06/14/23    Authorization Type [employer]    Authorization Time Period 60VL no auth required    Progress Note Due on Visit 20    PT Start Time 0805    PT Stop Time 0845    PT Time Calculation (min) 40 min                 Past Medical History:  Diagnosis Date   Anxiety    Diabetes mellitus without complication (HCC)    GERD (gastroesophageal reflux disease)    Hypertension    Obesity (BMI 30-39.9)    Plantar fasciitis of right foot    Skin lesion    Tendonitis    Traumatic tear of left rotator cuff 05/20/2016   Past Surgical History:  Procedure Laterality Date   SHOULDER ACROMIOPLASTY Right 11/23/2022   Procedure: RIGHT SHOULDER ACROMIOPLASTY;  Surgeon: Wilhelmenia Harada, MD;  Location: [facility];  Service: Orthopedics;  Laterality: Right;   SHOULDER ARTHROSCOPY WITH ROTATOR CUFF REPAIR AND SUBACROMIAL DECOMPRESSION Left 05/21/2016   Procedure: SHOULDER ARTHROSCOPY WITH ROTATOR CUFF REPAIR AND SUBACROMIAL DECOMPRESSION LABRAL DEBRIDEMENT;  Surgeon: Elly Habermann, MD;  Location: [facility];  Service: Orthopedics;  Laterality: Left;   VASECTOMY     Patient Active Problem List   Diagnosis Date Noted   Traumatic complete tear of right rotator cuff 11/23/2022   Lab test positive for detection of COVID-19 virus 02/23/2022   Paresthesia 10/23/2018   Traumatic tear of left rotator cuff 05/20/2016   Family history of hypertrophic cardiomyopathy 08/16/2014   Family history of sudden cardiac death in son 08-16-14    PCP: Ronna Coho, MD  REFERRING PROVIDER: Wilhelmenia Harada, MD  REFERRING DIAG: [phone] (ICD-10-CM) - Traumatic complete tear of right rotator cuff, initial  encounter  THERAPY DIAG:  Stiffness of right shoulder, not elsewhere classified  Right shoulder pain, unspecified chronicity  Muscle weakness (generalized)  Rationale for Evaluation and Treatment: Rehabilitation  ONSET DATE: 11/23/22 rotator cuff repair, superior capsular reconstruction, acromioplasty  SUBJECTIVE:                                                                                                                                                                                      SUBJECTIVE STATEMENT: Pt continues to do well, has some mild nerve-type pain in it occasionally along incision.  Tingling.  EVAL: In August pt was working on a ladder, fell backwards but unsure quite how he landed. States he did have a mild concussion but that has since resolved. Previously independent and active for work. Since surgery on 11/23/22, has been receiving assistance from wife and kids for daily tasks. Sleeping in recliner with sling and pillow under arm. Icing 3-4x/day ~24min.  Hand dominance: Right  PERTINENT HISTORY: BIL traumatic RC tears, anxiety, DM, GERD, HTN H/O Lt RCR 2018  Recent note : MD  01/05/23: 6-week status post right shoulder superior capsular reconstruction overall doing very well.  At this time I discussed I would like him to continue through the active range of motion protocol and to work on overhead active range of motion.  Overall he is doing quite well for this stage.  I will plan to see him back in 6 weeks for reassessment   PAIN:  Are you having pain: none/10 today  Location/description: occasional burning and popping; R shoulder, superiorly Best-worst over past week: 0-7/10  - aggravating factors: sleeping and mornings,  - Easing factors: sling, medication    PRECAUTIONS: s/p R RCR 11/23/22; using Dr. Hermina Loosen rotator cuff repair protocol per op note   WEIGHT BEARING RESTRICTIONS: Yes NWB surgical limb  FALLS:  Has patient fallen in last 6 months? Yes.  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 Lifts and carries up to ~50#  PLOF: Independent  PATIENT GOALS: get back to playing golf, shooting pool  NEXT MD VISIT: Nov 18th , Feb 12 , may ?  OBJECTIVE:  Note: Objective measures were completed at Evaluation unless otherwise noted.  DIAGNOSTIC FINDINGS:  S/p shoulder surgery 11/23/22 Panel 1: Right RIGHT SHOULDER ARTHROSCOPY WITH SUPERIOR CAPSULAR RECONSTRUCTION / EXTENSIVE DEBRIDEMENT with Wilhelmenia Harada, MD  Panel 1: Right RIGHT SHOULDER ACROMIOPLASTY with Wilhelmenia Harada, MD   PATIENT SURVEYS:  FOTO 29>64 predicted Visit 8 , 12/23/22 : 56%  Visit 16: 01/27/23: NA Visit 23: 02/22/23: 58%:  Visit 38% 04/19/23: 75% DC   COGNITION: Overall cognitive status: Within functional limits for tasks assessed     SENSATION/OBSERVATION: Surgical site obscured by bandaging Pt denies any sensory complaints since nerve block wore off  POSTURE: Sling RUE, increased R UT elevation  UPPER EXTREMITY ROM:  ROM Rt. 02/22/23 Rt.  03/10/23 RT 04/12/23 Rt 04/19/23 Rt  05/02/23 Rt.  05/10/23  Shoulder flexion  153 155 heavy comp 155 Min comp 164 165 168  Shoulder abduction 100    165  168  Shoulder internal rotation Thumb at T12 hand to lumbar  At 90 deg abd supine 80  T8 functional reach T9 FR   Shoulder external rotation Fingers to T1 At 90 deg abd supine 65  T1 Functional reach  T1 FR   Elbow flexion        Elbow extension        Wrist flexion        Wrist extension         (Blank rows = not tested) (Key: WFL = within functional limits not formally assessed, * = concordant pain, s = stiffness/stretching  sensation, NT = not tested)  Comments: deferred given acuity of surgery, phase 1 on eval, elbow flex/ext  AAROM grossly WNL  UPPER EXTREMITY MMT:  MMT Right eval R 12/27/22  Rt. 01/27/23 Rt. 02/22/23 Rt 03/08/23 Rt.  04/19/23  Shoulder flexion   3-/5 3/5 3/5 3+/5  Shoulder extension        Shoulder abduction   3-/5 3/5 in available range 4/5 4-/5  Shoulder extension        Shoulder internal rotation  5 5/5 5/5  5/5  Shoulder external rotation  4- 4/5 4/5  4/5 4-/5 in 90 deg abd  Elbow flexion        Elbow extension        Grip strength        (Blank rows = not tested)  (Key: WFL = within functional limits not formally assessed, * = concordant pain, s = stiffness/stretching sensation, NT = not tested) Comments: deferred given acuity of surgery  SHOULDER SPECIAL TESTS: Deferred given acuity of surgery  JOINT MOBILITY TESTING:  Deferred given acuity of surgery  [facility] Adult PT Treatment:                                                DATE: 05/24/23 Therapeutic Exercise: Seated row single arm 25# 15 x 2  Seated chest press single arm 10# , 15#  x 15 each  Seated tricep press 20# 10 x 2  OH press 10# using both arms 10 x 2  Standing D2 Flex AROM x 10 Standing long lever strength Rt UE , 3-4 lbs x 10 x 2 and lateral raise as well  Inclined horiz abdct blue 10 x 2  Inclined D2 Flex 10 x 2  Inclined shoulder flexion long lever 4# 10 x 2  S/L shoulder abduction 4# 10 x 2  Standing wall wash OH 2# on wrist ,  1 minute and 15 sec tolerated prior to fatigue Wall push up 10 x 2  PROM right shoulder     [facility] Adult PT Treatment:                                                DATE: 05/17/23 Therapeutic Exercise: UBE reverse L5 for 5 min  Row 15 lbs, 25 lbs x 12-15 each  Upright row 25 lbs x 12 Overhead reaching (unable to do 5 lbs), taken to the wall with 2 lbs, slide with lift off  Sidelying ER: 5 lbs x 12, 3 lbs x 12  Reverse fly: 4 lbs x 10 x 2 sets  Scaption 4 lbs x 10 x 2  Standing  long lever strength Rt UE , 3-4 lbs x 10 x 2 and lateral raise as well  Walking with 2 lbs overhead 2 x 75 lbs  Walking 180 feet with 5 lbs in front with bent elbow Quadruped scap protract/retract double and single arm Monster plank x 30 sec  PROM all planes with contract relax for IR    [facility] Adult PT Treatment:                                                DATE: 05/10/23  UBE level 3,  3 min each direction  Supine and sidelying: strengthening with dumbbells Flexion 4 lbs (bent elbow and elbow extended)  5 lbs narrow press x 10 x 2 sets  Skull crusher 5 lbs x 15 Circles (small) 5 lbs x 20  Sidelying flexion 3 lbs x 15  Abduction, scaption x 4 lbs x 10 x 2  ER 3 lbs x  15 , 4 lbs x 10  Standing diagonal (red band) heavy comps Single arm pull  Bilateral black upright row , bicep curl x 15 each standing  Green looped band x 15 against the wall Serratus lift Flexion with green looped band elbows extended x 2 x 8 Dowel mobility: extension, IR and abduction x 10 to tolerance   [facility] Adult PT Treatment:                                                DATE: 05/02/23 Therapeutic Exercise: UBE level 3 , 3 min each direction  Row narrow and wide  ER in abduction  Elevation red band x 15  Looped blue band press out in neutral  Added overhead lift x 10 full ROM  Closed chain shoulder work at wall- flexion difficulty CCK in Quadruped: flexion, abduction 2-3 lbs 2 x 10  Low plank on BOSU mat table 45 sec added small range mtn. Climber  High plank on high mat alternating arm lift  Supine flexion 3 lbs x 15, 4 lbs x 10 and 5 lbs x 10  Skull crusher 5 lbs x 15 Rt UE biceps with ER/IR x 15   [facility] Adult PT Treatment:                                                DATE: 04/26/23 Therapeutic Exercise: RT UE only L 5-9 for 4 min  Therapeutic Activity: Springboard/Slastix: ER and IR with shoulder flexion Shoulder elevation slastix  Shoulder row and extension with roll back bar  Supine GTB chest  press added scapular retract/protract Supine red long band flexion and scaption  Supine Rt shoulder narrow press 2 lbs 2 x 10  Skull crusher 4 lbs x 20  Sidelying ER  3 # 2 x 10 Sidelying Abduction (elbow flexed) 2 x 10  Standing wall rebounding with tennis ball and small green ball overhead, abduction Bilateral UE ball overhead with alt UE lift in full flexion.  Double arm "clean" to chest with 25 lbs KB Single arm clean 15 lbs  Bent over Row x 15, 15 lbs , x 10 with 25 lbs   [facility] Adult PT Treatment:                                                DATE: 04/19/23 Therapeutic Exercise: NuStep UE only level 8, 6 min  Therapeutic Activity: MMT, ROM measurement  Row 4 plates x 20  ER 1 plate x 15 Flexion 3 x 10, 1 plate  30 sec x 3 Rt UE 5 lbs overhead hold static standing  Straight arm pull down slastix  Lower trap activation using wall and foam roller  Shoulder flexion AROM Rt UE at wall unable to get full ROM due to fatigue   04/14/23:  Therapeutic Activity: UBE Level 3 x 5 min Pulleys  Standing shoulder flexion AROM x 10 (failure)  Right shouler abduction x 2 (failure) Standing cane AA shoulder abduction with eccentric lowering x 10  Attempted belt to depress shoulder with AROM Right shoulder Row cable 23# 15 x 2  13# right pulldown right 20 x 1 ER Blue 10 x 2  Standing horiz abdct Blue band attached  Reclined: right PNF x 15 Reclined 2# shoulder flexion x 10, 3# x 10 Side shoulder abdct 4# 2 x 10  Side ER 3# 10 x2 Prone scap retract + ext 10 x 2  Prone horiz abdct thumbs up Prone Right ER from 90 degrees 10 x 2 2# thumb in and thumb up Modalities: Ice pack post session to decrease soreness  PATIENT EDUCATION: Education details: Pt education on PT impairments, prognosis, and POC. Informed consent. Rationale for interventions, safe/appropriate HEP performance Person educated: Patient Education method: Explanation, Demonstration, Tactile cues, Verbal cues, and  Handouts Education comprehension: verbalized understanding, returned demonstration, verbal cues required, tactile cues required, and needs further education    HOME EXERCISE PROGRAM:  Access Code: [email or web address] URL: [email or web address] Date: 01/14/2023 Prepared by: Marci Setter  Program Notes - with seated elbow flexion, work on gently straightening and bending the elbow with the support of the other arm  Exercises - Standing Elbow Flexion with Resistance  - 1 x daily - 7 x weekly - 2 sets - 10 reps - 5 hold - Standing Shoulder External Rotation Stretch in Doorway  - 1 x daily - 7 x weekly - 2 sets - 10 reps - 10-20 hold - Standing Shoulder Abduction AAROM with Dowel  - 1 x daily - 7 x weekly - 2 sets - 10 reps - 10 hold - Standing Shoulder Extension with Dowel  - 1 x daily - 7 x weekly - 2 sets - 10 reps - 10 hold - Standing Shoulder Flexion AAROM with Dowel  - 1 x daily - 7 x weekly - 2 sets - 10 reps - 10 hold - Standing Shoulder Internal Rotation AAROM with Dowel  - 1 x daily - 7 x weekly - 2 sets - 10 reps - 10 hold - Corner Pec Major Stretch  - 1-2 x daily - 7 x weekly - 1 sets - 5 reps - 30 hold - Standing Lat Pull Down with Resistance - Elbows Bent  - 1 x daily - 7 x weekly - 2 sets - 10 reps - 5 hold - Sidelying Shoulder ER with Towel and Dumbbell  - 1 x daily - 7 x weekly - 2 sets - 10 reps - 2 hold - Sidelying Shoulder Horizontal Abduction  - 1 x daily - 7 x weekly - 2 sets - 10 reps - 2 hold - Sidelying Shoulder Scaption  - 1 x daily - 7 x weekly - 2 sets - 10 reps - 2 hold    ASSESSMENT:  CLINICAL IMPRESSION:   Continued with shoulder AAROM, AROM and strengthening mostly in gravity minimized positions due to weakness and compensations. One more visit until MD visit.    05/17/23: Patient overall with improved shoulder function.  He does have some discomfort with tossing heavier items (work) so he does try to avoid this. He needs A to get to full overhead  position but once he is there he can maintain it with  2 lbs overhead.  Pt continues to compensate and lacks scapular retraction/downward rotation and goes directly into elevation. Cont POC about 2 more visits.         OBJECTIVE IMPAIRMENTS: decreased activity tolerance, decreased endurance, decreased mobility, decreased ROM, decreased strength, impaired perceived functional ability, impaired flexibility, impaired UE functional use, postural dysfunction, and pain.   ACTIVITY LIMITATIONS: carrying, lifting, bending, sleeping, transfers, bathing, toileting, dressing, self feeding, reach over head, and hygiene/grooming  PARTICIPATION LIMITATIONS: meal prep, cleaning, laundry, driving, shopping, community activity, occupation, and yard work  PERSONAL FACTORS: Time since onset of injury/illness/exacerbation and 3+ comorbidities: anxiety, DM, HTN  are also affecting patient's functional outcome.   REHAB POTENTIAL: Good  CLINICAL DECISION MAKING: Stable/uncomplicated  EVALUATION COMPLEXITY: Low   GOALS: Goals reviewed with patient? No  SHORT TERM GOALS: Target date: 01/06/2023 Pt will demonstrate appropriate understanding and performance of initially prescribed HEP in order to facilitate improved independence with management of symptoms.  Baseline: HEP provided on eval Goal status:MET   2. Pt will score greater than or equal to 45 on FOTO in order to demonstrate improved perception of function due to symptoms.  Baseline: 29  Goal status: MET  LONG TERM GOALS: Target date: 02/17/2023   Pt will score 64 on FOTO in order to demonstrate improved perception of function due to symptoms. Baseline: 29, 56%  Goal status: ongoing   2.  Pt will demonstrate at least 150 degrees of active shoulder elevation in order to demonstrate improved tolerance to functional movement patterns such as overhead reaching. Baseline: can do this with heavy compensation  Goal status: MET   3.  Pt will demonstrate  at least 4+/5 shoulder flex/abduction MMT for improved symmetry of UE strength and improved tolerance to functional movements.  Baseline: deferred on eval given acuity of surgery See above  Goal status: ongoing   4. Pt will report/demonstrate ability to perform upper body dressing with less than 2 point increase in pain on NPS in order to indicate improved tolerance/independence to ADLs.  Baseline: unable to perform, requiring family assist  Goal status:MET   5. Pt will be able to lift at least 40# from floor to waist with less than 2 pt increase in pain in order to facilitate improved tolerance to work tasks.  Baseline: updated 04/19/23  Goal status: MET   6. Pt will be able to return to work without limitation of pain or functional (driving, pushing and pulling materials 40-50 lbs)   Baseline: able to drive without issues, not lifting on restriction  Goal status: ongoing, is still on light duty  7. Pt will be able to use his Rt UE while driving, reaching with ease   Baseline: difficulty reaching back and to the radio  Goal status : MET   8.  Pt will be able to use his Rt UE while working overhead for 5 min with no difficulty   Baseline:  min to mod difficulty 2 min   Goal status : NEW  PLAN:  PT FREQUENCY: 2x/week  PT DURATION: 8 weeks  PLANNED INTERVENTIONS: 97164- PT Re-evaluation, 97110-Therapeutic exercises, 97530- Therapeutic activity, 97112- Neuromuscular re-education, 97535- Self Care, 16109- Manual therapy, 97014- Electrical stimulation (unattended), 97016- Vasopneumatic device, Patient/Family education, Balance training, Stair training, Taping, Dry Needling, Joint mobilization, Spinal mobilization, Scar mobilization, Cryotherapy, and Moist heat  PLAN FOR NEXT SESSION:    lifting from floor to waist / Upgrade HEP- scapular (try quadruped vs prone)  Progress strength and minimize compensations.  Manual  therapy, scapular retraction, posture : STRENGTHEN IN SUPINE AND  RECLINED, PROGRESSING TO UPRIGHT.    Marci Setter, PT 05/24/23 8:59 AM Phone: [phone] Fax: [phone]

## 2023-05-30 ENCOUNTER — Encounter (HOSPITAL_BASED_OUTPATIENT_CLINIC_OR_DEPARTMENT_OTHER): Payer: 59 | Admitting: Orthopaedic Surgery

## 2023-05-31 ENCOUNTER — Ambulatory Visit: Admitting: Physical Therapy

## 2023-05-31 ENCOUNTER — Encounter: Payer: Self-pay | Admitting: Physical Therapy

## 2023-05-31 DIAGNOSIS — M25511 Pain in right shoulder: Secondary | ICD-10-CM

## 2023-05-31 DIAGNOSIS — M25611 Stiffness of right shoulder, not elsewhere classified: Secondary | ICD-10-CM | POA: Diagnosis not present

## 2023-05-31 DIAGNOSIS — M6281 Muscle weakness (generalized): Secondary | ICD-10-CM

## 2023-05-31 NOTE — Therapy (Signed)
 OUTPATIENT PHYSICAL THERAPY SHOULDER TREATMENT  Patient Name: [name] [name] MRN: [id number] DOB:October 26, 1975, 48 y.o., male Today's Date: 05/31/2023   PHYSICAL THERAPY DISCHARGE SUMMARY  Visits from Start of Care: 44  Current functional level related to goals / functional outcomes: See below    Remaining deficits: Weakness, muscle endurance    Education / Equipment: HEP, posture, lifting, compensations    Patient agrees to discharge. Patient goals were met. Patient is being discharged due to being pleased with the current functional level.    Marci Setter, PT 05/31/23 8:49 AM Phone: [phone] Fax: [phone]    END OF SESSION:  PT End of Session - 05/31/23 0756     Visit Number 44    Number of Visits 46    Date for PT Re-Evaluation 06/14/23    Authorization Type [employer]    Authorization Time Period 60VL no auth required    Progress Note Due on Visit 20    PT Start Time 0800    PT Stop Time 0845    PT Time Calculation (min) 45 min    Activity Tolerance Patient tolerated treatment well    Behavior During Therapy WFL for tasks assessed/performed                 Past Medical History:  Diagnosis Date   Anxiety    Diabetes mellitus without complication (HCC)    GERD (gastroesophageal reflux disease)    Hypertension    Obesity (BMI 30-39.9)    Plantar fasciitis of right foot    Skin lesion    Tendonitis    Traumatic tear of left rotator cuff 05/20/2016   Past Surgical History:  Procedure Laterality Date   SHOULDER ACROMIOPLASTY Right 11/23/2022   Procedure: RIGHT SHOULDER ACROMIOPLASTY;  Surgeon: Wilhelmenia Harada, MD;  Location: [facility];  Service: Orthopedics;  Laterality: Right;   SHOULDER ARTHROSCOPY WITH ROTATOR CUFF REPAIR AND SUBACROMIAL DECOMPRESSION Left 05/21/2016   Procedure: SHOULDER ARTHROSCOPY WITH ROTATOR CUFF REPAIR AND SUBACROMIAL DECOMPRESSION LABRAL DEBRIDEMENT;  Surgeon: Elly Habermann, MD;  Location: [facility];  Service: Orthopedics;  Laterality: Left;   VASECTOMY     Patient Active Problem List   Diagnosis Date Noted   Traumatic complete tear of right rotator cuff 11/23/2022   Lab test positive for detection of COVID-19 virus 02/23/2022   Paresthesia 10/23/2018   Traumatic tear of left rotator cuff 05/20/2016   Family history of hypertrophic cardiomyopathy 21-Aug-2014   Family history of sudden cardiac death in son 2014-08-21    PCP: Ronna Coho, MD  REFERRING PROVIDER: Wilhelmenia Harada, MD  REFERRING DIAG: [phone] (ICD-10-CM) - Traumatic complete tear of right rotator cuff, initial encounter  THERAPY DIAG:  Stiffness of right shoulder, not elsewhere classified  Right shoulder pain, unspecified chronicity  Muscle weakness (generalized)  Rationale for Evaluation and Treatment: Rehabilitation  ONSET DATE: 11/23/22 rotator cuff repair, superior capsular reconstruction, acromioplasty  SUBJECTIVE:  SUBJECTIVE STATEMENT: Pt is doing great, its a little sensitive to touch. I was sore after the previous visit.  Sees MD tomorrow.  This is my last visit.   EVAL: In August pt was working on a ladder, fell backwards but unsure quite how he landed. States he did have a mild concussion but that has since resolved. Previously independent and active for work. Since surgery on 11/23/22, has been receiving assistance from wife and kids for daily tasks. Sleeping in recliner with sling and pillow under arm. Icing 3-4x/day ~35min.  Hand dominance: Right  PERTINENT HISTORY: BIL traumatic RC tears, anxiety, DM, GERD, HTN H/O Lt RCR 2018  Recent note : MD  01/05/23: 6-week status post right shoulder superior capsular reconstruction overall doing very well.  At this time I discussed I would like him to continue through the  active range of motion protocol and to work on overhead active range of motion.  Overall he is doing quite well for this stage.  I will plan to see him back in 6 weeks for reassessment   PAIN:  Are you having pain: none/10 today  Location/description: occasional burning and popping; R shoulder, superiorly Best-worst over past week: 0-7/10  - aggravating factors: sleeping and mornings,  - Easing factors: sling, medication    PRECAUTIONS: s/p R RCR 11/23/22; using Dr. Hermina Loosen rotator cuff repair protocol per op note   WEIGHT BEARING RESTRICTIONS: Yes NWB surgical limb  FALLS:  Has patient fallen in last 6 months? Yes. 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 Lifts and carries up to ~50#  PLOF: Independent  PATIENT GOALS: get back to playing golf, shooting pool  NEXT MD VISIT: 06/01/23  OBJECTIVE:  Note: Objective measures were completed at Evaluation unless otherwise noted.  DIAGNOSTIC FINDINGS:  S/p shoulder surgery 11/23/22 Panel 1: Right RIGHT SHOULDER ARTHROSCOPY WITH SUPERIOR CAPSULAR RECONSTRUCTION / EXTENSIVE DEBRIDEMENT with Wilhelmenia Harada, MD  Panel 1: Right RIGHT SHOULDER ACROMIOPLASTY with Wilhelmenia Harada, MD   PATIENT SURVEYS:  FOTO 29>64 predicted Visit 8 , 12/23/22 : 56%  Visit 16: 01/27/23: NA Visit 23: 02/22/23: 58%:  Visit 38% 04/19/23: 75% DC   COGNITION: Overall cognitive status: Within functional limits for tasks assessed     SENSATION/OBSERVATION: Surgical site obscured by bandaging Pt denies any sensory complaints since nerve block wore off  POSTURE: Sling RUE, increased R UT elevation  UPPER EXTREMITY ROM:  ROM  Rt. 02/22/23 Rt.  03/10/23 RT 04/12/23 Rt 04/19/23 Rt  05/02/23 Rt.  05/10/23 Rt 05/31/23  Shoulder flexion  153 155 heavy comp 155 Min comp 164 165 168 170  Shoulder abduction 100    165  168 170  Shoulder internal rotation Thumb at T12 hand to lumbar  At 90 deg abd supine 80  T8 functional reach T9 FR  T8  Shoulder external rotation Fingers to T1 At 90 deg abd supine 65  T1 Functional reach  T1 FR  T2  Elbow flexion         Elbow extension         Wrist flexion         Wrist extension          (Blank rows = not tested) (  Key: WFL = within functional limits not formally assessed, * = concordant pain, s = stiffness/stretching sensation, NT = not tested)  Comments: deferred given acuity of surgery, phase 1 on eval, elbow flex/ext AAROM grossly WNL  UPPER EXTREMITY MMT:  MMT Rt 03/08/23 Rt.  04/19/23 Rt 05/31/23  Shoulder flexion 3/5 3+/5 3+/5  but in early range of flexion 4+/5   Shoulder extension     Shoulder abduction 4/5 4-/5 4/5  Shoulder extension     Shoulder internal rotation  5/5 5/5  Shoulder external rotation  4/5 4-/5 in 90 deg abd 4/5  Elbow flexion     Elbow extension     Grip strength      (Blank rows = not tested)  (Key: WFL = within functional limits not formally assessed, * = concordant pain, s = stiffness/stretching sensation, NT = not tested) Comments: deferred given acuity of surgery  SHOULDER SPECIAL TESTS: Deferred given acuity of surgery  JOINT MOBILITY TESTING:  Deferred given acuity of surgery  [facility] Adult PT Treatment:                                                DATE: 05/31/23 Therapeutic Exercise: UBE 6 min level 5  Adduction blue band  Flexion blue band  ER at abduction blue band (goal post)  Sidelying ER, scaption and reverse fly 5 lbs  Quadruped flexion 3 lbs scaption Rt UE  Triceps press x  15  OH press double arm 10 lbs x 10  Goal post at wall x 10  Hanging joint distraction cross body, abduction and OH Wall for support: red looped  band OH lift (narrow) , ER  Supine flexion  5 lbs 2 x 10  Therapeutic Activity: MMT and AROM Goal check    [facility] Adult PT Treatment:                                                DATE: 05/24/23 Therapeutic Exercise: Seated row single arm 25# 15 x 2  Seated chest press single arm 10# , 15#  x 15 each  Seated tricep press 20# 10 x 2  OH press 10# using both arms 10 x 2  Standing D2 Flex AROM x 10 Standing long lever strength Rt UE , 3-4 lbs x 10 x 2 and lateral raise as well  Inclined horiz abdct blue 10 x 2  Inclined D2 Flex 10 x 2  Inclined shoulder flexion long lever 4# 10 x 2  S/L shoulder abduction 4# 10 x 2  Standing wall wash OH 2# on wrist ,  1 minute and 15 sec tolerated prior to fatigue Wall push up 10 x 2  PROM right shoulder     [facility] Adult PT Treatment:                                                DATE: 05/17/23 Therapeutic Exercise: UBE reverse L5 for 5 min  Row 15 lbs, 25 lbs x 12-15 each  Upright row 25 lbs x 12 Overhead reaching (unable to do 5 lbs),  taken to the wall with 2 lbs, slide with lift off  Sidelying ER: 5 lbs x 12, 3 lbs x 12  Reverse fly: 4 lbs x 10 x 2 sets  Scaption 4 lbs x 10 x 2  Standing long lever strength Rt UE , 3-4 lbs x 10 x 2 and lateral raise as well  Walking with 2 lbs overhead 2 x 75 lbs  Walking 180 feet with 5 lbs in front with bent elbow Quadruped scap protract/retract double and single arm Monster plank x 30 sec  PROM all planes with contract relax for IR    [facility] Adult PT Treatment:                                                DATE: 05/10/23  UBE level 3, 3 min each direction  Supine and sidelying: strengthening with dumbbells Flexion 4 lbs (bent elbow and elbow extended)  5 lbs narrow press x 10 x 2 sets  Skull crusher 5 lbs x 15 Circles (small) 5 lbs x 20  Sidelying flexion 3 lbs x 15  Abduction, scaption x 4 lbs x 10 x 2  ER 3 lbs x  15 , 4 lbs x 10  Standing diagonal (red band) heavy comps Single arm pull   Bilateral black upright row , bicep curl x 15 each standing  Green looped band x 15 against the wall Serratus lift Flexion with green looped band elbows extended x 2 x 8 Dowel mobility: extension, IR and abduction x 10 to tolerance   [facility] Adult PT Treatment:                                                DATE: 05/02/23 Therapeutic Exercise: UBE level 3 , 3 min each direction  Row narrow and wide  ER in abduction  Elevation red band x 15  Looped blue band press out in neutral  Added overhead lift x 10 full ROM  Closed chain shoulder work at wall- flexion difficulty CCK in Quadruped: flexion, abduction 2-3 lbs 2 x 10  Low plank on BOSU mat table 45 sec added small range mtn. Climber  High plank on high mat alternating arm lift  Supine flexion 3 lbs x 15, 4 lbs x 10 and 5 lbs x 10  Skull crusher 5 lbs x 15 Rt UE biceps with ER/IR x 15   [facility] Adult PT Treatment:                                                DATE: 04/26/23 Therapeutic Exercise: RT UE only L 5-9 for 4 min  Therapeutic Activity: Springboard/Slastix: ER and IR with shoulder flexion Shoulder elevation slastix  Shoulder row and extension with roll back bar  Supine GTB chest press added scapular retract/protract Supine red long band flexion and scaption  Supine Rt shoulder narrow press 2 lbs 2 x 10  Skull crusher 4 lbs x 20  Sidelying ER  3 # 2 x 10 Sidelying Abduction (elbow flexed) 2 x 10  Standing wall rebounding with tennis ball and  small green ball overhead, abduction Bilateral UE ball overhead with alt UE lift in full flexion.  Double arm "clean" to chest with 25 lbs KB Single arm clean 15 lbs  Bent over Row x 15, 15 lbs , x 10 with 25 lbs   [facility] Adult PT Treatment:                                                DATE: 04/19/23 Therapeutic Exercise: NuStep UE only level 8, 6 min  Therapeutic Activity: MMT, ROM measurement  Row 4 plates x 20  ER 1 plate x 15 Flexion 3 x 10, 1 plate  30 sec x 3 Rt UE 5 lbs  overhead hold static standing  Straight arm pull down slastix  Lower trap activation using wall and foam roller  Shoulder flexion AROM Rt UE at wall unable to get full ROM due to fatigue   04/14/23:  Therapeutic Activity: UBE Level 3 x 5 min Pulleys  Standing shoulder flexion AROM x 10 (failure)  Right shouler abduction x 2 (failure) Standing cane AA shoulder abduction with eccentric lowering x 10  Attempted belt to depress shoulder with AROM Right shoulder Row cable 23# 15 x 2  13# right pulldown right 20 x 1 ER Blue 10 x 2  Standing horiz abdct Blue band attached  Reclined: right PNF x 15 Reclined 2# shoulder flexion x 10, 3# x 10 Side shoulder abdct 4# 2 x 10  Side ER 3# 10 x2 Prone scap retract + ext 10 x 2  Prone horiz abdct thumbs up Prone Right ER from 90 degrees 10 x 2 2# thumb in and thumb up Modalities: Ice pack post session to decrease soreness  PATIENT EDUCATION: Education details: Pt education on PT impairments, prognosis, and POC. Informed consent. Rationale for interventions, safe/appropriate HEP performance Person educated: Patient Education method: Explanation, Demonstration, Tactile cues, Verbal cues, and Handouts Education comprehension: verbalized understanding, returned demonstration, verbal cues required, tactile cues required, and needs further education    HOME EXERCISE PROGRAM:  Access Code: [email or web address] URL: [email or web address] Date: 01/14/2023 Prepared by: Marci Setter  Program Notes - with seated elbow flexion, work on gently straightening and bending the elbow with the support of the other arm  Exercises - Standing Elbow Flexion with Resistance  - 1 x daily - 7 x weekly - 2 sets - 10 reps - 5 hold - Standing Shoulder External Rotation Stretch in Doorway  - 1 x daily - 7 x weekly - 2 sets - 10 reps - 10-20 hold - Standing Shoulder Abduction AAROM with Dowel  - 1 x daily - 7 x weekly - 2 sets - 10 reps - 10 hold - Standing Shoulder  Extension with Dowel  - 1 x daily - 7 x weekly - 2 sets - 10 reps - 10 hold - Standing Shoulder Flexion AAROM with Dowel  - 1 x daily - 7 x weekly - 2 sets - 10 reps - 10 hold - Standing Shoulder Internal Rotation AAROM with Dowel  - 1 x daily - 7 x weekly - 2 sets - 10 reps - 10 hold - Corner Pec Major Stretch  - 1-2 x daily - 7 x weekly - 1 sets - 5 reps - 30 hold - Standing Lat Pull Down with Resistance - Elbows Bent  - 1 x daily -  7 x weekly - 2 sets - 10 reps - 5 hold - Sidelying Shoulder ER with Towel and Dumbbell  - 1 x daily - 7 x weekly - 2 sets - 10 reps - 2 hold - Sidelying Shoulder Horizontal Abduction  - 1 x daily - 7 x weekly - 2 sets - 10 reps - 2 hold - Sidelying Shoulder Scaption  - 1 x daily - 7 x weekly - 2 sets - 10 reps - 2 hold    ASSESSMENT:  CLINICAL IMPRESSION:   Patient has finished PT episode for Rt shoulder with overall great outcome. He continues to have weakness in shoulder flexion and abduction.   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 His AROM is excellent, see above for details. He sees Dr. Hermina Loosen tomorrow.  He is discharged at this time, will cont to work on strength independently .        OBJECTIVE IMPAIRMENTS: decreased activity tolerance, decreased endurance, decreased mobility, decreased ROM, decreased strength, impaired perceived functional ability, impaired flexibility, impaired UE functional use, postural dysfunction, and pain.   ACTIVITY LIMITATIONS: carrying, lifting, bending, sleeping, transfers, bathing, toileting, dressing, self feeding, reach over head, and hygiene/grooming  PARTICIPATION LIMITATIONS: meal prep, cleaning, laundry, driving, shopping, community activity, occupation, and yard work  PERSONAL FACTORS: Time since onset of injury/illness/exacerbation and 3+ comorbidities: anxiety, DM, HTN are also affecting patient's functional outcome.   REHAB POTENTIAL: Good  CLINICAL DECISION MAKING:  Stable/uncomplicated  EVALUATION COMPLEXITY: Low   GOALS: Goals reviewed with patient? No  SHORT TERM GOALS: Target date: 01/06/2023 Pt will demonstrate appropriate understanding and performance of initially prescribed HEP in order to facilitate improved independence with management of symptoms.  Baseline: HEP provided on eval Goal status:MET   2. Pt will score greater than or equal to 45 on FOTO in order to demonstrate improved perception of function due to symptoms.  Baseline: 29  Goal status: MET  LONG TERM GOALS: Target date: 02/17/2023   Pt will score 64 on FOTO in order to demonstrate improved perception of function due to symptoms. Baseline: 29, 56%  Goal status: ongoing   2.  Pt will demonstrate at least 150 degrees of active shoulder elevation in order to demonstrate improved tolerance to functional movement patterns such as overhead reaching. Baseline: can do this with heavy compensation  Goal status: MET   3.  Pt will demonstrate at least 4+/5 shoulder flex/abduction MMT for improved symmetry of UE strength and improved tolerance to functional movements.  Baseline: deferred on eval given acuity of surgery See above  Goal status:Partially met   4. Pt will report/demonstrate ability to perform upper body dressing with less than 2 point increase in pain on NPS in order to indicate improved tolerance/independence to ADLs.  Baseline: unable to perform, requiring family assist  Goal status:MET   5. Pt will be able to lift at least 40# from floor to waist with less than 2 pt increase in pain in order to facilitate improved tolerance to work tasks.  Baseline: updated 04/19/23  Goal status: MET   6. Pt will be able to return to work without limitation of pain or functional (driving, pushing and pulling materials 40-50 lbs)   Baseline: able to drive without issues, not lifting on restriction  Goal status: MET, is still on light duty  7. Pt will be able to use his Rt UE while  driving, reaching with ease   Baseline: difficulty reaching back and  to the radio  Goal status : MET   8.  Pt will be able to use his Rt UE while working overhead for 5 min with no difficulty   Baseline:  min to mod difficulty 2 min   Goal status MET, tiring  PLAN:  PT FREQUENCY: 2x/week  PT DURATION: 8 weeks  PLANNED INTERVENTIONS: 97164- PT Re-evaluation, 97110-Therapeutic exercises, 97530- Therapeutic activity, 97112- Neuromuscular re-education, 97535- Self Care, 16109- Manual therapy, 97014- Electrical stimulation (unattended), 97016- Vasopneumatic device, Patient/Family education, Balance training, Stair training, Taping, Dry Needling, Joint mobilization, Spinal mobilization, Scar mobilization, Cryotherapy, and Moist heat  PLAN FOR NEXT SESSION:   NA , DC   Marci Setter, PT 05/31/23 7:57 AM Phone: [phone] Fax: [phone]

## 2023-06-01 ENCOUNTER — Ambulatory Visit (HOSPITAL_BASED_OUTPATIENT_CLINIC_OR_DEPARTMENT_OTHER): Admitting: Orthopaedic Surgery

## 2023-06-01 DIAGNOSIS — S46011A Strain of muscle(s) and tendon(s) of the rotator cuff of right shoulder, initial encounter: Secondary | ICD-10-CM | POA: Diagnosis not present

## 2023-06-01 NOTE — Progress Notes (Signed)
 Post Operative Evaluation    Procedure/Date of Surgery: Right shoulder superior capsular reconstruction 11/5  Interval History:   Presents today 6 months status post the above procedure.  Overall he is doing extremely well.  Range of motion is coming nicely as well as strengthening.  He is back to golfing without any pain.  PMH/PSH/Family History/Social History/Meds/Allergies:    Past Medical History:  Diagnosis Date   Anxiety    Diabetes mellitus without complication (HCC)    GERD (gastroesophageal reflux disease)    Hypertension    Obesity (BMI 30-39.9)    Plantar fasciitis of right foot    Skin lesion    Tendonitis    Traumatic tear of left rotator cuff 05/20/2016   Past Surgical History:  Procedure Laterality Date   SHOULDER ACROMIOPLASTY Right 11/23/2022   Procedure: RIGHT SHOULDER ACROMIOPLASTY;  Surgeon: Wilhelmenia Harada, MD;  Location: [facility];  Service: Orthopedics;  Laterality: Right;   SHOULDER ARTHROSCOPY WITH ROTATOR CUFF REPAIR AND SUBACROMIAL DECOMPRESSION Left 05/21/2016   Procedure: SHOULDER ARTHROSCOPY WITH ROTATOR CUFF REPAIR AND SUBACROMIAL DECOMPRESSION LABRAL DEBRIDEMENT;  Surgeon: Elly Habermann, MD;  Location: [facility];  Service: Orthopedics;  Laterality: Left;   VASECTOMY     Social History   Socioeconomic History   Marital status: Married    Spouse name: Not on file   Number of children: 4   Years of education: Not on file   Highest education level: Not on file  Occupational History   Occupation: [employer]  Tobacco Use   Smoking status: Former    Types: Cigarettes   Smokeless tobacco: Former    Types: Snuff  Vaping Use   Vaping status: Never Used  Substance and Sexual Activity   Alcohol use: Yes    Alcohol/week: 0.0 standard drinks of alcohol    Comment: Occas   Drug use: No   Sexual activity: Not on file  Other Topics Concern   Not on file  Social History  Narrative   Not on file   Social Drivers of Health   Financial Resource Strain: Not on file  Food Insecurity: Not on file  Transportation Needs: Not on file  Physical Activity: Not on file  Stress: Not on file  Social Connections: Not on file   Family History  Problem Relation Age of Onset   Hypertrophic cardiomyopathy Son    Diabetes type I Son    No Known Allergies Current Outpatient Medications  Medication Sig Dispense Refill   aspirin  EC 325 MG tablet Take 1 tablet (325 mg total) by mouth daily. 14 tablet 0   atorvastatin (LIPITOR) 40 MG tablet Take 40 mg by mouth daily.     JARDIANCE 25 MG TABS tablet Take 25 mg by mouth daily.     lisinopril (ZESTRIL) 10 MG tablet Take 10 mg by mouth daily.     metFORMIN (GLUCOPHAGE) 1000 MG tablet Take 1,000 mg by mouth 2 (two) times daily.     oxyCODONE  (ROXICODONE ) 5 MG immediate release tablet Take 1 tablet (5 mg total) by mouth every 4 (four) hours as needed for severe pain (pain score 7-10) or breakthrough pain. (Patient not taking: Reported on 03/31/2023) 15 tablet 0   sertraline (ZOLOFT) 100 MG tablet Take 200 mg by mouth daily.  Tirzepatide (MOUNJARO [place]) Inject into the skin once a week.     No current facility-administered medications for this visit.   No results found.  Review of Systems:   A ROS was performed including pertinent positives and negatives as documented in the HPI.   Musculoskeletal Exam:     Right shoulder is well-appearing without erythema or drainage around surgery sites.  Active forward elevation is to 160 actively, external rotation at the side is to 50 degrees equal to the contralateral side.  Internal rotation is to L1.   Imaging:      I personally reviewed and interpreted the radiographs.   Assessment:   6 months status post right shoulder superior capsular reconstruction overall doing very well.  Overall he is doing extremely well.  The strength is coming along nicely.  He is back to full  activity.  I will plan to see him back as needed  Plan :    -Return to clinic as needed      I personally saw and evaluated the patient, and participated in the management and treatment plan.  Wilhelmenia Harada, MD Attending Physician, Orthopedic Surgery  This document was dictated using Dragon voice recognition software. A reasonable attempt at proof reading has been made to minimize errors.

## 2023-11-21 ENCOUNTER — Encounter: Payer: Self-pay | Admitting: Radiology
